# Patient Record
Sex: Female | Born: 1965 | Race: White | Hispanic: No | Marital: Married | Smoking: Former smoker
Health system: Southern US, Community
[De-identification: ages and names within clinical notes are randomized; demographics above are authoritative.]

## PROBLEM LIST (undated history)

## (undated) DIAGNOSIS — F419 Anxiety disorder, unspecified: Secondary | ICD-10-CM

## (undated) DIAGNOSIS — K219 Gastro-esophageal reflux disease without esophagitis: Secondary | ICD-10-CM

## (undated) DIAGNOSIS — I739 Peripheral vascular disease, unspecified: Secondary | ICD-10-CM

## (undated) DIAGNOSIS — I2510 Atherosclerotic heart disease of native coronary artery without angina pectoris: Secondary | ICD-10-CM

## (undated) DIAGNOSIS — E119 Type 2 diabetes mellitus without complications: Secondary | ICD-10-CM

## (undated) DIAGNOSIS — I509 Heart failure, unspecified: Secondary | ICD-10-CM

## (undated) DIAGNOSIS — Z87442 Personal history of urinary calculi: Secondary | ICD-10-CM

## (undated) HISTORY — PX: TONSILLECTOMY: SUR1361

## (undated) HISTORY — PX: CORONARY ANGIOPLASTY WITH STENT PLACEMENT: SHX49

## (undated) HISTORY — PX: SALPINGOOPHORECTOMY: SHX82

## (undated) HISTORY — PX: TUBAL LIGATION: SHX77

## (undated) HISTORY — PX: KIDNEY STONE SURGERY: SHX686

## (undated) HISTORY — PX: BACK SURGERY: SHX140

---

## 1999-04-11 ENCOUNTER — Ambulatory Visit (HOSPITAL_COMMUNITY): Admission: RE | Admit: 1999-04-11 | Discharge: 1999-04-11 | Payer: Self-pay | Admitting: Neurosurgery

## 1999-04-11 ENCOUNTER — Encounter: Payer: Self-pay | Admitting: Neurosurgery

## 1999-04-27 ENCOUNTER — Encounter: Payer: Self-pay | Admitting: Neurosurgery

## 1999-04-27 ENCOUNTER — Inpatient Hospital Stay (HOSPITAL_COMMUNITY): Admission: RE | Admit: 1999-04-27 | Discharge: 1999-04-28 | Payer: Self-pay | Admitting: Neurosurgery

## 1999-05-23 ENCOUNTER — Encounter: Admission: RE | Admit: 1999-05-23 | Discharge: 1999-05-23 | Payer: Self-pay | Admitting: Urology

## 1999-05-23 ENCOUNTER — Encounter: Payer: Self-pay | Admitting: Urology

## 1999-06-13 ENCOUNTER — Inpatient Hospital Stay (HOSPITAL_COMMUNITY): Admission: RE | Admit: 1999-06-13 | Discharge: 1999-06-14 | Payer: Self-pay | Admitting: Urology

## 1999-06-13 ENCOUNTER — Encounter: Payer: Self-pay | Admitting: Urology

## 1999-06-14 ENCOUNTER — Encounter: Payer: Self-pay | Admitting: Urology

## 1999-08-09 ENCOUNTER — Encounter: Admission: RE | Admit: 1999-08-09 | Discharge: 1999-08-09 | Payer: Self-pay | Admitting: Urology

## 1999-08-09 ENCOUNTER — Encounter: Payer: Self-pay | Admitting: Urology

## 1999-11-19 ENCOUNTER — Encounter: Admission: RE | Admit: 1999-11-19 | Discharge: 1999-11-19 | Payer: Self-pay | Admitting: Urology

## 1999-11-19 ENCOUNTER — Encounter: Payer: Self-pay | Admitting: Urology

## 1999-12-06 ENCOUNTER — Encounter: Payer: Self-pay | Admitting: Urology

## 1999-12-06 ENCOUNTER — Ambulatory Visit (HOSPITAL_COMMUNITY): Admission: RE | Admit: 1999-12-06 | Discharge: 1999-12-06 | Payer: Self-pay | Admitting: Urology

## 1999-12-21 ENCOUNTER — Encounter: Admission: RE | Admit: 1999-12-21 | Discharge: 1999-12-21 | Payer: Self-pay | Admitting: Urology

## 1999-12-21 ENCOUNTER — Encounter: Payer: Self-pay | Admitting: Urology

## 2000-03-24 ENCOUNTER — Encounter: Payer: Self-pay | Admitting: Urology

## 2000-03-24 ENCOUNTER — Encounter: Admission: RE | Admit: 2000-03-24 | Discharge: 2000-03-24 | Payer: Self-pay | Admitting: Urology

## 2002-04-20 ENCOUNTER — Inpatient Hospital Stay (HOSPITAL_COMMUNITY): Admission: EM | Admit: 2002-04-20 | Discharge: 2002-04-21 | Payer: Self-pay | Admitting: Cardiology

## 2002-06-17 ENCOUNTER — Encounter: Payer: Self-pay | Admitting: Cardiology

## 2002-06-17 ENCOUNTER — Inpatient Hospital Stay (HOSPITAL_COMMUNITY): Admission: AD | Admit: 2002-06-17 | Discharge: 2002-06-21 | Payer: Self-pay | Admitting: Cardiology

## 2002-09-01 ENCOUNTER — Encounter: Payer: Self-pay | Admitting: Emergency Medicine

## 2002-09-01 ENCOUNTER — Emergency Department (HOSPITAL_COMMUNITY): Admission: EM | Admit: 2002-09-01 | Discharge: 2002-09-01 | Payer: Self-pay | Admitting: Emergency Medicine

## 2002-12-28 ENCOUNTER — Encounter: Payer: Self-pay | Admitting: Emergency Medicine

## 2002-12-28 ENCOUNTER — Emergency Department (HOSPITAL_COMMUNITY): Admission: EM | Admit: 2002-12-28 | Discharge: 2002-12-28 | Payer: Self-pay | Admitting: Emergency Medicine

## 2008-06-06 ENCOUNTER — Encounter
Admission: RE | Admit: 2008-06-06 | Discharge: 2008-06-06 | Payer: Self-pay | Admitting: Physical Medicine and Rehabilitation

## 2008-07-04 ENCOUNTER — Encounter
Admission: RE | Admit: 2008-07-04 | Discharge: 2008-07-04 | Payer: Self-pay | Admitting: Physical Medicine and Rehabilitation

## 2010-08-31 NOTE — Cardiovascular Report (Signed)
NAME:  Isabel [name], HUBERS                          ACCOUNT NO.:  [id number]   MEDICAL RECORD NO.:  [id number]                   PATIENT TYPE:  INP   LOCATION:  2008                                 FACILITY:  [facility]   PHYSICIAN:  Veneda Melter, M.D. [facility]               DATE OF BIRTH:  Mar 19, 1966   DATE OF PROCEDURE:  06/18/2002  DATE OF DISCHARGE:                              CARDIAC CATHETERIZATION   PROCEDURE PERFORMED:  1. Left heart catheterization.  2. Left ventriculogram.  3. Selective coronary angiography.   DIAGNOSES:  1. Coronary atherosclerotic disease.  2. Patent stent in the proximal left anterior descending artery.  3. Normal left ventricular systolic function.   HISTORY:  The patient is a 45 year old white female with aggressive coronary  artery disease who has recently undergone coronary artery intervention with  placement of 2 stents in the proximal LAD in January 2004.  The patient  presents now with recurrent substernal chest discomfort.  This is somewhat  different than her prior pain; however, stress imaging study was performed  showing mild ischemia in the mid anterior septal wall.  She was referred for  further assessment.   TECHNIQUE:  Informed consent was obtained.  The patient was brought to the  catheterization lab.  A 6-French sheath was placed in the right femoral  artery using the modified Seldinger technique.  JL4 and JR4 6-French  catheters were then used to engage the left and right coronary arteries, and  selective angiography was performed in various projections using manual  injections of contrast.  A 6-French pigtail catheter was advanced to the  left ventricle, and left ventriculogram performed using power injections of  contrast.  At the termination of the case, the catheters and sheath were  removed, and manual pressure applied until adequate hemostasis was achieved.  The patient tolerated the procedure well and was transferred to the floor in  stable condition.  Findings are as follows:   FINDINGS:  1. Left main trunk:  Medium caliber vessel with mild irregularities.  2. LAD:  This is a medium caliber vessel that provides a diagonal branch in     the mid section.  The LAD has ostial narrowing of 30%.  There is then     evidence of 2 stents in the proximal segment encompassing a septal     perforator and extending up to the diagonal branch.  There is mild waist     within the proximal and distal segments of the stent of not greater than     20%.  The diagonal branches of the distal LAD have mild irregularities.     The small septal perforator has a pinch at its origin of approximately     60%, but it appears to be filling adequately with TIMI grade 3 flow.  3. Left circumflex artery:  This is a small caliber vessel that provides a  marginal branch in the mid section.  The left circumflex system has mild     narrowing of 30% at the takeoff of the marginal branch.  4. Right coronary:  Dominant.  This is a medium caliber vessel that provides     a posterior descending artery and posterior ventricular branch in the     terminal segment.  The right coronary artery has diffuse disease of 30%     to 40% in the mid section.  5. LV:  Normal end systolic and end diastolic dimensions.  Overall left     ventricular function is well preserved.  Ejection fraction of greater     than 55%.  No mitral regurgitation.    HEMODYNAMICS:  1. LV pressure is 130/0.  2. Aortic is 130/80.  3. LVEDP equals 15.   ASSESSMENT AND PLAN:  The patient is a 45 year old female with coronary  artery disease.  She has patent left anterior descending artery via previous  stent placement and atypical chest pain.  Continued medical therapy will be  pursued.                                               Veneda Melter, M.D. [facility]    NG/MEDQ  D:  06/18/2002  T:  06/19/2002  Job:  [id number]   cc:   [name]  [address]  [place]  [place] [address]   Fax: [phone]   Jonelle Sidle, M.D. [facility]

## 2010-08-31 NOTE — Discharge Summary (Signed)
NAME:  Isabel [name], Isabel [name]                          ACCOUNT NO.:  [id number]   MEDICAL RECORD NO.:  [id number]                   PATIENT TYPE:  INP   LOCATION:  2905                                 FACILITY:  [facility]   PHYSICIAN:  Rollene Rotunda, M.D. [facility]            DATE OF BIRTH:  Dec 02, 1965   DATE OF ADMISSION:  06/17/2002  DATE OF DISCHARGE:  06/21/2002                           DISCHARGE SUMMARY - REFERRING   HISTORY ON ADMISSION:  This is a 45 year old female with a history of  coronary artery disease including unstable angina diagnosed in January of  2004.  At that time, she was transferred from [facility] to [facility], where she had a cardiac catheterization which showed two 90%  proximal lesions of the LAD.  The right coronary artery had a 30% proximal  lesion as well as a 30% distal lesion.  The circumflex was normal.  The  ejection fraction was 65%.  She underwent stenting of both lesions with 0%  residual.  She had a CYPHER 3.5 x 28.0 mm and an Express II Monorail 3.5 x  12.0 mm.  She did relatively well until several weeks ago, when she began  having chest pain.  She was seen in the [place] office by Dr. Rollene Rotunda  and arrangements were made to admit the patient to [facility] for  further evaluation.   PAST MEDICAL HISTORY:  Please see coronary artery disease history as noted  above; the patient also has diabetes mellitus and hyperlipidemia.   ALLERGIES:  No known drug allergies.   PAST SURGICAL HISTORY:  The patient is status post tubal ligation, status  post tumor removed from her uterus, history of surgical resection of renal  calculi, history of lumbar disk surgery.   SOCIAL HISTORY:  The patient is married.  She has children.  She quit  smoking approximately 15 years ago.  She works for [employer].   FAMILY HISTORY:  The patient's father had an MI in his 62s.   HOSPITAL COURSE:  As noted, this patient was admitted to [facility]  for further evaluation of chest pain with known coronary artery disease,  status post percutaneous interventions and stents to the LAD performed in  January of 2004.  The patient underwent cardiac catheterization on June 18, 2002 performed by Dr. Veneda Melter.  The patient was found to have patent  stents of the LAD with normal systolic ejection fraction.  There was other  nonobstructive coronary artery disease noted with 20% and 30% diffuse  lesions.  Continued medical therapy was felt to be indicated.   The patient had some groin problems following the procedure.  She underwent  ultrasound of the groin, which was negative for pseudoaneurysm.  Arrangements were made to discharge the patient on June 21, 2002 in improved  condition.   LABORATORY DATA:  A CBC on the 6th was within  normal limits.  A chemistry  profile on the 4th revealed a BUN of 12, creatinine 0.5, potassium was 3.9,  glucose was 162.  Cardiac enzymes were negative.   A chest x-ray showed no active disease.   An EKG showed normal sinus rhythm with poor R wave progression, rate 83  beats per minute.    DISCHARGE MEDICATIONS:  1. Glucotrol XL 5 mg b.i.d.  2. Glucophage 500 mg b.i.d.  3. Enteric-coated aspirin 81 mg daily.  4. Zocor 40 mg daily.  5. Plavix 75 mg daily.  6. Lopressor 25 mg b.i.d.   ACTIVITY:  The patient was told to avoid any strenuous activity or driving  for two days.  She was told not to lift more than 10 pounds for 1 week.   DIET:  She is to be on a low-salt, low-fat diabetic diet.   SPECIAL DISCHARGE INSTRUCTIONS:  She was told to call the office if she had  any increased pain, swelling or bleeding from her groin.   FOLLOWUP:  She was to see Dr. [name] as needed or as scheduled.  She  was to follow up in the [facility] in [place] in approximately  two weeks; the office will call her for an appointment.   PROBLEM LIST AT TIME OF DISCHARGE:  1. Known coronary  artery disease as documented above with left anterior     descending stents performed in January of 2004.  2. Diabetes mellitus.  3. Hyperlipidemia.  4. Recent chest pain with negative enzymes.  5. Cardiac catheter this admission revealing patent stent and nonobstructive     coronary artery disease with normal left ventricular function, continued     medical therapy recommended.     Delton See, P.A. [facility]                  Rollene Rotunda, M.D. [facility]    DR/MEDQ  D:  06/21/2002  T:  06/21/2002  Job:  [id number]   cc:   [name]  [address]  [place]  [place] [address]  Fax: [phone]   [address] [place]  [address] [facility]

## 2010-08-31 NOTE — Discharge Summary (Signed)
NAME:  Isabel [name], Isabel [name] NO.:  [id number]   MEDICAL RECORD NO.:  [id number]                   PATIENT TYPE:  INP   LOCATION:  6527                                 FACILITY:  [facility]   PHYSICIAN:  Jonelle Sidle, M.D. [facility]        DATE OF BIRTH:  07-Jan-1966   DATE OF ADMISSION:  04/20/2002  DATE OF DISCHARGE:                           DISCHARGE SUMMARY - REFERRING   HISTORY OF PRESENT ILLNESS:  Briefly, this is a 45 year old female who was  seen at [facility] emergency room for evaluation of chest pain.  The  patient has a history of uncontrolled diabetes X2 years as well as untreated  hyperlipidemia.  She has been noncompliant.  She has a positive family  history of premature coronary artery disease as well as a remote tobacco  history.  Initial cardiac enzymes were found to be negative although her  glucose was elevated at 344.  She was seen in consultation by Dr.  Diona Browner  and arrangements were made to transfer the patient to [facility] for further evaluation of chest pain.   PAST MEDICAL HISTORY:  As noted, the patient has a history of untreated  hyperlipidemia, history of diabetes mellitus which has not been well  controlled with diet, positive family history of coronary artery disease.  The patient quit smoking 12 years ago but smoked approximately 15 years  prior to that.  She is overweight and gets no regular exercise.   ALLERGIES:  No known drug allergies.   SOCIAL HISTORY:  The patient is married.  She has two children.  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  She quit smoking 12 years ago.  She smoked one pack per day for 15 years.  She drinks alcohol socially.   FAMILY HISTORY:  The patient's father had a myocardial infarction in his  40's.   HOSPITAL COURSE:  As noted, this patient was transferred to [facility] for further evaluation of chest  pain after being seen in  the emergency department at [facility].  Initial cardiac enzymes were  negative.  The patient underwent cardiac catheterization  on the day of  admission April 20, 2002, performed by Dr. Samule Ohm.  The patient was found  to have two 90% proximal lesions in the left anterior descending.  The  circumflex was essentially normal.  The right coronary artery had 30%  proximal lesion as well as a 30% distal  lesion.  Ejection fraction was  estimated to be 65% with no regional wall motion abnormalities and no MR. No  AS, no MS.  The patient underwent stenting of the proximal left anterior  descending lesions; both 90% lesions were reduced to 0.  The patient  tolerated this well.  Arrangements were made to discharge the patient on  April 21, 2002 in improved and stable condition.   During the  patient's stay, her glucose was noted to be elevated in the 300  to 400 range.  As noted the patient has a previous history of diabetes  mellitus which had been controlled through diet, however, it is believed the  patient has been noncompliant and there has not been adequate control.  She  was started on Glucotrol prior to discharge from the hospital and asked to  follow up with her primary care physician as soon as possible for further  treatment of her diabetes.  She will also be referred for outpatient  diabetic teaching.   LABORATORY DATA:  A CBC on April 21, 2002 revealed hemoglobin 13.7,  hematocrit 39.4, white blood cell count 7,000, platelet count 202,000.  Chemistries on April 21, 2002 reveal BUN of 7, creatinine 0.4, potassium  3.8, sodium 134, glucose 325.  Cardiac enzymes were negative.  Hemoglobin  A1C is currently pending.  A lipid profile is pending.  INR was 1.0.  A TSH  at [facility] was 1.10.  A chemistry profile at [facility] revealed BUN  of 9, creatinine 0.4, potassium 3.6, glucose 344.  Cardiac enzymes were  negative.  A lipid profile revealed  cholesterol 376, triglycerides 923.  Apparently chest x-ray was pending at the time of transfer from [facility].   DISCHARGE MEDICATIONS:  1. Zocor 40 mg q.h.s.  2. Lopressor 50 mg b.i.d.  3. Enteric coated aspirin 81 mg daily.  4. Plavix 75 mg daily for three months.  5. Nitroglycerin as needed for chest pain.  6. Glucotrol XL 5 mg each morning.   The patient was on no medications prior to admission.   DISCHARGE INSTRUCTIONS:  Patient was told to avoid any strenuous activity or  driving for at least two days.   DIET:  Patient is to be on a low salt, low fat, diabetic diet.   FOLLOW UP:  Patient is told to call the [place] office for any increased  pain, swelling or bleeding of her groin.  She is to have a fasting lipid  profile and liver function tests in six to eight weeks.  She is to follow up  with the [facility] April 29, 2002, Thursday, at 2 PM.  The patient  was told to call Dr. Dimas Aguas for an appointment as soon as possible for  follow up of her diabetes.  She is to have outpatient diabetic teaching.   PROBLEM LIST AT TIME OF DISCHARGE:  1. Coronary artery disease, status post stents X2 to the left anterior     descending performed April 20, 2002 with minimal residual coronary     artery disease and ejection fraction of 65%.  2. Diabetes mellitus untreated prior to admission.  3. Hyperlipidemia, untreated prior to admission.  4. Remote tobacco history.  5. Positive family history of coronary artery disease.  6. Status post multiple surgeries.  7. History of noncompliance.     Delton See, P.A. [facility]                  Jonelle Sidle, M.D. [facility]    DR/MEDQ  D:  04/21/2002  T:  04/21/2002  Job:  [id number]   cc:   [facility]  [address]  [address]  [place], [place] [address]   [name]  [address]  [place]  [place] [address]  Fax: [phone]

## 2010-08-31 NOTE — Op Note (Signed)
[place]  Patient:    Isabel [name]                          MRN: [id number] Proc. Date: 04/27/99 Adm. 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  PROCEDURE:  Left L5 hemilaminectomy with removal of free fragment and total diskectomy.  Decompression of the S1 nerve root.  SURGEON:  Tanya Nones. Jeral Fruit, M.D.  ASSISTANT:  Alanson Aly. Roxan Hockey, M.D.  CLINICAL HISTORY:  The patient was seen by me a month ago because of back and left leg pain.  She has failed conservative treatment.  MRI show for a large herniated disk at the level 5-1 on the left.  This patient has six disk space and the ______  of the level of the second from the bottom up.  This will be called L5-S1. Patient knew of the risks such as infection, CSF leak, worsening of the pain, paralysis, need for further surgery.  PROCEDURE:  The patient was taken to the OR.  She was positioned in a prone manner. The back was prepped with Betadine.  Because of her obesity, it was difficult to palpate the spinous process.  Nevertheless, we did our incision, which carried own to the spine and muscle and fascia were dissected out laterally.  By x-ray, we new that she has a fragment going into below to S1.  We proceeded with removal of hemilamina of L5 after we were able to see the dural sac.  The S1 nerve root was displaced down and medially.  Retraction was done.  There was quite a bit of adhesion between the S1 nerve root and the disk space.  Finally, dissection was  done and we were able to mobilize the dural sac.  Incision was made in the disk  space and four large free fragment of disk were removed.  Then, we entered into the disk space and total diskectomy was done.  Investigation in the  upper part and he inferior part of the nerve root especially at the level of the axilla was negative. From then on, the area was irrigated.  There was no evidence of any CSF leak.  Depo Medrol and fentanyl were left in the epidural space and the wound was closed with Vicryl and Steri-Strips. DD:  04/27/99 TD:  04/27/99 Job: [id number]

## 2013-06-02 ENCOUNTER — Encounter (HOSPITAL_COMMUNITY): Payer: Self-pay | Admitting: Emergency Medicine

## 2013-06-02 ENCOUNTER — Inpatient Hospital Stay (HOSPITAL_COMMUNITY)
Admission: EM | Admit: 2013-06-02 | Discharge: 2013-06-18 | DRG: 234 | Disposition: A | Discharge status: Home / Self Care | Payer: BC Managed Care – PPO | Attending: Cardiothoracic Surgery | Admitting: Cardiothoracic Surgery

## 2013-06-02 ENCOUNTER — Emergency Department (HOSPITAL_COMMUNITY): Payer: BC Managed Care – PPO

## 2013-06-02 DIAGNOSIS — IMO0002 Reserved for concepts with insufficient information to code with codable children: Secondary | ICD-10-CM | POA: Diagnosis present

## 2013-06-02 DIAGNOSIS — D649 Anemia, unspecified: Secondary | ICD-10-CM | POA: Diagnosis present

## 2013-06-02 DIAGNOSIS — D62 Acute posthemorrhagic anemia: Secondary | ICD-10-CM | POA: Diagnosis not present

## 2013-06-02 DIAGNOSIS — I319 Disease of pericardium, unspecified: Secondary | ICD-10-CM | POA: Diagnosis present

## 2013-06-02 DIAGNOSIS — Z823 Family history of stroke: Secondary | ICD-10-CM

## 2013-06-02 DIAGNOSIS — Z951 Presence of aortocoronary bypass graft: Secondary | ICD-10-CM

## 2013-06-02 DIAGNOSIS — J9819 Other pulmonary collapse: Secondary | ICD-10-CM | POA: Diagnosis not present

## 2013-06-02 DIAGNOSIS — K5900 Constipation, unspecified: Secondary | ICD-10-CM | POA: Diagnosis present

## 2013-06-02 DIAGNOSIS — I5021 Acute systolic (congestive) heart failure: Principal | ICD-10-CM | POA: Diagnosis present

## 2013-06-02 DIAGNOSIS — I2589 Other forms of chronic ischemic heart disease: Secondary | ICD-10-CM | POA: Diagnosis present

## 2013-06-02 DIAGNOSIS — Z6828 Body mass index (BMI) 28.0-28.9, adult: Secondary | ICD-10-CM

## 2013-06-02 DIAGNOSIS — I10 Essential (primary) hypertension: Secondary | ICD-10-CM | POA: Diagnosis present

## 2013-06-02 DIAGNOSIS — I359 Nonrheumatic aortic valve disorder, unspecified: Secondary | ICD-10-CM | POA: Diagnosis present

## 2013-06-02 DIAGNOSIS — I2510 Atherosclerotic heart disease of native coronary artery without angina pectoris: Secondary | ICD-10-CM | POA: Diagnosis present

## 2013-06-02 DIAGNOSIS — E1149 Type 2 diabetes mellitus with other diabetic neurological complication: Secondary | ICD-10-CM | POA: Diagnosis present

## 2013-06-02 DIAGNOSIS — I255 Ischemic cardiomyopathy: Secondary | ICD-10-CM

## 2013-06-02 DIAGNOSIS — E785 Hyperlipidemia, unspecified: Secondary | ICD-10-CM | POA: Diagnosis present

## 2013-06-02 DIAGNOSIS — E669 Obesity, unspecified: Secondary | ICD-10-CM | POA: Diagnosis present

## 2013-06-02 DIAGNOSIS — R160 Hepatomegaly, not elsewhere classified: Secondary | ICD-10-CM | POA: Diagnosis present

## 2013-06-02 DIAGNOSIS — I959 Hypotension, unspecified: Secondary | ICD-10-CM | POA: Diagnosis present

## 2013-06-02 DIAGNOSIS — Z87891 Personal history of nicotine dependence: Secondary | ICD-10-CM

## 2013-06-02 DIAGNOSIS — R739 Hyperglycemia, unspecified: Secondary | ICD-10-CM

## 2013-06-02 DIAGNOSIS — Z7982 Long term (current) use of aspirin: Secondary | ICD-10-CM

## 2013-06-02 DIAGNOSIS — Z79899 Other long term (current) drug therapy: Secondary | ICD-10-CM

## 2013-06-02 DIAGNOSIS — I350 Nonrheumatic aortic (valve) stenosis: Secondary | ICD-10-CM

## 2013-06-02 DIAGNOSIS — R609 Edema, unspecified: Secondary | ICD-10-CM

## 2013-06-02 DIAGNOSIS — E1165 Type 2 diabetes mellitus with hyperglycemia: Secondary | ICD-10-CM | POA: Diagnosis present

## 2013-06-02 DIAGNOSIS — T82897A Other specified complication of cardiac prosthetic devices, implants and grafts, initial encounter: Secondary | ICD-10-CM | POA: Diagnosis present

## 2013-06-02 DIAGNOSIS — Y831 Surgical operation with implant of artificial internal device as the cause of abnormal reaction of the patient, or of later complication, without mention of misadventure at the time of the procedure: Secondary | ICD-10-CM | POA: Diagnosis present

## 2013-06-02 DIAGNOSIS — E1142 Type 2 diabetes mellitus with diabetic polyneuropathy: Secondary | ICD-10-CM | POA: Diagnosis present

## 2013-06-02 DIAGNOSIS — I679 Cerebrovascular disease, unspecified: Secondary | ICD-10-CM | POA: Diagnosis present

## 2013-06-02 DIAGNOSIS — F411 Generalized anxiety disorder: Secondary | ICD-10-CM | POA: Diagnosis present

## 2013-06-02 DIAGNOSIS — Z8249 Family history of ischemic heart disease and other diseases of the circulatory system: Secondary | ICD-10-CM

## 2013-06-02 DIAGNOSIS — I509 Heart failure, unspecified: Secondary | ICD-10-CM

## 2013-06-02 DIAGNOSIS — E871 Hypo-osmolality and hyponatremia: Secondary | ICD-10-CM | POA: Diagnosis present

## 2013-06-02 HISTORY — DX: Heart failure, unspecified: I50.9

## 2013-06-02 HISTORY — DX: Type 2 diabetes mellitus without complications: E11.9

## 2013-06-02 HISTORY — DX: Atherosclerotic heart disease of native coronary artery without angina pectoris: I25.10

## 2013-06-02 LAB — BASIC METABOLIC PANEL
BUN: 21 mg/dL (ref 6–23)
CALCIUM: 9.5 mg/dL (ref 8.4–10.5)
CHLORIDE: 96 meq/L (ref 96–112)
CO2: 27 mEq/L (ref 19–32)
CREATININE: 0.64 mg/dL (ref 0.50–1.10)
GFR calc non Af Amer: >90 mL/min (ref >90)
Glucose, Bld: 393 mg/dL — ABNORMAL HIGH (ref 70–99)
Potassium: 5 mEq/L (ref 3.7–5.3)
Sodium: 135 mEq/L — ABNORMAL LOW (ref 137–147)

## 2013-06-02 LAB — CBC WITH DIFFERENTIAL/PLATELET
BASOS PCT: 1 % (ref 0–1)
Basophils Absolute: 0 10*3/uL (ref 0.0–0.1)
EOS PCT: 4 % (ref 0–5)
Eosinophils Absolute: 0.3 10*3/uL (ref 0.0–0.7)
HEMATOCRIT: 35.1 % — AB (ref 36.0–46.0)
HEMOGLOBIN: 11.9 g/dL — AB (ref 12.0–15.0)
Lymphocytes Relative: 23 % (ref 12–46)
Lymphs Abs: 1.6 10*3/uL (ref 0.7–4.0)
MCH: 30.9 pg (ref 26.0–34.0)
MCHC: 33.9 g/dL (ref 30.0–36.0)
MCV: 91.2 fL (ref 78.0–100.0)
MONO ABS: 0.4 10*3/uL (ref 0.1–1.0)
MONOS PCT: 5 % (ref 3–12)
NEUTROS ABS: 4.7 10*3/uL (ref 1.7–7.7)
Neutrophils Relative %: 67 % (ref 43–77)
Platelets: 204 10*3/uL (ref 150–400)
RBC: 3.85 MIL/uL — ABNORMAL LOW (ref 3.87–5.11)
RDW: 13.9 % (ref 11.5–15.5)
WBC: 7 10*3/uL (ref 4.0–10.5)

## 2013-06-02 LAB — GLUCOSE, CAPILLARY
GLUCOSE-CAPILLARY: 339 mg/dL — AB (ref 70–99)
Glucose-Capillary: 397 mg/dL — ABNORMAL HIGH (ref 70–99)
Glucose-Capillary: 276 mg/dL — ABNORMAL HIGH (ref 70–99)

## 2013-06-02 LAB — PRO B NATRIURETIC PEPTIDE: Pro B Natriuretic peptide (BNP): 3307 pg/mL — ABNORMAL HIGH (ref 0–125)

## 2013-06-02 LAB — TROPONIN I: Troponin I: <0.3 ng/mL (ref <0.30)

## 2013-06-02 MED ORDER — POLYETHYLENE GLYCOL 3350 17 G PO PACK
17.0000 g | PACK | Freq: Two times a day (BID) | ORAL | Status: DC
  Administered 2013-06-02 – 2013-06-05 (×5): 17 g via ORAL
  Filled 2013-06-02 (×13): qty 1

## 2013-06-02 MED ORDER — HYDROCHLOROTHIAZIDE 12.5 MG PO CAPS
12.5000 mg | ORAL_CAPSULE | Freq: Every day | ORAL | Status: DC
  Filled 2013-06-02: qty 1

## 2013-06-02 MED ORDER — FUROSEMIDE 10 MG/ML IJ SOLN
40.0000 mg | Freq: Once | INTRAMUSCULAR | Status: AC
  Administered 2013-06-02: 40 mg via INTRAVENOUS
  Filled 2013-06-02: qty 4

## 2013-06-02 MED ORDER — MILK AND MOLASSES ENEMA
1.0000 | Freq: Once | RECTAL | Status: AC
  Administered 2013-06-02: 250 mL via RECTAL

## 2013-06-02 MED ORDER — INSULIN GLARGINE 100 UNIT/ML ~~LOC~~ SOLN
10.0000 [IU] | Freq: Every day | SUBCUTANEOUS | Status: DC
  Administered 2013-06-02: 10 [IU] via SUBCUTANEOUS
  Filled 2013-06-02 (×2): qty 0.1

## 2013-06-02 MED ORDER — INSULIN ASPART 100 UNIT/ML ~~LOC~~ SOLN
0.0000 [IU] | Freq: Three times a day (TID) | SUBCUTANEOUS | Status: DC
  Administered 2013-06-03 (×2): 5 [IU] via SUBCUTANEOUS

## 2013-06-02 MED ORDER — METOPROLOL TARTRATE 50 MG PO TABS
50.0000 mg | ORAL_TABLET | Freq: Two times a day (BID) | ORAL | Status: DC
  Administered 2013-06-02: 50 mg via ORAL
  Filled 2013-06-02 (×2): qty 1

## 2013-06-02 MED ORDER — ALBUTEROL SULFATE (2.5 MG/3ML) 0.083% IN NEBU: 2.5000 mg | INHALATION_SOLUTION | RESPIRATORY_TRACT | Status: DC | PRN

## 2013-06-02 MED ORDER — ONDANSETRON HCL 4 MG PO TABS: 4.0000 mg | ORAL_TABLET | Freq: Four times a day (QID) | ORAL | Status: DC | PRN

## 2013-06-02 MED ORDER — ONDANSETRON HCL 4 MG/2ML IJ SOLN: 4.0000 mg | Freq: Four times a day (QID) | INTRAMUSCULAR | Status: DC | PRN

## 2013-06-02 MED ORDER — SENNA 8.6 MG PO TABS
2.0000 | ORAL_TABLET | Freq: Two times a day (BID) | ORAL | Status: DC
Start: 2013-06-02 — End: 2013-06-08
  Administered 2013-06-02 – 2013-06-06 (×5): 17.2 mg via ORAL
  Filled 2013-06-02 (×13): qty 2

## 2013-06-02 MED ORDER — INSULIN ASPART 100 UNIT/ML ~~LOC~~ SOLN
0.0000 [IU] | Freq: Every day | SUBCUTANEOUS | Status: DC
  Administered 2013-06-02: 4 [IU] via SUBCUTANEOUS

## 2013-06-02 MED ORDER — ENOXAPARIN SODIUM 40 MG/0.4ML ~~LOC~~ SOLN
40.0000 mg | SUBCUTANEOUS | Status: DC
  Administered 2013-06-02 – 2013-06-06 (×5): 40 mg via SUBCUTANEOUS
  Filled 2013-06-02 (×6): qty 0.4

## 2013-06-02 MED ORDER — ASPIRIN EC 81 MG PO TBEC
81.0000 mg | DELAYED_RELEASE_TABLET | Freq: Every day | ORAL | Status: DC
  Administered 2013-06-02 – 2013-06-06 (×5): 81 mg via ORAL
  Filled 2013-06-02 (×7): qty 1

## 2013-06-02 MED ORDER — ACETAMINOPHEN 325 MG PO TABS
650.0000 mg | ORAL_TABLET | Freq: Four times a day (QID) | ORAL | Status: DC | PRN
  Administered 2013-06-04: 650 mg via ORAL
  Filled 2013-06-02: qty 2

## 2013-06-02 MED ORDER — LISINOPRIL 10 MG PO TABS
10.0000 mg | ORAL_TABLET | Freq: Every day | ORAL | Status: DC
  Filled 2013-06-02: qty 1

## 2013-06-02 MED ORDER — FUROSEMIDE 10 MG/ML IJ SOLN
40.0000 mg | Freq: Two times a day (BID) | INTRAMUSCULAR | Status: DC
  Administered 2013-06-03 – 2013-06-05 (×5): 40 mg via INTRAVENOUS
  Filled 2013-06-02 (×7): qty 4

## 2013-06-02 MED ORDER — LISINOPRIL-HYDROCHLOROTHIAZIDE 10-12.5 MG PO TABS: 1.0000 | ORAL_TABLET | Freq: Every day | ORAL | Status: DC

## 2013-06-02 MED ORDER — ACETAMINOPHEN 650 MG RE SUPP: 650.0000 mg | Freq: Four times a day (QID) | RECTAL | Status: DC | PRN

## 2013-06-02 MED ORDER — SODIUM CHLORIDE 0.9 % IJ SOLN
3.0000 mL | Freq: Two times a day (BID) | INTRAMUSCULAR | Status: DC
Start: 2013-06-02 — End: 2013-06-08
  Administered 2013-06-02 – 2013-06-07 (×10): 3 mL via INTRAVENOUS

## 2013-06-02 MED ORDER — ALPRAZOLAM 0.5 MG PO TABS
1.0000 mg | ORAL_TABLET | Freq: Four times a day (QID) | ORAL | Status: DC | PRN
  Administered 2013-06-02 – 2013-06-08 (×3): 1 mg via ORAL
  Filled 2013-06-02: qty 2
  Filled 2013-06-02 (×2): qty 1

## 2013-06-02 NOTE — H&P (Signed)
History and Physical  Isabel [name]:[id number] DOB: 1965/08/30 DOA: 06/02/2013  Referring physician: EDP PCP: [name], MD  Outpatient Specialists:  1. None  Chief Complaint: Constipation and difficulty breathing  HPI: Isabel [name] is a 48 y.o. female with history of CAD, status post stents 2004, type II DM, former smoker, presented to the ED with complaints of constipation and difficulty breathing. She gives approximately 2 weeks history of generally feeling unwell, abdominal bloating and constipation. She tried oral Dulcolax without success. 4-5 days back, she had couple episodes of nonbloody emesis. She tried a rectal suppository 3 days ago without success. She saw her PCP 2 days back and was started on MiraLAX without changes. She has been passing flatus. No further emesis. Denies abdominal pain. She noticed progressively worsening orthopnea for the last 3-4 days. She used to use one pillow and now uses 4 pillows to lay down. She denies chest pain. She has chronic bilateral leg edema which has not changed. She has minimal dry cough. She feels hot and cold but no fevers or chills. In the ED, sodium 135, glucose 393, hemoglobin 11.9, troponin less than proBNP 06/15/2005, chest x-ray suggestive of congestive heart failure and abdominal x-ray shows unremarkable bowel gas pattern and hepatomegaly. She was given a dose of IV Lasix for CHF-states that has not made much difference. Hospitalist admission requested.   Review of Systems: All systems reviewed and apart from history of presenting illness, are negative.  Past Medical History  Diagnosis Date  . Coronary artery disease   . Diabetes mellitus without complication   . Renal disorder     kidney stones   Past Surgical History  Procedure Laterality Date  . Cardiac stents    . Back surgery    . Kidney stone surgery    . Fallopian tube and 1 ovary removed     Social History:  reports that she has quit smoking. She does not  have any smokeless tobacco history on file. She reports that she drinks alcohol. She reports that she does not use illicit drugs. Married. Independent of activities of daily living.  No Known Allergies  Family History  Problem Relation Age of Onset  . Heart disease Mother   . Cancer Father   . Heart disease Father   . Heart disease Brother     Prior to Admission medications   Medication Sig Start Date End Date Taking? Authorizing Provider  ALPRAZolam Prudy Feeler) 1 MG tablet Take 1 mg by mouth 4 (four) times daily as needed. 05/06/13  Yes Historical Provider, MD  aspirin EC 81 MG tablet Take 81 mg by mouth daily.   Yes Historical Provider, MD  BYETTA 10 MCG PEN 10 MCG/0.04ML SOPN injection Inject 10 mcg into the skin 2 (two) times daily. 04/12/13  Yes Historical Provider, MD  GLIPIZIDE XL 10 MG 24 hr tablet Take 10 mg by mouth 2 (two) times daily. 04/12/13  Yes Historical Provider, MD  lisinopril-hydrochlorothiazide (PRINZIDE,ZESTORETIC) 10-12.5 MG per tablet Take 1 tablet by mouth daily. 04/12/13  Yes Historical Provider, MD  metoprolol (LOPRESSOR) 50 MG tablet Take 50 mg by mouth 2 (two) times daily.   Yes Historical Provider, MD   Physical Exam: Filed Vitals:   06/02/13 1455 06/02/13 1621 06/02/13 1700 06/02/13 1734  BP: 107/64 100/58  101/62  Pulse: 81 83  82  Temp:      TempSrc:      Resp:  20  18  Height:   5'  2" (1.575 m)   Weight:   68 kg (149 lb 14.6 oz)   SpO2: 94% 93%  92%     General exam: Moderately built and nourished young female patient, lying comfortably propped up on the gurney in no obvious distress.  Head, eyes and ENT: Nontraumatic and normocephalic. Pupils equally reacting to light and accommodation. Oral mucosa moist.  Neck: Supple. No JVD, carotid bruit or thyromegaly.  Lymphatics: No lymphadenopathy.  Respiratory system: Reduced breath sounds in the bases with bibasal fine crackles. Rest of lung fields are clear to auscultation. No increased work of  breathing. Able to speak in full sentences  Cardiovascular system: S1 and S2 heard, RRR. No JVD, murmurs, gallops, clicks. 1+ bilateral leg edema.  Gastrointestinal system: Abdomen is mildly distended but soft and nontender. Normal bowel sounds heard. No organomegaly or masses appreciated.  Central nervous system: Alert and oriented. No focal neurological deficits.  Extremities: Symmetric 5 x 5 power. Peripheral pulses symmetrically felt.   Skin: No rashes or acute findings.  Musculoskeletal system: Negative exam.  Psychiatry: Pleasant and cooperative.   Labs on Admission:  Basic Metabolic Panel:  Recent Labs Lab 06/02/13 1506  NA 135*  K 5.0  CL 96  CO2 27  GLUCOSE 393*  BUN 21  CREATININE 0.64  CALCIUM 9.5   Liver Function Tests: No results found for this basename: AST, ALT, ALKPHOS, BILITOT, PROT, ALBUMIN,  in the last 168 hours No results found for this basename: LIPASE, AMYLASE,  in the last 168 hours No results found for this basename: AMMONIA,  in the last 168 hours CBC:  Recent Labs Lab 06/02/13 1506  WBC 7.0  NEUTROABS 4.7  HGB 11.9*  HCT 35.1*  MCV 91.2  PLT 204   Cardiac Enzymes:  Recent Labs Lab 06/02/13 1506  TROPONINI <0.30    BNP (last 3 results)  Recent Labs  06/02/13 1506  PROBNP 3307.0*   CBG:  Recent Labs Lab 06/02/13 1253  GLUCAP 397*    Radiological Exams on Admission: Dg Abd Acute W/chest  06/02/2013   CLINICAL DATA:  Difficulty breathing and constipation  EXAM: ACUTE ABDOMEN SERIES (ABDOMEN 2 VIEW & CHEST 1 VIEW)  COMPARISON:  Chest radiograph July 04, 2008  FINDINGS: PA chest: There is cardiomegaly with bibasilar edema and bilateral effusions. There is mild pulmonary venous hypertension. There is mild patchy consolidation in the medial bases bilaterally.  Supine and upright abdomen: There is moderate stool in the colon. The bowel gas pattern is unremarkable. No obstruction or free air. Liver appears enlarged. There is a  surgical clip in the right pelvis. There are areas of vascular calcification.  IMPRESSION: Congestive heart failure.  Bowel gas pattern unremarkable.  No obstruction or free air.  Hepatomegaly.   Electronically Signed   By: Bretta Bang M.D.   On: 06/02/2013 13:42    EKG: Independently reviewed. Normal sinus rhythm at 82 beats per minute, normal axis, T. inversion in lateral leads, Q waves in leads V1-2 and slow R-wave progression. QTC 441 ms.  Assessment/Plan Principal Problem:   Acute CHF Active Problems:   DM (diabetes mellitus), type 2, uncontrolled   CAD (coronary artery disease), status post stents 2004   Anemia   Constipation   1. Acute CHF: Unknown type-no echo in system. Admit to telemetry. Obtain 2-D echo to evaluate LV function. Treat with IV Lasix 40 mg every 12 hourly, strict intake and output and daily weights. Continue ACE inhibitors and beta blockers. Monitor closely. She  denies history of hypertension. 2. Constipation: Milk and molasses enema x1 and will place on bowel regimen including scheduled senna and MiraLAX. 3. Uncontrolled type II DM: Hold home oral medications. Check A1c. Place on Lantus 10 units each bedtime and NovoLog SSI. May need titration. 4. Anemia: Likely chronic. Follow CBCs. 5. Hepatomegaly on abdominal x-ray: Check LFTs and RUQ ultrasound. 6. History of CAD status post stents: Lost to cardiology followup. Asymptomatic of chest pain. No acute changes on EKG and troponin Negative. Continue Aspirin and Beta Blockers. 7. History of Anxiety: Continue Home Dose of Xanax When Necessary.     Code Status: Full  Family Communication: Discussed with patient's son at bedside  Disposition Plan: Home in medically stable   Time spent: 60 minutes  HONGALGI,ANAND, MD, FACP, FHM. [facility] Pager [phone]  If 7PM-7AM, please contact night-coverage [email or web address] Password [email or web address] 06/02/2013, 5:49 PM

## 2013-06-02 NOTE — ED Notes (Signed)
Reports 10lb weight gain over the past couple of weeks.

## 2013-06-02 NOTE — ED Provider Notes (Signed)
CSN: [id number]     Arrival date & time 06/02/13  1242 History  This chart was scribed for Charles B. Bernette Mayers, MD by Leone Payor, ED Scribe. This patient was seen in room APA07/APA07 and the patient's care was started 2:51 PM.    Chief Complaint  Patient presents with  . Shortness of Breath      The history is provided by the patient. No language interpreter was used.    HPI Comments: Isabel [name] is a 48 y.o. female with past medical history of CAD, DM who presents to the Emergency Department complaining of 1 week of constant, gradually worsening SOB. She states this is worse with laying flat and exertion. She also reports having a "squishing" pain when rolling over onto left side. She reports not having a BM for the past 2 weeks. She began to have subjective fever and chills so she went to see her PCP 2 days ago. She expressed concern for the lack of BM's and was advised to take Miralax. She reports having a 10 lb weight gain recently along with abdominal bloating.    Past Medical History  Diagnosis Date  . Coronary artery disease   . Diabetes mellitus without complication   . Renal disorder     kidney stones   Past Surgical History  Procedure Laterality Date  . Cardiac stents    . Back surgery    . Kidney stone surgery    . Fallopian tube and 1 ovary removed     No family history on file. History  Substance Use Topics  . Smoking status: Never Smoker   . Smokeless tobacco: Not on file  . Alcohol Use: Yes     Comment: occ   OB History   Grav Para Term Preterm Abortions TAB SAB Ect Mult Living                 Review of Systems  A complete 10 system review of systems was obtained and all systems are negative except as noted in the HPI and PMH.    Allergies  Review of patient's allergies indicates no known allergies.  Home Medications  No current outpatient prescriptions on file. BP 104/67  Pulse 81  Temp(Src) 98.2 F (36.8 C) (Oral)  Resp 18  Ht 5\' 2"  (1.575  m)  Wt 150 lb (68.04 kg)  BMI 27.43 kg/m2  SpO2 98% Physical Exam  Nursing note and vitals reviewed. Constitutional: She is oriented to person, place, and time. She appears well-developed and well-nourished.  HENT:  Head: Normocephalic and atraumatic.  Eyes: EOM are normal. Pupils are equal, round, and reactive to light.  Neck: Normal range of motion. Neck supple.  Cardiovascular: Normal rate, normal heart sounds and intact distal pulses.   Pulmonary/Chest: Effort normal and breath sounds normal.  Abdominal: Bowel sounds are normal. She exhibits no distension. There is no tenderness.  Musculoskeletal: Normal range of motion. She exhibits edema (1+ edema to BLE). She exhibits no tenderness.  Neurological: She is alert and oriented to person, place, and time. She has normal strength. No cranial nerve deficit or sensory deficit.  Skin: Skin is warm and dry. No rash noted.  Psychiatric: She has a normal mood and affect.    ED Course  Procedures (including critical care time)  DIAGNOSTIC STUDIES: Oxygen Saturation is 98% on RA, normal by my interpretation.    COORDINATION OF CARE: 2:56 PM Discussed treatment plan with pt at bedside and pt agreed to plan.  Labs Review Labs Reviewed  GLUCOSE, CAPILLARY - Abnormal; Notable for the following:    Glucose-Capillary 397 (*)    All other components within normal limits  CBC WITH DIFFERENTIAL  BASIC METABOLIC PANEL  TROPONIN I  PRO B NATRIURETIC PEPTIDE   Imaging Review Dg Abd Acute W/chest  06/02/2013   CLINICAL DATA:  Difficulty breathing and constipation  EXAM: ACUTE ABDOMEN SERIES (ABDOMEN 2 VIEW & CHEST 1 VIEW)  COMPARISON:  Chest radiograph July 04, 2008  FINDINGS: PA chest: There is cardiomegaly with bibasilar edema and bilateral effusions. There is mild pulmonary venous hypertension. There is mild patchy consolidation in the medial bases bilaterally.  Supine and upright abdomen: There is moderate stool in the colon. The bowel gas  pattern is unremarkable. No obstruction or free air. Liver appears enlarged. There is a surgical clip in the right pelvis. There are areas of vascular calcification.  IMPRESSION: Congestive heart failure.  Bowel gas pattern unremarkable.  No obstruction or free air.  Hepatomegaly.   Electronically Signed   By: Bretta BangWilliam  Woodruff M.D.   On: 06/02/2013 13:42    EKG Interpretation    Date/Time:  Wednesday June 02 2013 12:46:17 EST Ventricular Rate:  82 PR Interval:  140 QRS Duration: 88 QT Interval:  378 QTC Calculation: 441 R Axis:   72 Text Interpretation:  Normal sinus rhythm Septal infarct , age undetermined Abnormal ECG When compared with ECG of 28-Dec-2002 11:45, QRS duration has increased Septal infarct is now Present Nonspecific T wave abnormality now evident in Inferior leads Nonspecific T wave abnormality, worse in Lateral leads Confirmed by SHELDON  MD, CHARLES ([id number]) on 06/02/2013 3:00:04 PM            MDM   Final diagnoses:  CHF (congestive heart failure)  Constipation  Hyperglycemia    Pt with xray concerning for pulm edema. Pt has history of CAD, but lost to followup with cardiology years ago. No recent echo. Not taking any diuretics.   I personally performed the services described in this documentation, which was scribed in my presence. The recorded information has been reviewed and is accurate.      Charles B. Bernette MayersSheldon, MD 06/02/13 2107

## 2013-06-02 NOTE — ED Notes (Signed)
Pt c/o feeling sob with exertion and when laying flat.  Reports saw her pcp Monday because had not had a bm for 2 weeks.  Reports has been given miralax but still has had no result.  Reports abd distended.  Denies abd pain or chest pain.

## 2013-06-03 DIAGNOSIS — I319 Disease of pericardium, unspecified: Secondary | ICD-10-CM

## 2013-06-03 LAB — BASIC METABOLIC PANEL
BUN: 27 mg/dL — ABNORMAL HIGH (ref 6–23)
CO2: 27 mEq/L (ref 19–32)
Calcium: 9 mg/dL (ref 8.4–10.5)
Chloride: 97 mEq/L (ref 96–112)
Creatinine, Ser: 0.72 mg/dL (ref 0.50–1.10)
GLUCOSE: 262 mg/dL — AB (ref 70–99)
Potassium: 4.4 mEq/L (ref 3.7–5.3)
Sodium: 136 mEq/L — ABNORMAL LOW (ref 137–147)

## 2013-06-03 LAB — CBC
HCT: 33.7 % — ABNORMAL LOW (ref 36.0–46.0)
HEMOGLOBIN: 11.4 g/dL — AB (ref 12.0–15.0)
MCH: 31 pg (ref 26.0–34.0)
MCHC: 33.8 g/dL (ref 30.0–36.0)
MCV: 91.6 fL (ref 78.0–100.0)
Platelets: 213 10*3/uL (ref 150–400)
RBC: 3.68 MIL/uL — AB (ref 3.87–5.11)
RDW: 14.1 % (ref 11.5–15.5)
WBC: 7.2 10*3/uL (ref 4.0–10.5)

## 2013-06-03 LAB — HEMOGLOBIN A1C
Hgb A1c MFr Bld: 12.4 % — ABNORMAL HIGH (ref <5.7)
Mean Plasma Glucose: 309 mg/dL — ABNORMAL HIGH (ref <117)

## 2013-06-03 LAB — GLUCOSE, CAPILLARY
GLUCOSE-CAPILLARY: 296 mg/dL — AB (ref 70–99)
GLUCOSE-CAPILLARY: 161 mg/dL — AB (ref 70–99)
GLUCOSE-CAPILLARY: 238 mg/dL — AB (ref 70–99)
Glucose-Capillary: 254 mg/dL — ABNORMAL HIGH (ref 70–99)

## 2013-06-03 LAB — HEPATIC FUNCTION PANEL
ALT: 16 U/L (ref 0–35)
AST: 12 U/L (ref 0–37)
Albumin: 3 g/dL — ABNORMAL LOW (ref 3.5–5.2)
Alkaline Phosphatase: 58 U/L (ref 39–117)
BILIRUBIN TOTAL: 0.3 mg/dL (ref 0.3–1.2)
Total Protein: 6.3 g/dL (ref 6.0–8.3)

## 2013-06-03 MED ORDER — INSULIN ASPART 100 UNIT/ML ~~LOC~~ SOLN
0.0000 [IU] | Freq: Three times a day (TID) | SUBCUTANEOUS | Status: DC
  Administered 2013-06-04 (×2): 8 [IU] via SUBCUTANEOUS
  Administered 2013-06-04: 5 [IU] via SUBCUTANEOUS
  Administered 2013-06-05: 11 [IU] via SUBCUTANEOUS
  Administered 2013-06-05: 5 [IU] via SUBCUTANEOUS
  Administered 2013-06-05: 8 [IU] via SUBCUTANEOUS
  Administered 2013-06-06: 3 [IU] via SUBCUTANEOUS
  Administered 2013-06-06: 11 [IU] via SUBCUTANEOUS
  Administered 2013-06-06: 5 [IU] via SUBCUTANEOUS
  Administered 2013-06-07: 3 [IU] via SUBCUTANEOUS

## 2013-06-03 MED ORDER — INSULIN ASPART 100 UNIT/ML ~~LOC~~ SOLN
0.0000 [IU] | Freq: Every day | SUBCUTANEOUS | Status: DC
  Administered 2013-06-03: 2 [IU] via SUBCUTANEOUS
  Administered 2013-06-04 – 2013-06-06 (×2): 3 [IU] via SUBCUTANEOUS
  Administered 2013-06-07: 2 [IU] via SUBCUTANEOUS

## 2013-06-03 MED ORDER — INSULIN GLARGINE 100 UNIT/ML ~~LOC~~ SOLN
15.0000 [IU] | Freq: Every day | SUBCUTANEOUS | Status: DC
  Administered 2013-06-03: 15 [IU] via SUBCUTANEOUS
  Filled 2013-06-03 (×4): qty 0.15

## 2013-06-03 NOTE — Progress Notes (Signed)
INITIAL NUTRITION ASSESSMENT  DOCUMENTATION CODES Per approved criteria  -Not Applicable   INTERVENTION: Continue with current plan of care Follow for PO intake to assess need for addition of nutritional supplement  NUTRITION DIAGNOSIS: Inadequate oral intake related to decreased appetite as evidenced by PO: 10%.   Goal: Pt will meet >90% of estimated nutritional needs  Monitor:  PO intake, labs, weight changes, skin assessments, I/O's  Reason for Assessment: MST=2  48 y.o. female  Admitting Dx: Acute CHF  ASSESSMENT: Pt admitted with CHF, constipation, and hyperglycemia. She reports good appetite PTA. Her appetite is improving, reports she did well with lunch meal, but ate poorly at breakfast (PO: 10%). She attributes her decreased appetite to constipation; pt confirms she has not had a BM since 05/20/12 and failed outpatient therapy for constipation.  She reports UBW of 130-140# over the past several years. She reports her current weight is her highest and attributes this to constipation.  She declined addition of nutritional supplements at this time, due to improving appetite. Educated on importance of good PO intake to promote healing, which she verbalized understanding.   Nutrition Focused Physical Exam:  Subcutaneous Fat:  Orbital Region: WDL Upper Arm Region: WDL Thoracic and Lumbar Region: WDL  Muscle:  Temple Region: WDL Clavicle Bone Region: WDL Clavicle and Acromion Bone Region: WDL Scapular Bone Region: WDL Dorsal Hand: WDL Patellar Region: WDL Anterior Thigh Region: WDL Posterior Calf Region: WDL  Edema: none present   Height: Ht Readings from Last 1 Encounters:  06/02/13 5\' 2"  (1.575 m)    Weight: Wt Readings from Last 1 Encounters:  06/03/13 150 lb 12.7 oz (68.4 kg)    Ideal Body Weight: 110#  % Ideal Body Weight: 136%  Wt Readings from Last 10 Encounters:  06/03/13 150 lb 12.7 oz (68.4 kg)    Usual Body Weight: 135#  % Usual Body  Weight: 111%  BMI:  Body mass index is 27.57 kg/(m^2). Meets criteria for overweight.  Estimated Nutritional Needs: Kcal: 1200-1300 daily Protein: 54-68 grams daily Fluid: 1.2-1.3 L daily  Skin: WDL  Diet Order:  Heart Healthy, Carb modified  EDUCATION NEEDS: -Education needs addressed   Intake/Output Summary (Last 24 hours) at 06/03/13 1453 Last data filed at 06/03/13 1000  Gross per 24 hour  Intake    200 ml  Output    450 ml  Net   -250 ml    Last BM: 05/20/13   Labs:   Recent Labs Lab 06/02/13 1506 06/03/13 0552  NA 135* 136*  K 5.0 4.4  CL 96 97  CO2 27 27  BUN 21 27*  CREATININE 0.64 0.72  CALCIUM 9.5 9.0  GLUCOSE 393* 262*    CBG (last 3)   Recent Labs  06/02/13 2221 06/03/13 0723 06/03/13 1133  GLUCAP 339* 254* 296*    Scheduled Meds: . aspirin EC  81 mg Oral Daily  . enoxaparin (LOVENOX) injection  40 mg Subcutaneous Q24H  . furosemide  40 mg Intravenous Q12H  . lisinopril  10 mg Oral Daily   And  . hydrochlorothiazide  12.5 mg Oral Daily  . insulin aspart  0-5 Units Subcutaneous QHS  . insulin aspart  0-9 Units Subcutaneous TID WC  . insulin glargine  10 Units Subcutaneous QHS  . metoprolol  50 mg Oral BID  . polyethylene glycol  17 g Oral BID  . senna  2 tablet Oral BID  . sodium chloride  3 mL Intravenous Q12H    Continuous  Infusions:   Past Medical History  Diagnosis Date  . Coronary artery disease   . Diabetes mellitus without complication   . Renal disorder     kidney stones    Past Surgical History  Procedure Laterality Date  . Cardiac stents    . Back surgery    . Kidney stone surgery    . Fallopian tube and 1 ovary removed      Wally Shevchenko A. Mayford Knife, RD, LDN Pager: [phone]

## 2013-06-03 NOTE — Care Management Note (Addendum)
    Page 1 of 1   06/04/2013     1:40:40 PM   CARE MANAGEMENT NOTE 06/04/2013  Patient:  Isabel [name], Isabel [name]   Account Number:  [id number]  Date Initiated:  06/03/2013  Documentation initiated by:  Sharrie Rothman  Subjective/Objective Assessment:   Pt admitted from home with CHF. Pt lives with her husband and will return home at discharge. Pt is independent with ADL's.     Action/Plan:   No CM needs noted.   Anticipated DC Date:  06/07/2013   Anticipated DC Plan:  HOME/SELF CARE      DC Planning Services  CM consult      Choice offered to / List presented to:             Status of service:  Completed, signed off Medicare Important Message given?   (If response is "NO", the following Medicare IM given date fields will be blank) Date Medicare IM given:   Date Additional Medicare IM given:    Discharge Disposition:  ACUTE TO ACUTE TRANS  Per UR Regulation:    If discussed at Long Length of Stay Meetings, dates discussed:    Comments:  06/04/13 1340 Arlyss Queen, RN BSN CM Pt to transfer to [employer].  06/03/13 1340 Arlyss Queen, RN BSN CM

## 2013-06-03 NOTE — Progress Notes (Signed)
*  PRELIMINARY RESULTS* Echocardiogram 2D Echocardiogram has been performed.  Isabel [name] 06/03/2013, 3:34 PM

## 2013-06-03 NOTE — Progress Notes (Signed)
PROGRESS NOTE    Isabel [name] YDX:[id number] DOB: 1965/04/23 DOA: 06/02/2013 PCP: [name], MD  HPI/Brief narrative 48 y.o. female with history of CAD, status post stents 2004, type II DM, former smoker, presented to the ED with complaints of constipation and difficulty breathing. She was assessed to have decompensated CHF.  Assessment/Plan:  1. Acute CHF: Unknown type-no echo in system. Admitted to telemetry. Obtain 2-D echo to evaluate LV function. Treat with IV Lasix 40 mg every 12 hourly, strict intake and output and daily weights. Monitor closely. She denies history of hypertension. Clinically improving. ACE inhibitor and metoprolol held secondary to hypotension. 2. Constipation: Milk and molasses enema x1 and will place on bowel regimen including scheduled senna and MiraLAX. Patient had 2 BMs. 3. Uncontrolled type II DM: Hold home oral medications. Check A1c: 12.4 suggesting poor outpatient control. Place on Lantus 10 units each bedtime and NovoLog SSI. Increased Lantus to 15 units each bedtime and change SSI to moderate. She will likely need to be discharged on insulin given very high A1c. 4. Anemia: Likely chronic. Stable 5. Hepatomegaly on abdominal x-ray: Check LFTs and RUQ ultrasound. 6. History of CAD status post stents: Lost to cardiology followup. Asymptomatic of chest pain. No acute changes on EKG and troponin Negative. Continue Aspirin and Beta Blockers as blood pressure permits. 7. History of Anxiety: Continue Home Dose of Xanax When Necessary. 8. Hypotension: Hold antihypertensives and monitor. Up with assistance only.   Code Status:  Full Family Communication:  None at bedside Disposition Plan:  Home in medically stable   Consultants:   None  Procedures:   None  Antibiotics:   None   Subjective:  Dyspnea/orthopnea better. This morning, after ambulating to the bathroom and back, started feeling dizzy, lightheaded and ringing sensation in the ears  without vertigo associated with low blood pressure.  Objective: Filed Vitals:   06/03/13 0517 06/03/13 0822 06/03/13 0838 06/03/13 1140  BP: 86/62 86/58 88/60  94/72  Pulse: 49 67    Temp: 98.4 F (36.9 C)     TempSrc: Oral     Resp: 20     Height:      Weight:      SpO2: 92%       Intake/Output Summary (Last 24 hours) at 06/03/13 1729 Last data filed at 06/03/13 1500  Gross per 24 hour  Intake    200 ml  Output    950 ml  Net   -750 ml   Filed Weights   06/02/13 1247 06/02/13 1700 06/03/13 0500  Weight: 68.04 kg (150 lb) 68 kg (149 lb 14.6 oz) 68.4 kg (150 lb 12.7 oz)     Exam:  General exam:  Pleasant young female lying comfortably in bed Respiratory system:  Reduced breath sounds in the bases with few basal crackles the rest of lung fields clear to auscultation . No increased work of breathing. Cardiovascular system: S1 & S2 heard, RRR. No JVD, murmurs, gallops, clicks or pedal edema. telemetry: Sinus rhythm Gastrointestinal system: Abdomen is nondistended, soft and nontender. Normal bowel sounds heard. Central nervous system: Alert and oriented. No focal neurological deficits. Extremities: Symmetric 5 x 5 power.   Data Reviewed: Basic Metabolic Panel:  Recent Labs Lab 06/02/13 1506 06/03/13 0552  NA 135* 136*  K 5.0 4.4  CL 96 97  CO2 27 27  GLUCOSE 393* 262*  BUN 21 27*  CREATININE 0.64 0.72  CALCIUM 9.5 9.0   Liver Function Tests: No results found for  this basename: AST, ALT, ALKPHOS, BILITOT, PROT, ALBUMIN,  in the last 168 hours No results found for this basename: LIPASE, AMYLASE,  in the last 168 hours No results found for this basename: AMMONIA,  in the last 168 hours CBC:  Recent Labs Lab 06/02/13 1506 06/03/13 0552  WBC 7.0 7.2  NEUTROABS 4.7  --   HGB 11.9* 11.4*  HCT 35.1* 33.7*  MCV 91.2 91.6  PLT 204 213   Cardiac Enzymes:  Recent Labs Lab 06/02/13 1506  TROPONINI <0.30   BNP (last 3 results)  Recent Labs  06/02/13 1506   PROBNP 3307.0*   CBG:  Recent Labs Lab 06/02/13 1808 06/02/13 2221 06/03/13 0723 06/03/13 1133 06/03/13 1631  GLUCAP 276* 339* 254* 296* 161*    No results found for this or any previous visit (from the past 240 hour(s)).      Studies: Dg Abd Acute W/chest  06/02/2013   CLINICAL DATA:  Difficulty breathing and constipation  EXAM: ACUTE ABDOMEN SERIES (ABDOMEN 2 VIEW & CHEST 1 VIEW)  COMPARISON:  Chest radiograph July 04, 2008  FINDINGS: PA chest: There is cardiomegaly with bibasilar edema and bilateral effusions. There is mild pulmonary venous hypertension. There is mild patchy consolidation in the medial bases bilaterally.  Supine and upright abdomen: There is moderate stool in the colon. The bowel gas pattern is unremarkable. No obstruction or free air. Liver appears enlarged. There is a surgical clip in the right pelvis. There are areas of vascular calcification.  IMPRESSION: Congestive heart failure.  Bowel gas pattern unremarkable.  No obstruction or free air.  Hepatomegaly.   Electronically Signed   By: Bretta BangWilliam  Woodruff M.D.   On: 06/02/2013 13:42        Scheduled Meds: . aspirin EC  81 mg Oral Daily  . enoxaparin (LOVENOX) injection  40 mg Subcutaneous Q24H  . furosemide  40 mg Intravenous Q12H  . lisinopril  10 mg Oral Daily   And  . hydrochlorothiazide  12.5 mg Oral Daily  . insulin aspart  0-5 Units Subcutaneous QHS  . insulin aspart  0-9 Units Subcutaneous TID WC  . insulin glargine  10 Units Subcutaneous QHS  . metoprolol  50 mg Oral BID  . polyethylene glycol  17 g Oral BID  . senna  2 tablet Oral BID  . sodium chloride  3 mL Intravenous Q12H   Continuous Infusions:   Principal Problem:   Acute CHF Active Problems:   DM (diabetes mellitus), type 2, uncontrolled   CAD (coronary artery disease), status post stents 2004   Anemia   Constipation    Time spent: 45 minutes.    Marcellus ScottHONGALGI,Chelcea Zahn, MD, FACP, FHM. [facility] Pager [phone]  If  7PM-7AM, please contact night-coverage [email or web address] Password [email or web address] 06/03/2013, 5:29 PM    LOS: 1 day

## 2013-06-03 NOTE — Progress Notes (Signed)
Patients BP low this AM.  86/58 manually.  BP meds held.  Patient reports ringing in her ears upon standing and going to bathroom.  Instructed to now call when having these symptoms and needing to get OOB.  MD paged to notify

## 2013-06-03 NOTE — Progress Notes (Signed)
Inpatient Diabetes Program Recommendations  AACE/ADA: New Consensus Statement on Inpatient Glycemic Control (2013)  Target Ranges:  Prepandial:   less than 140 mg/dL      Peak postprandial:   less than 180 mg/dL (1-2 hours)      Critically ill patients:  140 - 180 mg/dL   Results for ELANORA, RAMSOUR (MRN [id number]) as of 06/03/2013 07:49  Ref. Range 06/02/2013 15:06  Hemoglobin A1C Latest Range: <5.7 % 12.4 (H)   Results for ANNE, KILMON (MRN [id number]) as of 06/03/2013 07:49  Ref. Range 06/02/2013 12:53 06/02/2013 18:08 06/02/2013 22:21  Glucose-Capillary Latest Range: 70-99 mg/dL 248 (H) 250 (H) 037 (H)   Diabetes history: DM2 Outpatient Diabetes medications: Byetta 10 mcg BID, Glipizide 10 mg BID Current orders for Inpatient glycemic control: Lantus 10 units QHS, Novolog 0-9 units AC, Novolog 0-5 units HS  Inpatient Diabetes Program Recommendations Insulin - Basal: Please consider increasing Lantus to 15 units QHS. Correction (SSI): Please consider increasing Novolog correction to moderate scale. Outpatient: Patient will likely need to be discharged on insulin due to uncontrolled diabetes as evidenced by A1C of 12.4%.  Note: MD- Please inform nursing staff if patient will discharge on insulin so patient can be educated on insulin administration prior to discharge.  Thanks, Orlando Penner, RN, MSN, CCRN Diabetes Coordinator Inpatient Diabetes Program [phone] (Team Pager) [phone] ([facility] office) [phone] [facility] office)

## 2013-06-03 NOTE — Progress Notes (Signed)
Educated patient on daily weights and HF management at home.  Patient verbalizes some knowledge of this, but states she normally only weighs about once a week.  Instructed patient that it is important to do this every morning at the same time each day to assess which zone she is .  Pt verbalizes understanding.

## 2013-06-04 ENCOUNTER — Encounter (HOSPITAL_COMMUNITY): Payer: Self-pay | Admitting: General Practice

## 2013-06-04 ENCOUNTER — Inpatient Hospital Stay (HOSPITAL_COMMUNITY): Payer: BC Managed Care – PPO

## 2013-06-04 DIAGNOSIS — I2510 Atherosclerotic heart disease of native coronary artery without angina pectoris: Secondary | ICD-10-CM

## 2013-06-04 LAB — BASIC METABOLIC PANEL
BUN: 23 mg/dL (ref 6–23)
CHLORIDE: 95 meq/L — AB (ref 96–112)
CO2: 29 mEq/L (ref 19–32)
Calcium: 9.6 mg/dL (ref 8.4–10.5)
Creatinine, Ser: 0.61 mg/dL (ref 0.50–1.10)
Glucose, Bld: 277 mg/dL — ABNORMAL HIGH (ref 70–99)
POTASSIUM: 4.2 meq/L (ref 3.7–5.3)
Sodium: 137 mEq/L (ref 137–147)

## 2013-06-04 LAB — GLUCOSE, CAPILLARY
Glucose-Capillary: 263 mg/dL — ABNORMAL HIGH (ref 70–99)
Glucose-Capillary: 264 mg/dL — ABNORMAL HIGH (ref 70–99)
Glucose-Capillary: 237 mg/dL — ABNORMAL HIGH (ref 70–99)
Glucose-Capillary: 262 mg/dL — ABNORMAL HIGH (ref 70–99)

## 2013-06-04 MED ORDER — INSULIN ASPART 100 UNIT/ML ~~LOC~~ SOLN
3.0000 [IU] | Freq: Three times a day (TID) | SUBCUTANEOUS | Status: DC
  Administered 2013-06-04 – 2013-06-06 (×7): 3 [IU] via SUBCUTANEOUS

## 2013-06-04 MED ORDER — INSULIN PEN STARTER KIT
1.0000 | Freq: Once | Status: AC
  Administered 2013-06-04: 1
  Filled 2013-06-04: qty 1

## 2013-06-04 MED ORDER — INSULIN GLARGINE 100 UNIT/ML ~~LOC~~ SOLN
18.0000 [IU] | Freq: Every day | SUBCUTANEOUS | Status: DC
  Administered 2013-06-05: 18 [IU] via SUBCUTANEOUS
  Filled 2013-06-04 (×4): qty 0.18

## 2013-06-04 MED ORDER — LISINOPRIL 2.5 MG PO TABS
2.5000 mg | ORAL_TABLET | Freq: Every day | ORAL | Status: DC
  Administered 2013-06-04 – 2013-06-06 (×2): 2.5 mg via ORAL
  Filled 2013-06-04 (×6): qty 1

## 2013-06-04 NOTE — Progress Notes (Signed)
Gave pt the started insulin syringe starter kit and showed her the beginner educational videos. Pt has no new questions at this time. Will continue to monitor.

## 2013-06-04 NOTE — Plan of Care (Signed)
Problem: Phase I Progression Outcomes Goal: EF % per last Echo/documented,Core Reminder form on chart Outcome: Completed/Met Date Met:  06/04/13 25-30% per 06/03/2013 echo

## 2013-06-04 NOTE — Progress Notes (Addendum)
PROGRESS NOTE    Isabel GaskinsSusan J [name] QIH:[id number] DOB: [birth year] DOA: 06/02/2013 PCP: Selinda FlavinHOWARD, KEVIN, MD  HPI/Brief narrative 48 y.o. female with history of CAD, status post stents 2004, type II DM, former smoker, presented to the ED with complaints of constipation and difficulty breathing. She was assessed to have decompensated CHF.  Assessment/Plan:  1. Acute systolic CHF: She was admitted to telemetry. 2-D echocardiogram was requested. She was started on IV Lasix 40 mg every 12 hourly with significant clinical improvement. Home dose of ACE inhibitor seen beta blockers had to be held secondary to issues with persistent hypotension. 2-D echo shows LVEF 25-30% with septal wall motion abnormalities-worse than last In 2004. Cardiology has evaluated today and [facility] continued diuresis and possible left and right heart cath on 06/07/13. Low dose lisinopril has been started today. TSH and fasting lipids requested. 2. New cardiomyopathy: Rule out ischemic cardiomyopathy. Management as above. 3. History of CAD, prior stents: Asymptomatic of chest pain. [facility] by cardiology. Continue aspirin. Low dose lisinopril resumed. Start beta blockers when blood pressure allows. 4. Constipation: Treated with Milk and molasses enema x1 and placed on bowel regimen including scheduled senna and MiraLAX. Having BMs. 5. Uncontrolled type II DM: Held home oral medications. A1c: 12.4 suggesting poor outpatient control. Titrating up Lantus to 18 units each bedtime, adding NovoLog meal time 3 units 3 times a day and continue moderate sensitivity SSI. She will probably need to discharge on insulins given very high A1c on admission. 6. Anemia: Likely chronic. Stable 7. Hepatomegaly on abdominal x-ray: LFTs unremarkable. No hepatomegaly on abdominal ultrasound. 8. History of Anxiety: Continue Home Dose of Xanax When  Necessary. 9. Hypotension: monitor. Improved/asymptomatic.   Code Status:  Full Family Communication:  None at bedside Disposition Plan:  [facility].  Consultants:   Cardiology  Procedures:   None  Antibiotics:   None   Subjective: States that her dyspnea continues to improve and was finally able to sleep a full night last night. Able to ambulate to bathroom without dizziness, lightheadedness or ringing in her ears.  Objective: Filed Vitals:   06/03/13 1700 06/03/13 2151 06/04/13 0615 06/04/13 1258  BP: 116/73 113/65 108/69 112/72  Pulse: 99 98 90   Temp: 98.3 F (36.8 C) 99.1 F (37.3 C) 98.9 F (37.2 C)   TempSrc:  Oral    Resp: 18 20 20    Height:      Weight:   67.8 kg (149 lb 7.6 oz)   SpO2: 94% 92% 92%     Intake/Output Summary (Last 24 hours) at 06/04/13 1304 Last data filed at 06/03/13 1730  Gross per 24 hour  Intake    240 ml  Output    900 ml  Net   -660 ml   Filed Weights   06/02/13 1700 06/03/13 0500 06/04/13 0615  Weight: 68 kg (149 lb 14.6 oz) 68.4 kg (150 lb 12.7 oz) 67.8 kg (149 lb 7.6 oz)     Exam:  General exam:  Pleasant young female lying comfortably in bed Respiratory system:  Reduced breath sounds in the bases with few fine basal crackles. Rest of lung fields clear to auscultation. No increased work of breathing. Cardiovascular system: S1 & S2 heard, RRR. No JVD, murmurs, gallops, clicks or pedal edema.  Gastrointestinal system: Abdomen is nondistended, soft and nontender. Normal bowel sounds heard. Central nervous system: Alert and oriented. No focal neurological  deficits. Extremities: Symmetric 5 x 5 power.   Data Reviewed: Basic Metabolic Panel:  Recent Labs Lab 06/02/13 1506 06/03/13 0552 06/04/13 0657  NA 135* 136* 137  K 5.0 4.4 4.2  CL 96 97 95*  CO2 27 27 29   GLUCOSE 393* 262* 277*  BUN 21 27* 23  CREATININE 0.64 0.72 0.61  CALCIUM 9.5 9.0 9.6   Liver Function Tests:  Recent Labs Lab  06/03/13 0552  AST 12  ALT 16  ALKPHOS 58  BILITOT 0.3  PROT 6.3  ALBUMIN 3.0*   No results found for this basename: LIPASE, AMYLASE,  in the last 168 hours No results found for this basename: AMMONIA,  in the last 168 hours CBC:  Recent Labs Lab 06/02/13 1506 06/03/13 0552  WBC 7.0 7.2  NEUTROABS 4.7  --   HGB 11.9* 11.4*  HCT 35.1* 33.7*  MCV 91.2 91.6  PLT 204 213   Cardiac Enzymes:  Recent Labs Lab 06/02/13 1506  TROPONINI <0.30   BNP (last 3 results)  Recent Labs  06/02/13 1506  PROBNP 3307.0*   CBG:  Recent Labs Lab 06/03/13 1133 06/03/13 1631 06/03/13 2149 06/04/13 0736 06/04/13 1224  GLUCAP 296* 161* 238* 263* 264*    No results found for this or any previous visit (from the past 240 hour(s)).      Studies: Dg Abd Acute W/chest  06/02/2013   CLINICAL DATA:  Difficulty breathing and constipation  EXAM: ACUTE ABDOMEN SERIES (ABDOMEN 2 VIEW & CHEST 1 VIEW)  COMPARISON:  Chest radiograph July 04, 2008  FINDINGS: PA chest: There is cardiomegaly with bibasilar edema and bilateral effusions. There is mild pulmonary venous hypertension. There is mild patchy consolidation in the medial bases bilaterally.  Supine and upright abdomen: There is moderate stool in the colon. The bowel gas pattern is unremarkable. No obstruction or free air. Liver appears enlarged. There is a surgical clip in the right pelvis. There are areas of vascular calcification.  IMPRESSION: Congestive heart failure.  Bowel gas pattern unremarkable.  No obstruction or free air.  Hepatomegaly.   Electronically Signed   By: Bretta Bang M.D.   On: 06/02/2013 13:42   US Abdomen Limited Ruq  06/04/2013   CLINICAL DATA:  Hepatomegaly, history coronary artery disease, diabetes, CHF  EXAM: US ABDOMEN LIMITED - RIGHT UPPER QUADRANT  COMPARISON:  None  FINDINGS: Gallbladder:  Numerous tiny dependent calculi within gallbladder. No gallbladder wall thickening, pericholecystic fluid or  sonographic Murphy sign.  Common bile duct:  Diameter: Normal caliber 4 mm diameter  Liver:  Normal appearance.  15.8 cm length.  No right upper quadrant ascites.  Right pleural effusion identified.  IMPRESSION: Cholelithiasis without evidence of acute cholecystitis.  Liver is normal in size and appearance.  Right pleural effusion.   Electronically Signed   By: Ulyses Southward M.D.   On: 06/04/2013 08:13        Scheduled Meds: . aspirin EC  81 mg Oral Daily  . enoxaparin (LOVENOX) injection  40 mg Subcutaneous Q24H  . furosemide  40 mg Intravenous Q12H  . insulin aspart  0-15 Units Subcutaneous TID WC  . insulin aspart  0-5 Units Subcutaneous QHS  . insulin aspart  3 Units Subcutaneous TID WC  . insulin glargine  18 Units Subcutaneous QHS  . lisinopril  2.5 mg Oral Daily  . polyethylene glycol  17 g Oral BID  . senna  2 tablet Oral BID  . sodium chloride  3 mL Intravenous Q12H  Continuous Infusions:   Principal Problem:   Acute CHF Active Problems:   DM (diabetes mellitus), type 2, uncontrolled   CAD (coronary artery disease), status post stents 2004   Anemia   Constipation    Time spent: 45 minutes.    Marcellus Scott, MD, FACP, FHM. [facility] Pager [phone]  If 7PM-7AM, please contact night-coverage [email or web address] Password [email or web address] 06/04/2013, 1:04 PM    LOS: 2 days

## 2013-06-04 NOTE — Progress Notes (Addendum)
Inpatient Diabetes Program Recommendations  AACE/ADA: New Consensus Statement on Inpatient Glycemic Control (2013)  Target Ranges:  Prepandial:   less than 140 mg/dL      Peak postprandial:   less than 180 mg/dL (1-2 hours)      Critically ill patients:  140 - 180 mg/dL  Results for CHERL, GORNEY (MRN [id number]) as of 06/04/2013 07:51  Ref. Range 06/03/2013 07:23 06/03/2013 11:33 06/03/2013 16:31 06/03/2013 21:49  Glucose-Capillary Latest Range: 70-99 mg/dL 254 (H) 296 (H) 161 (H) 238 (H)  Results for Isabel [name], Isabel [name] (MRN [id number]) as of 06/04/2013 07:51  Ref. Range 06/04/2013 06:57  Glucose Latest Range: 70-99 mg/dL 277 (H)   Diabetes history: DM2  Outpatient Diabetes medications: Byetta 10 mcg BID, Glipizide 10 mg BID  Current orders for Inpatient glycemic control: Lantus 15 units QHS, Novolog 0-15 units AC, Novolog 0-5 units HS  Inpatient Diabetes Program Recommendations Insulin - Basal: Please increase Lantus to 17 units QHS. Insulin - Meal Coverage: Please consider adding meal coverage; recommend starting with Novolog 3 units TID with meals.  Note: Noted Novolog correction scale was increased to moderate scale yesterday evening. Post prandial glucose consistently elevated and lab fasting glucose this morning was 277 mg/dl.  06/04/13$RemoveBefore'@11'vHXuIUAUOiYvW$ :25-Spoke with patient about diabetes and insulin pen education. Patient reports that she sees Dr. Nadara Mustard (her PCP) for diabetes management.  Patient reports that she takes Glipizide 10 mg BID, and Byetta 10 mcg BID for diabetes control as an outpatient.  Patient states that she had stopped taking the Byetta but recently restarted on it because her diabetes has gotten out of control.  She states that Dr. Nadara Mustard had told her that if the Byetta did not work she would need to go on insulin.  Discussed discharging on insulin and patient is agreeable to start on insulin as an outpatient.  Discussed A1C results (12.4%) and explained what an A1C is, basic  pathophysiology of DM Type 2, basic home care, importance of checking CBGs and maintaining good CBG control to prevent long-term and short-term complications.  Patient reports that she checked her blood sugar sometimes but "not like she should".  Encouraged patient to check her blood sugar 4 times a day (before meals and at bedtime) and to keep a log she can take with her to follow up appointments so the doctor can make adjustments with insulins.  Explained Lantus and Novolog insulin and discussed current regiment.  Explained to the patient that at time of discharge the doctor will let her know which insulins to take and the dosage.  Patient prefers to use insulin pens and she has [employer] which should cover both insulins.  Patient reports that she works at [employer] in [place] and their insurance changed at the beginning of the year from [place] to [place] so she is not familiar with how much her copays will be for insulins.  Provided patient with written information on the Lantus savings program in which she will need to apply on-line for.  If she meets qualifications for savings program her out of pocket co-pay for Lantus would be $25. Patient reports that she will go on-line and apply for the savings program.    Educated patient on insulin pen use at home. Reviewed all steps if insulin pen including attachment of needle, 2-unit air shot, dialing up dose, giving injection, removing needle, disposal of sharps, storage of unused insulin, disposal of insulin etc. Patient able to provide successful return demonstration. MD to give patient Rxs for  insulin pens and insulin pen needles.  Patient verbalized understanding of information discussed and reports that she does not have any further questions related to diabetes at this time.  RNs to provide ongoing basic DM education at bedside with this patient and engage patient to actively check blood glucose and administer insulin injections. Have ordered educational  booklet, insulin starter kit, and DM videos.   Thanks, Barnie Alderman, RN, MSN, CCRN Diabetes Coordinator Inpatient Diabetes Program [phone] (Team Pager) [phone] ([facility] office) [phone] [facility] office)

## 2013-06-04 NOTE — Progress Notes (Signed)
  RD consulted for nutrition education regarding diabetes and a heart healthy diet.  Lab Results  Component Value Date   HGBA1C 12.4* 06/02/2013    RD provided "Carbohydrate Counting for People with Diabetes" and "Low Sodium Nutrition Therapy" handout from the Academy of Nutrition and Dietetics. Pt was not interested in reviewing low sodium handout stating she has heard it all before. Requested information regarding sodium content of foods. RD provided "Sodium Content of Foods List" and encouraged pt to choose the very low sodium and low sodium foods and to avoid the high sodium foods.   Discussed different food groups and their effects on blood sugar, emphasizing carbohydrate-containing foods. Provided list of carbohydrates and recommended serving sizes of common foods. Pt states she was drinking regular sweetened beverages PTA. She knew she shouldn't be drinking them but, states now she knows she really needs to and she plans on drinking water now.   Discussed importance of controlled and consistent carbohydrate intake throughout the day. Provided examples of ways to balance meals/snacks and encouraged intake of high-fiber, whole grain complex carbohydrates. Encouraged pt to avoid high fat/ fried foods. Discussed healthy fats. Encouraged pt to eat all food groups daily. Teach back method used.  Expect good compliance. Pt seems knowledgeable and seems motivated to make diet changes now, for the sake of her health. RD contact information provided. Encouraged pt to contact RD with any questions or concerns.   Body mass index is 26.85 kg/(m^2). Pt meets criteria for Overweight based on current BMI.  Current diet order is Carb Modified, patient is consuming approximately 50-75% of meals at this time. RD assessment 2/19; will continue to monitor.   Ian Malkin RD, LDN Inpatient Clinical Dietitian Pager: [phone] After Hours Pager: [phone]

## 2013-06-04 NOTE — Consult Note (Signed)
Primary cardiologist: Consulting cardiologist:  Clinical Summary Isabel [name] is a 48 y.o.female with a history of CAD with prior stents and DM admitted with constipation and SOB. She describes 3-4 day history of orthopnea and chronic LE edema. She reports over the last few weeks DOE walking room to room at home. Denies any chest pain. She has a prior cardiac history including CAD with prior stents, prior cath 2004 with LM patent, LAD ostail 30% with patent stents x 2, LCX 30%, RCA dominant with diffuse 30-40% disease, LVEF 55% at that time.   Echo this admission showed LVEF 25-30% with multiple WMAs, restrictive diastolic dysfunction. CXR with pulm edema. Trop neg x1, pro-BNP 3300. EKG sinus rhythm, LAE, anteroseptal Q-waves.   No Known Allergies  Medications Scheduled Medications: . aspirin EC  81 mg Oral Daily  . enoxaparin (LOVENOX) injection  40 mg Subcutaneous Q24H  . furosemide  40 mg Intravenous Q12H  . insulin aspart  0-15 Units Subcutaneous TID WC  . insulin aspart  0-5 Units Subcutaneous QHS  . insulin glargine  15 Units Subcutaneous QHS  . polyethylene glycol  17 g Oral BID  . senna  2 tablet Oral BID  . sodium chloride  3 mL Intravenous Q12H     Infusions:     PRN Medications:  acetaminophen, acetaminophen, albuterol, ALPRAZolam, ondansetron (ZOFRAN) IV, ondansetron   Past Medical History  Diagnosis Date  . Coronary artery disease   . Diabetes mellitus without complication   . Renal disorder     kidney stones    Past Surgical History  Procedure Laterality Date  . Cardiac stents    . Back surgery    . Kidney stone surgery    . Fallopian tube and 1 ovary removed      Family History  Problem Relation Age of Onset  . Heart disease Mother   . Cancer Father   . Heart disease Father   . Heart disease Brother     Social History Isabel [name] reports that she has quit smoking. She does not have any smokeless tobacco history on file. Isabel [name] reports  that she drinks alcohol.  Review of Systems CONSTITUTIONAL: No weight loss, fever, chills, weakness or fatigue.  HEENT: Eyes: No visual loss, blurred vision, double vision or yellow sclerae. No hearing loss, sneezing, congestion, runny nose or sore throat.  SKIN: No rash or itching.  CARDIOVASCULAR: per HPI  RESPIRATORY: per HPI  GASTROINTESTINAL: No anorexia, nausea, vomiting or diarrhea. No abdominal pain or blood.  GENITOURINARY: no polyuria, no dysuria NEUROLOGICAL: No headache, dizziness, syncope, paralysis, ataxia, numbness or tingling in the extremities. No change in bowel or bladder control.  MUSCULOSKELETAL: No muscle, back pain, joint pain or stiffness.  HEMATOLOGIC: No anemia, bleeding or bruising.  LYMPHATICS: No enlarged nodes. No history of splenectomy.  PSYCHIATRIC: No history of depression or anxiety.      Physical Examination Blood pressure 108/69, pulse 90, temperature 98.9 F (37.2 C), temperature source Oral, resp. rate 20, height 5\' 2"  (1.575 m), weight 149 lb 7.6 oz (67.8 kg), SpO2 92.00%.  Intake/Output Summary (Last 24 hours) at 06/04/13 1117 Last data filed at 06/03/13 1730  Gross per 24 hour  Intake    480 ml  Output    900 ml  Net   -420 ml    Cardiovascular: RRR, 2/6 systolic murmur at apex, no carotid bruits  Respiratory: crackles at bases  GI: abdomen soft, NT, ND  MSK: extremities are warm, no  edema  Neuro: no focal deficits   Lab Results  Basic Metabolic Panel:  Recent Labs Lab 06/02/13 1506 06/03/13 0552 06/04/13 0657  NA 135* 136* 137  K 5.0 4.4 4.2  CL 96 97 95*  CO2 27 27 29   GLUCOSE 393* 262* 277*  BUN 21 27* 23  CREATININE 0.64 0.72 0.61  CALCIUM 9.5 9.0 9.6    Liver Function Tests:  Recent Labs Lab 06/03/13 0552  AST 12  ALT 16  ALKPHOS 58  BILITOT 0.3  PROT 6.3  ALBUMIN 3.0*    CBC:  Recent Labs Lab 06/02/13 1506 06/03/13 0552  WBC 7.0 7.2  NEUTROABS 4.7  --   HGB 11.9* 11.4*  HCT 35.1* 33.7*    MCV 91.2 91.6  PLT 204 213    Cardiac Enzymes:  Recent Labs Lab 06/02/13 1506  TROPONINI <0.30    BNP: No components found with this basename: POCBNP,    ECG EKG sinus rhythm, LAE, anteroseptal Q-waves.  Imaging CXR FINDINGS:  PA chest: There is cardiomegaly with bibasilar edema and bilateral  effusions. There is mild pulmonary venous hypertension. There is  mild patchy consolidation in the medial bases bilaterally.  Supine and upright abdomen: There is moderate stool in the colon.  The bowel gas pattern is unremarkable. No obstruction or free air.  Liver appears enlarged. There is a surgical clip in the right  pelvis. There are areas of vascular calcification.  IMPRESSION:  Congestive heart failure.  Bowel gas pattern unremarkable. No obstruction or free air.  Hepatomegaly.  Echo 06/03/13 LVEF 25-30%, multiple WMAs, restrictive diastolic dysfunction, mild AS, mild MR, normal IVC, large left pleural effusion  06/2002 Cath FINDINGS:  1. Left main trunk: Medium caliber vessel with mild irregularities.  2. LAD: This is a medium caliber vessel that provides a diagonal Sidi Dzikowski in  the mid section. The LAD has ostial narrowing of 30%. There is then  evidence of 2 stents in the proximal segment encompassing a septal  perforator and extending up to the diagonal Darlinda Bellows. There is mild waist  within the proximal and distal segments of the stent of not greater than  20%. The diagonal branches of the distal LAD have mild irregularities.  The small septal perforator has a pinch at its origin of approximately  60%, but it appears to be filling adequately with TIMI grade 3 flow.  3. Left circumflex artery: This is a small caliber vessel that provides a  marginal Reilly Blades in the mid section. The left circumflex system has mild  narrowing of 30% at the takeoff of the marginal Aleene Swanner.  4. Right coronary: Dominant. This is a medium caliber vessel that provides  a posterior descending  artery and posterior ventricular Arlena Marsan in the  terminal segment. The right coronary artery has diffuse disease of 30%  to 40% in the mid section.  5. LV: Normal end systolic and end diastolic dimensions. Overall left  ventricular function is well preserved. Ejection fraction of greater  than 55%. No mitral regurgitation.  HEMODYNAMICS:  1. LV pressure is 130/0.  2. Aortic is 130/80.  3. LVEDP equals 15.  ASSESSMENT AND PLAN: The patient is a 48 year old female with coronary  artery disease. She has patent left anterior descending artery via previous  stent placement and atypical chest pain. Continued medical therapy [name] be  pursued.   Impression/Recommendations 1. Acute systolic heart failure - hx of CAD, last cath 2004 with patent coronaries and stents, LVEF at that time was 55%. She  has not followed up with cardiology since around that time.  - most recent echo shows significant drop in LVEF, now 25-30% with evidence by echo and EKG of likely prior LAD infarct - on IV lasix 40 mg bid, she is net negative only 670 mL since admission with stable renal function.   - soft blood pressures have limited her medical therapy to this point  - [name] start low dose ACE-I today, hold off on beta blocker as she still appears to be decompensated.  - [facility] further diuresis over the weekend and possible LHC/RHC on Monday given her significant drop in LVEF, class III NYHA symptoms, and wall motion abnormalities. She needs close monitoring in the setting of diuresis and initiation of medical therapy given her soft blood pressures.  - check lipid panel in AM, she [name] require initiation of statin this admission  2. DM - poorly controlled, continued management by internal medicine  3. Constipation - continued management per medicine     Dina RichJonathan Devonte Migues, M.D., F.A.C.C.

## 2013-06-05 DIAGNOSIS — I359 Nonrheumatic aortic valve disorder, unspecified: Secondary | ICD-10-CM

## 2013-06-05 DIAGNOSIS — R7309 Other abnormal glucose: Secondary | ICD-10-CM

## 2013-06-05 DIAGNOSIS — I10 Essential (primary) hypertension: Secondary | ICD-10-CM

## 2013-06-05 DIAGNOSIS — I350 Nonrheumatic aortic (valve) stenosis: Secondary | ICD-10-CM | POA: Diagnosis present

## 2013-06-05 DIAGNOSIS — K5900 Constipation, unspecified: Secondary | ICD-10-CM

## 2013-06-05 DIAGNOSIS — I509 Heart failure, unspecified: Secondary | ICD-10-CM

## 2013-06-05 DIAGNOSIS — E1165 Type 2 diabetes mellitus with hyperglycemia: Secondary | ICD-10-CM

## 2013-06-05 DIAGNOSIS — I5021 Acute systolic (congestive) heart failure: Principal | ICD-10-CM

## 2013-06-05 DIAGNOSIS — IMO0001 Reserved for inherently not codable concepts without codable children: Secondary | ICD-10-CM

## 2013-06-05 LAB — BASIC METABOLIC PANEL
BUN: 25 mg/dL — ABNORMAL HIGH (ref 6–23)
CO2: 30 mEq/L (ref 19–32)
Calcium: 9.7 mg/dL (ref 8.4–10.5)
Chloride: 95 mEq/L — ABNORMAL LOW (ref 96–112)
Creatinine, Ser: 0.59 mg/dL (ref 0.50–1.10)
GFR calc Af Amer: >90 mL/min (ref >90)
GLUCOSE: 220 mg/dL — AB (ref 70–99)
POTASSIUM: 4.3 meq/L (ref 3.7–5.3)
SODIUM: 135 meq/L — AB (ref 137–147)

## 2013-06-05 LAB — LIPID PANEL
Cholesterol: 278 mg/dL — ABNORMAL HIGH (ref 0–200)
HDL: 27 mg/dL — AB (ref >39)
LDL CALC: 192 mg/dL — AB (ref 0–99)
Total CHOL/HDL Ratio: 10.3 RATIO
Triglycerides: 295 mg/dL — ABNORMAL HIGH (ref <150)
VLDL: 59 mg/dL — ABNORMAL HIGH (ref 0–40)

## 2013-06-05 LAB — GLUCOSE, CAPILLARY
GLUCOSE-CAPILLARY: 249 mg/dL — AB (ref 70–99)
GLUCOSE-CAPILLARY: 165 mg/dL — AB (ref 70–99)
Glucose-Capillary: 309 mg/dL — ABNORMAL HIGH (ref 70–99)
Glucose-Capillary: 282 mg/dL — ABNORMAL HIGH (ref 70–99)

## 2013-06-05 LAB — TSH: TSH: 1.14 u[IU]/mL (ref 0.350–4.500)

## 2013-06-05 LAB — PRO B NATRIURETIC PEPTIDE: Pro B Natriuretic peptide (BNP): 1757 pg/mL — ABNORMAL HIGH (ref 0–125)

## 2013-06-05 MED ORDER — ATORVASTATIN CALCIUM 80 MG PO TABS
80.0000 mg | ORAL_TABLET | Freq: Every day | ORAL | Status: DC
  Administered 2013-06-05 – 2013-06-17 (×12): 80 mg via ORAL
  Filled 2013-06-05 (×14): qty 1

## 2013-06-05 MED ORDER — ATORVASTATIN CALCIUM 40 MG PO TABS
40.0000 mg | ORAL_TABLET | Freq: Every day | ORAL | Status: DC
  Filled 2013-06-05: qty 1

## 2013-06-05 MED ORDER — CARVEDILOL 3.125 MG PO TABS
3.1250 mg | ORAL_TABLET | Freq: Two times a day (BID) | ORAL | Status: DC
  Administered 2013-06-05 – 2013-06-06 (×2): 3.125 mg via ORAL
  Filled 2013-06-05 (×4): qty 1

## 2013-06-05 MED ORDER — FUROSEMIDE 20 MG PO TABS
20.0000 mg | ORAL_TABLET | Freq: Two times a day (BID) | ORAL | Status: DC
Start: 2013-06-05 — End: 2013-06-06
  Administered 2013-06-05 – 2013-06-06 (×3): 20 mg via ORAL
  Filled 2013-06-05 (×5): qty 1

## 2013-06-05 MED ORDER — FUROSEMIDE 20 MG PO TABS
20.0000 mg | ORAL_TABLET | Freq: Every day | ORAL | Status: DC
  Filled 2013-06-05: qty 1

## 2013-06-05 MED ORDER — INSULIN GLARGINE 100 UNIT/ML ~~LOC~~ SOLN
25.0000 [IU] | Freq: Every day | SUBCUTANEOUS | Status: DC
  Administered 2013-06-05: 25 [IU] via SUBCUTANEOUS
  Filled 2013-06-05 (×2): qty 0.25

## 2013-06-05 MED ORDER — INSULIN GLARGINE 100 UNIT/ML ~~LOC~~ SOLN
25.0000 [IU] | Freq: Every day | SUBCUTANEOUS | Status: DC
  Filled 2013-06-05: qty 0.25

## 2013-06-05 NOTE — Progress Notes (Addendum)
[facility] PROGRESS NOTE Interim History: y.o. female with history of CAD, status post stents 2004, type II DM, former smoker, presented to the ED with complaints of constipation and difficulty breathing. She was assessed to have decompensated CHF  [facility] Weights   06/04/13 0615 06/04/13 1602 06/05/13 0546  Weight: 67.8 kg (149 lb 7.6 oz) 65.5 kg (144 lb 6.4 oz) 64 kg (141 lb 1.5 oz)        Intake/Output Summary (Last 24 hours) at 06/05/13 0748 Last data filed at 06/05/13 0630  Gross per 24 hour  Intake    482 ml  Output   1500 ml  Net  -1018 ml     Assessment/Plan: Acute systolic CHF (congestive heart failure) NYHAIII/Right lpleural effusion: - IV lasix, ACE-I and beta-blocker. Bp is improving - Fluid restrict diet, daily weights. - Becoming hyponatremic and HCO3 increasing. No JVD and lungs are clean will decrease diuretics - Cariology onboard will need ischemic work up, Echo showed significant drop in EF with WMA. - Echo 2.19.2015: ef 45% with WMA Left atrium: dilated chambers.Pulmonary arteries: Systolic pressure was moderately Increased. - R pleural effusion due to HF.  DM (diabetes mellitus), type 2, uncontrolled - increase Lantuss, cont SSI. - HbgA1c 12.4. History of smoking.  CAD (coronary artery disease), status post stents 2004/MIld Aortic stenosis: - Start statins. - LDL > 100, HDL 27. Niacin as an outpatient. - On ASA, cardiac markers negative.  Normocytic Anemia - stable. >10.  Constipation - mirlaax    Code Status: Full  Family Communication: None at bedside  Disposition Plan: Transfer to [facility].  Consultants:  Cardiology Procedures:  None Antibiotics:  None  HPI/Subjective: Dizziness resolved. SOB improved.   Objective: Filed Vitals:   06/04/13 1605 06/04/13 1931 06/05/13 0227 06/05/13 0546  BP: 93/56 99/61 94/61  102/66  Pulse: 85 83 73 87  Temp: 98 F (36.7 C) 98.1 F (36.7 C) 97.7 F (36.5 C) 98.4 F (36.9 C)   TempSrc: Oral Oral Oral Oral  Resp: 20 20 18 18   Height:      Weight:    64 kg (141 lb 1.5 oz)  SpO2: 93% 92% 94% 94%     Exam:  General: Alert, awake, oriented x3, in no acute distress.  HEENT: No bruits, no goiter. -JVD Heart: Regular rate and rhythm, without murmurs, rubs, gallops.  Lungs: Good air movement, clear. Abdomen: Soft, nontender, nondistended, positive bowel sounds.    Data Reviewed: Basic Metabolic Panel:  Recent Labs Lab 06/02/13 1506 06/03/13 0552 06/04/13 0657 06/05/13 0515  NA 135* 136* 137 135*  K 5.0 4.4 4.2 4.3  CL 96 97 95* 95*  CO2 27 27 29 30   GLUCOSE 393* 262* 277* 220*  BUN 21 27* 23 25*  CREATININE 0.64 0.72 0.61 0.59  CALCIUM 9.5 9.0 9.6 9.7   Liver Function Tests:  Recent Labs Lab 06/03/13 0552  AST 12  ALT 16  ALKPHOS 58  BILITOT 0.3  PROT 6.3  ALBUMIN 3.0*   No results found for this basename: LIPASE, AMYLASE,  in the last 168 hours No results found for this basename: AMMONIA,  in the last 168 hours CBC:  Recent Labs Lab 06/02/13 1506 06/03/13 0552  WBC 7.0 7.2  NEUTROABS 4.7  --   HGB 11.9* 11.4*  HCT 35.1* 33.7*  MCV 91.2 91.6  PLT 204 213   Cardiac Enzymes:  Recent Labs Lab 06/02/13 1506  TROPONINI <0.30   BNP (last 3 results)  Recent  Labs  06/02/13 1506 06/05/13 0515  PROBNP 3307.0* 1757.0*   CBG:  Recent Labs Lab 06/04/13 0736 06/04/13 1224 06/04/13 1726 06/04/13 2203 06/05/13 0644  GLUCAP 263* 264* 237* 262* 249*    No results found for this or any previous visit (from the past 240 hour(s)).   Studies: [place] Abdomen Limited Ruq  06/04/2013   CLINICAL DATA:  Hepatomegaly, history coronary artery disease, diabetes, CHF  EXAM: US ABDOMEN LIMITED - RIGHT UPPER QUADRANT  COMPARISON:  None  FINDINGS: Gallbladder:  Numerous tiny dependent calculi within gallbladder. No gallbladder wall thickening, pericholecystic fluid or sonographic Murphy sign.  Common bile duct:  Diameter: Normal caliber 4 mm  diameter  Liver:  Normal appearance.  15.8 cm length.  No right upper quadrant ascites.  Right pleural effusion identified.  IMPRESSION: Cholelithiasis without evidence of acute cholecystitis.  Liver is normal in size and appearance.  Right pleural effusion.   Electronically Signed   By: Ulyses SouthwardMark  Boles M.D.   On: 06/04/2013 08:13    Scheduled Meds: . aspirin EC  81 mg Oral Daily  . enoxaparin (LOVENOX) injection  40 mg Subcutaneous Q24H  . furosemide  40 mg Intravenous Q12H  . insulin aspart  0-15 Units Subcutaneous TID WC  . insulin aspart  0-5 Units Subcutaneous QHS  . insulin aspart  3 Units Subcutaneous TID WC  . insulin glargine  18 Units Subcutaneous QHS  . lisinopril  2.5 mg Oral Daily  . polyethylene glycol  17 g Oral BID  . senna  2 tablet Oral BID  . sodium chloride  3 mL Intravenous Q12H   Continuous Infusions:    Marinda ElkFELIZ ORTIZ, ABRAHAM  [facility] Pager [phone]  If 8PM-8AM, please contact night-coverage at [email or web address], password [email or web address] 06/05/2013, 7:48 AM  LOS: 3 days

## 2013-06-05 NOTE — Progress Notes (Signed)
    Subjective:  Denies CP or dyspnea   Objective:  Filed Vitals:   06/04/13 1931 06/05/13 0227 06/05/13 0546 06/05/13 0900  BP: 99/61 94/61 102/66 92/60  Pulse: 83 73 87 82  Temp: 98.1 F (36.7 C) 97.7 F (36.5 C) 98.4 F (36.9 C)   TempSrc: Oral Oral Oral   Resp: 20 18 18    Height:      Weight:   141 lb 1.5 oz (64 kg)   SpO2: 92% 94% 94%     Intake/Output from previous day:  Intake/Output Summary (Last 24 hours) at 06/05/13 1118 Last data filed at 06/05/13 0830  Gross per 24 hour  Intake    842 ml  Output   1500 ml  Net   -658 ml    Physical Exam: Physical exam: Well-developed well-nourished in no acute distress.  Skin is warm and dry.  HEENT is normal.  Neck is supple. Chest with diminished BS bases Cardiovascular exam is regular rate and rhythm.  Abdominal exam nontender or distended. No masses palpated. Extremities show no edema. neuro grossly intact    Lab Results: Basic Metabolic Panel:  Recent Labs  78/24/23 0657 06/05/13 0515  NA 137 135*  K 4.2 4.3  CL 95* 95*  CO2 29 30  GLUCOSE 277* 220*  BUN 23 25*  CREATININE 0.61 0.59  CALCIUM 9.6 9.7   CBC:  Recent Labs  06/02/13 1506 06/03/13 0552  WBC 7.0 7.2  NEUTROABS 4.7  --   HGB 11.9* 11.4*  HCT 35.1* 33.7*  MCV 91.2 91.6  PLT 204 213   Cardiac Enzymes:  Recent Labs  06/02/13 1506  TROPONINI <0.30     Assessment/Plan:  1 acute systolic congestive heart failure-symptoms much improved. Still with diminished breath sounds bases of lungs. Continue present dose of Lasix and follow. Follow renal function. 2 cardiomyopathy-with wall motion abnormalities ischemia likely. Plan for right and left cardiac catheterization on Monday. Continue ACE inhibitor. Add Coreg 3.125 mg by mouth twice a day. 3 diabetes mellitus-management per primary care. 4 history of coronary artery disease-continue aspirin and add statin.  Olga Millers 06/05/2013, 11:18 AM

## 2013-06-06 DIAGNOSIS — D649 Anemia, unspecified: Secondary | ICD-10-CM

## 2013-06-06 LAB — GLUCOSE, CAPILLARY
GLUCOSE-CAPILLARY: 304 mg/dL — AB (ref 70–99)
GLUCOSE-CAPILLARY: 266 mg/dL — AB (ref 70–99)
Glucose-Capillary: 238 mg/dL — ABNORMAL HIGH (ref 70–99)
Glucose-Capillary: 168 mg/dL — ABNORMAL HIGH (ref 70–99)

## 2013-06-06 LAB — CBC
HCT: 38.3 % (ref 36.0–46.0)
Hemoglobin: 12.9 g/dL (ref 12.0–15.0)
MCH: 30.9 pg (ref 26.0–34.0)
MCHC: 33.7 g/dL (ref 30.0–36.0)
MCV: 91.8 fL (ref 78.0–100.0)
Platelets: 214 10*3/uL (ref 150–400)
RBC: 4.17 MIL/uL (ref 3.87–5.11)
RDW: 14.2 % (ref 11.5–15.5)
WBC: 6 10*3/uL (ref 4.0–10.5)

## 2013-06-06 LAB — BASIC METABOLIC PANEL
BUN: 24 mg/dL — AB (ref 6–23)
CO2: 30 mEq/L (ref 19–32)
CREATININE: 0.64 mg/dL (ref 0.50–1.10)
Calcium: 10.1 mg/dL (ref 8.4–10.5)
Chloride: 96 mEq/L (ref 96–112)
GFR calc Af Amer: >90 mL/min (ref >90)
Glucose, Bld: 239 mg/dL — ABNORMAL HIGH (ref 70–99)
Potassium: 4 mEq/L (ref 3.7–5.3)
Sodium: 136 mEq/L — ABNORMAL LOW (ref 137–147)

## 2013-06-06 MED ORDER — SODIUM CHLORIDE 0.9 % IJ SOLN: 3.0000 mL | INTRAMUSCULAR | Status: DC | PRN

## 2013-06-06 MED ORDER — SODIUM CHLORIDE 0.9 % IJ SOLN: 3.0000 mL | Freq: Two times a day (BID) | INTRAMUSCULAR | Status: DC

## 2013-06-06 MED ORDER — ASPIRIN 81 MG PO CHEW
81.0000 mg | CHEWABLE_TABLET | ORAL | Status: AC
Start: 2013-06-07 — End: 2013-06-07
  Administered 2013-06-07: 81 mg via ORAL
  Filled 2013-06-06: qty 1

## 2013-06-06 MED ORDER — SODIUM CHLORIDE 0.9 % IV SOLN: 250.0000 mL | INTRAVENOUS | Status: DC | PRN

## 2013-06-06 MED ORDER — CARVEDILOL 6.25 MG PO TABS
6.2500 mg | ORAL_TABLET | Freq: Two times a day (BID) | ORAL | Status: DC
  Administered 2013-06-06 – 2013-06-07 (×2): 6.25 mg via ORAL
  Filled 2013-06-06 (×6): qty 1

## 2013-06-06 MED ORDER — SODIUM CHLORIDE 0.9 % IV SOLN
INTRAVENOUS | Status: DC
  Administered 2013-06-07: 10 mL/h via INTRAVENOUS

## 2013-06-06 MED ORDER — INSULIN GLARGINE 100 UNIT/ML ~~LOC~~ SOLN
35.0000 [IU] | Freq: Every day | SUBCUTANEOUS | Status: DC
  Administered 2013-06-06 – 2013-06-07 (×2): 35 [IU] via SUBCUTANEOUS
  Filled 2013-06-06 (×3): qty 0.35

## 2013-06-06 NOTE — Progress Notes (Signed)
[facility] PROGRESS NOTE Interim History: y.o. female with history of CAD, status post stents 2004, type II DM, former smoker, presented to the ED with complaints of constipation and difficulty breathing. She was assessed to have decompensated CHF  [facility] Weights   06/04/13 1602 06/05/13 0546 06/06/13 0557  Weight: 65.5 kg (144 lb 6.4 oz) 64 kg (141 lb 1.5 oz) 63.322 kg (139 lb 9.6 oz)        Intake/Output Summary (Last 24 hours) at 06/06/13 1049 Last data filed at 06/06/13 9381  Gross per 24 hour  Intake   1000 ml  Output    800 ml  Net    200 ml     Assessment/Plan: Acute systolic CHF (congestive heart failure) NYHAIII/Right lpleural effusion: - oral lasix, ACE-I and beta-blocker. Bp is improving - Fluid restrict diet, daily weights. -  No JVD and lungs are clear. - Cariology onboard will need ischemic work up, Echo showed significant drop in EF with WMA. - Echo 2.19.2015: ef 45% with WMA Left atrium: dilated chambers.Pulmonary arteries: Systolic pressure was moderately Increased. - R pleural effusion due to HF.  DM (diabetes mellitus), type 2, uncontrolled - increase Lantuss, cont SSI. - HbgA1c 12.4. History of smoking.  CAD (coronary artery disease), status post stents 2004/MIld Aortic stenosis: - Start statins. - LDL > 100, HDL 27. Niacin as an outpatient. - On ASA, cardiac markers negative.  Normocytic Anemia - stable. >10.  Constipation - mirlaax    Code Status: Full  Family Communication: None at bedside  Disposition Plan: Transfer to [facility].  Consultants:  Cardiology Procedures:  None Antibiotics:  None  HPI/Subjective: SOB improved. Feels much better  Objective: Filed Vitals:   06/05/13 1756 06/05/13 2100 06/06/13 0557 06/06/13 0859  BP: 106/62 99/62 110/66 107/65  Pulse: 90 86 85 86  Temp:  98.3 F (36.8 C) 97.7 F (36.5 C)   TempSrc:  Oral Oral   Resp:   18   Height:      Weight:   63.322 kg (139 lb 9.6 oz)     SpO2:  96% 97%      Exam:  General: Alert, awake, oriented x3, in no acute distress.  HEENT: No bruits, no goiter. -JVD Heart: Regular rate and rhythm, without murmurs, rubs, gallops.  Lungs: Good air movement, clear. Abdomen: Soft, nontender, nondistended, positive bowel sounds.    Data Reviewed: Basic Metabolic Panel:  Recent Labs Lab 06/02/13 1506 06/03/13 0552 06/04/13 0657 06/05/13 0515 06/06/13 0518  NA 135* 136* 137 135* 136*  K 5.0 4.4 4.2 4.3 4.0  CL 96 97 95* 95* 96  CO2 27 27 29 30 30   GLUCOSE 393* 262* 277* 220* 239*  BUN 21 27* 23 25* 24*  CREATININE 0.64 0.72 0.61 0.59 0.64  CALCIUM 9.5 9.0 9.6 9.7 10.1   Liver Function Tests:  Recent Labs Lab 06/03/13 0552  AST 12  ALT 16  ALKPHOS 58  BILITOT 0.3  PROT 6.3  ALBUMIN 3.0*   No results found for this basename: LIPASE, AMYLASE,  in the last 168 hours No results found for this basename: AMMONIA,  in the last 168 hours CBC:  Recent Labs Lab 06/02/13 1506 06/03/13 0552 06/06/13 0518  WBC 7.0 7.2 6.0  NEUTROABS 4.7  --   --   HGB 11.9* 11.4* 12.9  HCT 35.1* 33.7* 38.3  MCV 91.2 91.6 91.8  PLT 204 213 214   Cardiac Enzymes:  Recent Labs Lab 06/02/13 1506  TROPONINI <0.30   BNP (last 3 results)  Recent Labs  06/02/13 1506 06/05/13 0515  PROBNP 3307.0* 1757.0*   CBG:  Recent Labs Lab 06/05/13 0644 06/05/13 1113 06/05/13 1643 06/05/13 2118 06/06/13 0606  GLUCAP 249* 309* 282* 165* 238*    No results found for this or any previous visit (from the past 240 hour(s)).   Studies: No results found.  Scheduled Meds: . aspirin EC  81 mg Oral Daily  . atorvastatin  80 mg Oral q1800  . carvedilol  3.125 mg Oral BID WC  . enoxaparin (LOVENOX) injection  40 mg Subcutaneous Q24H  . furosemide  20 mg Oral BID  . insulin aspart  0-15 Units Subcutaneous TID WC  . insulin aspart  0-5 Units Subcutaneous QHS  . insulin aspart  3 Units Subcutaneous TID WC  . insulin glargine  35  Units Subcutaneous QHS  . lisinopril  2.5 mg Oral Daily  . polyethylene glycol  17 g Oral BID  . senna  2 tablet Oral BID  . sodium chloride  3 mL Intravenous Q12H   Continuous Infusions:    Isabel [name], Isabel [name]  [facility] Pager [phone]  If 8PM-8AM, please contact night-coverage at [email or web address], password [email or web address] 06/06/2013, 10:49 AM  LOS: 4 days

## 2013-06-06 NOTE — Progress Notes (Signed)
    Subjective:  Denies CP or dyspnea   Objective:  Filed Vitals:   06/05/13 1756 06/05/13 2100 06/06/13 0557 06/06/13 0859  BP: 106/62 99/62 110/66 107/65  Pulse: 90 86 85 86  Temp:  98.3 F (36.8 C) 97.7 F (36.5 C)   TempSrc:  Oral Oral   Resp:   18   Height:      Weight:   139 lb 9.6 oz (63.322 kg)   SpO2:  96% 97%     Intake/Output from previous day:  Intake/Output Summary (Last 24 hours) at 06/06/13 1103 Last data filed at 06/06/13 0929  Gross per 24 hour  Intake   1000 ml  Output    800 ml  Net    200 ml    Physical Exam: Physical exam: Well-developed well-nourished in no acute distress.  Skin is warm and dry.  HEENT is normal.  Neck is supple. Chest with diminished BS bases Cardiovascular exam is regular rate and rhythm.  Abdominal exam nontender or distended. No masses palpated. Extremities show no edema. neuro grossly intact    Lab Results: Basic Metabolic Panel:  Recent Labs  06/05/13 0515 06/06/13 0518  NA 135* 136*  K 4.3 4.0  CL 95* 96  CO2 30 30  GLUCOSE 220* 239*  BUN 25* 24*  CREATININE 0.59 0.64  CALCIUM 9.7 10.1   CBC:  Recent Labs  06/06/13 0518  WBC 6.0  HGB 12.9  HCT 38.3  MCV 91.8  PLT 214     Assessment/Plan:  1 acute systolic congestive heart failure-symptoms much improved. Continue present dose of Lasix and follow. Follow renal function. Hold lasix tonight and in AM prior to cath 2 cardiomyopathy-with wall motion abnormalities ischemia likely. Plan for right and left cardiac catheterization on Monday. Continue ACE inhibitor. Increase Coreg to 6.25 mg by mouth twice a day. 3 diabetes mellitus-management per primary care. 4 history of coronary artery disease-continue aspirin and statin.  Brian Crenshaw 06/06/2013, 11:03 AM    

## 2013-06-07 ENCOUNTER — Inpatient Hospital Stay (HOSPITAL_COMMUNITY): Payer: BC Managed Care – PPO

## 2013-06-07 ENCOUNTER — Other Ambulatory Visit: Payer: Self-pay | Admitting: *Deleted

## 2013-06-07 ENCOUNTER — Encounter (HOSPITAL_COMMUNITY)
Admission: EM | Disposition: A | Discharge status: Home / Self Care | Payer: Self-pay | Source: Home / Self Care | Attending: Cardiothoracic Surgery

## 2013-06-07 DIAGNOSIS — I2510 Atherosclerotic heart disease of native coronary artery without angina pectoris: Secondary | ICD-10-CM

## 2013-06-07 DIAGNOSIS — R079 Chest pain, unspecified: Secondary | ICD-10-CM

## 2013-06-07 DIAGNOSIS — Z01810 Encounter for preprocedural cardiovascular examination: Secondary | ICD-10-CM

## 2013-06-07 HISTORY — PX: LEFT AND RIGHT HEART CATHETERIZATION WITH CORONARY ANGIOGRAM: SHX5449

## 2013-06-07 LAB — POCT I-STAT 3, VENOUS BLOOD GAS (G3P V)
Acid-Base Excess: 2 mmol/L (ref 0.0–2.0)
Bicarbonate: 27.3 meq/L — ABNORMAL HIGH (ref 20.0–24.0)
O2 Saturation: 61 %
TCO2: 29 mmol/L (ref 0–100)
pCO2, Ven: 43.7 mmHg — ABNORMAL LOW (ref 45.0–50.0)
pH, Ven: 7.403 — ABNORMAL HIGH (ref 7.250–7.300)
pO2, Ven: 32 mmHg (ref 30.0–45.0)

## 2013-06-07 LAB — PULMONARY FUNCTION TEST
FEF 25-75 Post: 2.01 L/sec
FEF 25-75 Pre: 1.34 L/sec
FEF2575-%Change-Post: 50 %
FEF2575-%Pred-Post: 75 %
FEF2575-%Pred-Pre: 49 %
FEV1-%Change-Post: 11 %
FEV1-%Pred-Post: 46 %
FEV1-%Pred-Pre: 42 %
FEV1-Post: 1.22 L
FEV1-Pre: 1.1 L
FEV1FVC-%Change-Post: 0 %
FEV1FVC-%Pred-Pre: 106 %
FEV6-%Change-Post: 10 %
FEV6-%Pred-Post: 44 %
FEV6-%Pred-Pre: 39 %
FEV6-Post: 1.41 L
FEV6-Pre: 1.27 L
FEV6FVC-%Pred-Post: 102 %
FEV6FVC-%Pred-Pre: 102 %
FVC-%Change-Post: 10 %
FVC-%Pred-Post: 43 %
FVC-%Pred-Pre: 38 %
FVC-Post: 1.41 L
FVC-Pre: 1.27 L
Post FEV1/FVC ratio: 86 %
Post FEV6/FVC ratio: 100 %
Pre FEV1/FVC ratio: 86 %
Pre FEV6/FVC Ratio: 100 %

## 2013-06-07 LAB — POCT I-STAT 3, ART BLOOD GAS (G3+)
Acid-Base Excess: 2 mmol/L (ref 0.0–2.0)
Bicarbonate: 26.3 meq/L — ABNORMAL HIGH (ref 20.0–24.0)
O2 Saturation: 91 %
TCO2: 28 mmol/L (ref 0–100)
pCO2 arterial: 39.3 mmHg (ref 35.0–45.0)
pH, Arterial: 7.435 (ref 7.350–7.450)
pO2, Arterial: 60 mmHg — ABNORMAL LOW (ref 80.0–100.0)

## 2013-06-07 LAB — ABO/RH: ABO/RH(D): A NEG

## 2013-06-07 LAB — GLUCOSE, CAPILLARY
GLUCOSE-CAPILLARY: 122 mg/dL — AB (ref 70–99)
Glucose-Capillary: 160 mg/dL — ABNORMAL HIGH (ref 70–99)
Glucose-Capillary: 112 mg/dL — ABNORMAL HIGH (ref 70–99)
Glucose-Capillary: 248 mg/dL — ABNORMAL HIGH (ref 70–99)

## 2013-06-07 LAB — SURGICAL PCR SCREEN
MRSA, PCR: NEGATIVE
Staphylococcus aureus: NEGATIVE

## 2013-06-07 LAB — PROTIME-INR
INR: 0.95 (ref 0.00–1.49)
Prothrombin Time: 12.5 seconds (ref 11.6–15.2)

## 2013-06-07 SURGERY — LEFT AND RIGHT HEART CATHETERIZATION WITH CORONARY ANGIOGRAM
Anesthesia: LOCAL

## 2013-06-07 MED ORDER — CEFUROXIME SODIUM 1.5 G IJ SOLR
1.5000 g | INTRAMUSCULAR | Status: AC
  Administered 2013-06-08: .75 g via INTRAVENOUS
  Administered 2013-06-08: 1.5 g via INTRAVENOUS
  Filled 2013-06-07: qty 1.5

## 2013-06-07 MED ORDER — NITROGLYCERIN IN D5W 200-5 MCG/ML-% IV SOLN
2.0000 ug/min | INTRAVENOUS | Status: DC
  Filled 2013-06-07: qty 250

## 2013-06-07 MED ORDER — SODIUM CHLORIDE 0.9 % IV SOLN
INTRAVENOUS | Status: DC
Start: 2013-06-08 — End: 2013-06-08
  Filled 2013-06-07: qty 1

## 2013-06-07 MED ORDER — TEMAZEPAM 15 MG PO CAPS
15.0000 mg | ORAL_CAPSULE | Freq: Once | ORAL | Status: AC | PRN
  Administered 2013-06-07: 15 mg via ORAL
  Filled 2013-06-07: qty 1

## 2013-06-07 MED ORDER — HEPARIN (PORCINE) IN NACL 100-0.45 UNIT/ML-% IJ SOLN
900.0000 [IU]/h | INTRAMUSCULAR | Status: DC
  Administered 2013-06-07: 900 [IU]/h via INTRAVENOUS
  Filled 2013-06-07 (×2): qty 250

## 2013-06-07 MED ORDER — DEXTROSE 5 % IV SOLN
30.0000 ug/min | INTRAVENOUS | Status: DC
  Filled 2013-06-07: qty 2

## 2013-06-07 MED ORDER — FENTANYL CITRATE 0.05 MG/ML IJ SOLN
INTRAMUSCULAR | Status: AC
  Filled 2013-06-07: qty 2

## 2013-06-07 MED ORDER — CHLORHEXIDINE GLUCONATE 4 % EX LIQD
60.0000 mL | Freq: Once | CUTANEOUS | Status: AC
  Administered 2013-06-08: 4 via TOPICAL
  Filled 2013-06-07: qty 60

## 2013-06-07 MED ORDER — BISACODYL 5 MG PO TBEC: 5.0000 mg | DELAYED_RELEASE_TABLET | Freq: Once | ORAL | Status: DC

## 2013-06-07 MED ORDER — METOPROLOL TARTRATE 12.5 MG HALF TABLET
12.5000 mg | ORAL_TABLET | Freq: Once | ORAL | Status: AC
  Administered 2013-06-08: 12.5 mg via ORAL
  Filled 2013-06-07: qty 1

## 2013-06-07 MED ORDER — CHLORHEXIDINE GLUCONATE 4 % EX LIQD
60.0000 mL | Freq: Once | CUTANEOUS | Status: AC
  Administered 2013-06-07: 4 via TOPICAL
  Filled 2013-06-07: qty 60

## 2013-06-07 MED ORDER — MAGNESIUM SULFATE 50 % IJ SOLN
40.0000 meq | INTRAMUSCULAR | Status: DC
  Filled 2013-06-07: qty 10

## 2013-06-07 MED ORDER — DEXMEDETOMIDINE HCL IN NACL 400 MCG/100ML IV SOLN
0.1000 ug/kg/h | INTRAVENOUS | Status: DC
  Filled 2013-06-07: qty 100

## 2013-06-07 MED ORDER — SODIUM CHLORIDE 0.9 % IV SOLN
INTRAVENOUS | Status: DC
  Filled 2013-06-07: qty 30

## 2013-06-07 MED ORDER — VERAPAMIL HCL 2.5 MG/ML IV SOLN
INTRAVENOUS | Status: AC
  Filled 2013-06-07: qty 2

## 2013-06-07 MED ORDER — ALBUTEROL SULFATE (2.5 MG/3ML) 0.083% IN NEBU
2.5000 mg | INHALATION_SOLUTION | Freq: Once | RESPIRATORY_TRACT | Status: AC
  Administered 2013-06-07: 2.5 mg via RESPIRATORY_TRACT

## 2013-06-07 MED ORDER — LIDOCAINE HCL (PF) 1 % IJ SOLN
INTRAMUSCULAR | Status: AC
  Filled 2013-06-07: qty 30

## 2013-06-07 MED ORDER — DEXTROSE 5 % IV SOLN
750.0000 mg | INTRAVENOUS | Status: DC
  Filled 2013-06-07: qty 750

## 2013-06-07 MED ORDER — SODIUM CHLORIDE 0.9 % IV SOLN
INTRAVENOUS | Status: DC
  Filled 2013-06-07: qty 40

## 2013-06-07 MED ORDER — PLASMA-LYTE 148 IV SOLN
INTRAVENOUS | Status: AC
  Administered 2013-06-08: 09:00:00
  Filled 2013-06-07: qty 2.5

## 2013-06-07 MED ORDER — EPINEPHRINE HCL 1 MG/ML IJ SOLN
0.5000 ug/min | INTRAVENOUS | Status: DC
  Filled 2013-06-07: qty 4

## 2013-06-07 MED ORDER — MIDAZOLAM HCL 2 MG/2ML IJ SOLN
INTRAMUSCULAR | Status: AC
  Filled 2013-06-07: qty 2

## 2013-06-07 MED ORDER — NITROGLYCERIN 0.2 MG/ML ON CALL CATH LAB
INTRAVENOUS | Status: AC
  Filled 2013-06-07: qty 1

## 2013-06-07 MED ORDER — POTASSIUM CHLORIDE 2 MEQ/ML IV SOLN
80.0000 meq | INTRAVENOUS | Status: DC
  Filled 2013-06-07: qty 40

## 2013-06-07 MED ORDER — DOPAMINE-DEXTROSE 3.2-5 MG/ML-% IV SOLN
2.0000 ug/kg/min | INTRAVENOUS | Status: DC
  Filled 2013-06-07: qty 250

## 2013-06-07 MED ORDER — HEPARIN (PORCINE) IN NACL 2-0.9 UNIT/ML-% IJ SOLN
INTRAMUSCULAR | Status: AC
  Filled 2013-06-07: qty 1000

## 2013-06-07 MED ORDER — SODIUM CHLORIDE 0.9 % IV SOLN: 1.0000 mL/kg/h | INTRAVENOUS | Status: AC

## 2013-06-07 MED ORDER — VANCOMYCIN HCL 10 G IV SOLR
1250.0000 mg | INTRAVENOUS | Status: AC
  Administered 2013-06-08: 1250 mg via INTRAVENOUS
  Filled 2013-06-07: qty 1250

## 2013-06-07 NOTE — Progress Notes (Signed)
ANTICOAGULATION CONSULT NOTE - Initial Consult  Pharmacy Consult for heparin Indication: s/p cath with 3V CAD  No Known Allergies  Patient Measurements: Height: 5' 1.5" (156.2 cm) Weight: 139 lb 8 oz (63.277 kg) (b scale) IBW/kg (Calculated) : 48.95 Heparin Dosing Weight:  63kg  Vital Signs: Temp: 97.8 F (36.6 C) (02/23 1354) Temp src: Oral (02/23 1354) BP: 90/52 mmHg (02/23 1345) Pulse Rate: 75 (02/23 1354)  Labs:  Recent Labs  06/05/13 0515 06/06/13 0518 06/07/13 0523  HGB  --  12.9  --   HCT  --  38.3  --   PLT  --  214  --   LABPROT  --   --  12.5  INR  --   --  0.95  CREATININE 0.59 0.64  --     Estimated Creatinine Clearance: 74.3 ml/min (by C-G formula based on Cr of 0.64).   Medical History: Past Medical History  Diagnosis Date  . Coronary artery disease   . Diabetes mellitus without complication   . Renal disorder     kidney stones  . CHF (congestive heart failure)     Medications:  Prescriptions prior to admission  Medication Sig Dispense Refill  . ALPRAZolam (XANAX) 1 MG tablet Take 1 mg by mouth 4 (four) times daily as needed.      Marland Kitchen aspirin EC 81 MG tablet Take 81 mg by mouth daily.      Marland Kitchen BYETTA 10 MCG PEN 10 MCG/0.04ML SOPN injection Inject 10 mcg into the skin 2 (two) times daily.      Marland Kitchen GLIPIZIDE XL 10 MG 24 hr tablet Take 10 mg by mouth 2 (two) times daily.      Marland Kitchen lisinopril-hydrochlorothiazide (PRINZIDE,ZESTORETIC) 10-12.5 MG per tablet Take 1 tablet by mouth daily.      . metoprolol (LOPRESSOR) 50 MG tablet Take 50 mg by mouth 2 (two) times daily.       Scheduled:  . aspirin EC  81 mg Oral Daily  . atorvastatin  80 mg Oral q1800  . carvedilol  6.25 mg Oral BID WC  . enoxaparin (LOVENOX) injection  40 mg Subcutaneous Q24H  . insulin aspart  0-15 Units Subcutaneous TID WC  . insulin aspart  0-5 Units Subcutaneous QHS  . insulin aspart  3 Units Subcutaneous TID WC  . insulin glargine  35 Units Subcutaneous QHS  . lisinopril  2.5 mg  Oral Daily  . polyethylene glycol  17 g Oral BID  . senna  2 tablet Oral BID  . sodium chloride  3 mL Intravenous Q12H    Assessment: 48 yo female s/p cath noted with 3V CAD to start heparin 8 hours post sheath removal (removed at about 12pm today).  Goal of Therapy:  Heparin level 0.3-0.7 units/ml Monitor platelets by anticoagulation protocol: Yes   Plan:  -Begin heparin at 900 units/hr (~ 14 units/hg/hr) at 8pm -Heparin level daily wth CBC daily  Harland German, Pharm D 06/07/2013 2:12 PM

## 2013-06-07 NOTE — Progress Notes (Signed)
VASCULAR LAB PRELIMINARY  PRELIMINARY  PRELIMINARY  PRELIMINARY  Pre-op Cardiac Surgery  Carotid Findings:  Bilateral:  1-39% ICA stenosis.  Vertebral artery flow is antegrade.      Upper Extremity Right Left  Brachial Pressures 103 triphasic 111 triphasic  Radial Waveforms triphasic biphasic  Ulnar Waveforms triphasic biphasic  Palmar Arch (Allen's Test) * *   Findings:  *Doppler waveforms obliterate with ulnar and remain normal with radial compressions bilaterally.    Lower  Extremity Right Left  Dorsalis Pedis 85 dampened monophasic 49 dampened monophasic  Anterior Tibial    Posterior Tibial 80 dampened monophasic 66 dampened monophasic  Ankle/Brachial Indices 0.77 0.59    Findings:     Isabel [name], RVT 06/07/2013, 4:59 PM

## 2013-06-07 NOTE — Interval H&P Note (Signed)
History and Physical Interval Note:  06/07/2013 9:39 AM  Isabel [name] is a 48 y.o.female with a history of CAD with prior stents and DM admitted with constipation and SOB. She describes 3-4 day history of orthopnea and chronic LE edema. She reports over the last few weeks DOE walking room to room at home. Denies any chest pain. She has a prior cardiac history including CAD with prior stents, prior cath 2004 with LM patent, LAD ostail 30% with patent stents x 2, LCX 30%, RCA dominant with diffuse 30-40% disease, LVEF 55% at that time.  Echo this admission showed LVEF 25-30% with multiple WMAs, restrictive diastolic dysfunction. CXR with pulm edema. Trop neg x1, pro-BNP 3300. EKG sinus rhythm, LAE, anteroseptal Q-waves.   Isabel [name]  has presented today for surgery, with the diagnosis of New Diagnosis of Cardiomyopathy with Known CAD - concern for Ischemic CM.   The various methods of treatment have been discussed with the patient and family. After consideration of risks, benefits and other options for treatment, the patient has consented to  Procedure(s): LEFT AND RIGHT HEART CATHETERIZATION WITH CORONARY ANGIOGRAM (N/A) +/- PCI as a surgical intervention .    The patient's history has been reviewed, patient examined, no change in status, stable for surgery.  I have reviewed the patient's chart and labs.  Questions were answered to the patient's satisfaction.     Colston Pyle W  Cath Lab Visit (complete for each Cath Lab visit)  Clinical Evaluation Leading to the Procedure:   ACS: no  Non-ACS:    Anginal Classification: CCS III - Exertional Dyspnea  Anti-ischemic medical therapy: Maximal Therapy (2 or more classes of medications)  Non-Invasive Test Results: Intermediate-risk stress test findings: cardiac mortality 1-3%/year  Prior CABG: No previous CABG

## 2013-06-07 NOTE — Progress Notes (Signed)
Notified from attending MD that pt will not be returning to 3E23 from cath lab.  Pt requires a higher level of care at this time.  Nino Glow RN

## 2013-06-07 NOTE — CV Procedure (Signed)
CARDIAC DEPOSITION REPORT  NAME:  Isabel [name]   MRN: [id number] DOB:  May 22, 1965   ADMIT DATE: 06/02/2013 Procedure Date: 06/07/2013  INTERVENTIONAL CARDIOLOGIST: Marykay Lex, M.D., MS PRIMARY CARE PROVIDER: [name], MD PRIMARY CARDIOLOGIST: Antoine Poche, M.D.  PATIENT:  Isabel [name] is a 48 y.o. female with a history of coronary disease status post PCI to the LAD in 2004 as well as type 2 diabetes and history of smoking along with obesity who has had several weeks of worsening exertional dyspnea and constipation. She describes 3-4 day history of orthopnea and chronic LE edema. She reports over the last few weeks DOE walking room to room at home. Denies any chest pain. She has a prior cardiac history including CAD with prior stents, prior cath 2004 with LM patent, LAD ostail 30% with patent stents x 2, LCX 30%, RCA dominant with diffuse 30-40% disease, LVEF 55% at that time.  Echo this admission showed LVEF 25-30% with multiple WMAs, restrictive diastolic dysfunction. CXR with pulm edema. Trop neg x1, pro-BNP 3300. EKG sinus rhythm, LAE, anteroseptal Q-waves.  Based on the wall motion abnormalities and reduced ejection fraction, she is referred for right and left heart catheterization.  PRE-OPERATIVE DIAGNOSIS:    New diagnosis of Cardiomyopathy, presumed Ischemic  Known CAD status post PCI to LAD   PROCEDURES PERFORMED:    Left and Right Heart Catheterization with Coronary Angiography  PROCEDURE:Consent:  Risks of procedure as well as the alternatives and risks of each were explained to the (patient/caregiver).  Consent for procedure obtained. Consent for signed by MD and patient with RN witness -- placed on chart.   PROCEDURE: The patient was brought to the 2nd Floor [facility] in the fasting state and prepped and draped in the usual sterile fashion for Right groin or radial access. A modified Allen's test with plethysmography was  performed, revealing excellent Ulnar artery collateral flow.  Sterile technique was used including antiseptics, cap, gloves, gown, hand hygiene, mask and sheet.  Skin prep: Chlorhexidine.  Time Out: Verified patient identification, verified procedure, site/side was marked, verified correct patient position, special equipment/implants available, medications/allergies/relevent history reviewed, required imaging and test results available.  Performed  Access: After attempts are made at Right Brachial Vein and Right Radial Artery were successful, the plan was then to convert to femoral approach.  Venous: Right Common Femoral: 7 Fr sheath; modified Seldinger technique  Arterial: Right Common Femoral Artery; 5 Fr Sheath -- fluoroscopically guided modified Seldinger technique   Right Heart Catheterization:  7 Fr Swan Ganz Catheter was advanced through the sheath, and with the balloon inflated once in the SVC, was advanced under fluoroscopic guidance into the first the Right Atrium, then through the Right Ventricle into the Main Pulmonary Artery and into the Wedge position.  Hemodynamics measurements were obtained in each location.  Simultaneous Oxygen saturation were recorded in both the Pulmonary Artery and the Central Aorta.    Simultaneous pressure measurements were obtained in the PA/PCWP and RV along with LV.    Thermodilution injections were  performed to calculate Cardiac Output.  The catheter was then removed with the out of body over wire the sheath was flushed with heparinized saline..  Diagnostic Left Heart Catheterization And Coronary Angiography:   5 Fr Angled Pigtail, JL 4, JR 4 cath was advanced exchange over J-wire.  LV Hemodynamics (LV Gram): Angled pigtail  Left Coronary Artery Angiography: JL4  Right Coronary Artery Angiography: There are 4  Sheaths:  To be removed in brachial area with manual pressure held for hemostasis.  Hemodynamic Findings:   SaO2% Pressure mmHg  Mean P mmHg EDP mmHg      Right Atrium  10/8  7      Right Ventricle  30/4  8       Pulmonary Artery 61 34/13 23       PCWP  20/17  16       Central Aortic 91 100/61 78       Left Ventricle  100/9  19         Cardiac Output:  Cardiac Index:       Fick 4.14  2.52       Thermodilution 3.09  1.88    Left Ventriculography:  EF: 20th and 25 %  Wall Motion: Severe global hypokinesis with worse in the mid to distal anterior wall as well as distal inferior/apical wall  Coronary Anatomy:  Left Main: Large caliber vessel that bifurcates into the LAD, and circumflex. Mild calcified There is distal Left Main involvement and the ostial LAD lesion with a roughly 40-50% stenosis. LAD: Based on the  near ostialstent size,  originally a large caliber vessel with ostial and proximal 90-95% stenosis in the entire stent. At the distal end the stent there is again subtotal occlusion with TIMI 1 flow distally in the LAD appears to be a small-caliber vessel distally.    no diagonal vessels were visible  Left Circumflex:  Moderate caliber vessel that gives rise to a very proximal OM branch. There is ostial involvement with roughly 50-60% stenosis involving the ostium of the Left Circumflex. The circumflex then bifurcates into OM2and the review of circumflex terminates as a small posterolateral branch. Diffuse mild irregularities throughout the  OM1:  Small to moderate caliber vessel with ostial 90% stenosis  OM 2:  Moderate caliber vessel and large effusion. Diffusely mild disease.   RCA:  Moderate caliber, dominant vessel it is diffusely diseased. There is a early mid 60-70% stenosis followed by a 78% stenosis just before the crux.  The vessel bifurcates distally into the Right Posterior Descending Artery  (RPDA) and the Right Posterior AV Groove Branch (RPAV).  RPDA:  Moderate caliber vessel with diffuse luminal or delirious.  RPL Sysytem:The RPAV  begins a moderate caliber vessel gives rise to 2 major  posterolateral branches. The distal RPL 2 has competitive flow  After reviewing the initial angiography,  the patient has multivessel disease and better suited for bypass surgery.   MEDICATIONS:  Anesthesia:  Local Lidocaine 18 ml  Sedation:  1 mg IV Versed, 50 mcg IV fentanyl ;   Omnipaque Contrast:  70 ml  PATIENT DISPOSITION:    The patient was transferred to the PACU holding area in a hemodynamicaly stable, chest pain free condition.  The patient tolerated the procedure well, and there were no complications.  EBL:   < 20 ml  The patient was stable before, during, and after the procedure.  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 There is additional moderate to severe disease in the RCA  Severely reduced LV function with LAD and RCA distribution wall motion abnormalities.  Severely reduced Cardiac Output & Index.  Relatively adequate diuresis based on Right Heart Cath Pressures and LVEDP   PLAN OF CARE:  Transfer to CCU for post cardiac catheterization care.  CT surgery has been counsulted  Will restart IV heparin  She will need aggressive medical care including diabetic management.  Marykay LexHARDING,Licia Harl W, M.D., M.S. [facility] [address] [place], [place]  [id number]  [phone]  06/07/2013 9:57 AM

## 2013-06-07 NOTE — Progress Notes (Signed)
BP 99/58  Pulse 73  Temp(Src) 97.9 F (36.6 C) (Oral)  Resp 18  Ht 5' 1.5" (1.562 m)  Wt 63.277 kg (139 lb 8 oz)  BMI 25.93 kg/m2  SpO2 94% - cardiac cath showed triple vessel disease. - cardiology will take over care.

## 2013-06-07 NOTE — Progress Notes (Addendum)
[address]       [place] [address]             [phone]        MANAHIL VANZILE [facility] Medical Record #[id number] Date of Birth: 22-Dec-1965  INTERVENTIONAL CARDIOLOGIST: Marykay Lex, M.D., MS  PRIMARY CARE PROVIDER: [name], MD  PRIMARY CARDIOLOGIST: Antoine Poche, M.D.      Chief Complaint:    Chief Complaint  Patient presents with  . Shortness of Breath and orthopnea   . Constipation    History of Present Illness:     This is a 48 year old Caucasian female with history of CAD, (status post stents 2004) type II DM, former smoker, who presented to [facility] ED with complaints of constipation and difficulty breathing. She stated that she has had an approximately 2 weeks history of generally feeling unwell, abdominal bloating, and constipation. She tried oral Dulcolax without success. Several days prior to admission, she had couple episodes of nonbloody emesis. She tried a rectal suppository 3 days, prior to admission, without success. She then saw her primary care physician and was started on MiraLAX without changes. She has been passing flatus. She has hd no further emesis and denies abdominal pain. She also noticed progressively worsening orthopnea for the last several days. She used to use one pillow and now uses 4 pillows to sleep, as she is unable to lie flat. She had a minimal dry cough. She also felt hot and cold but denied any fevers or chills. In the ED, her sodium was 135, glucose was 393, hemoglobin 11.9, troponin less than 0.30, and proBNP 3307. Chest x-ray suggestive of congestive heart failure. Abdominal x-ray shows unremarkable bowel gas pattern and hepatomegaly. EKG showed sinus rhythm, LAE, anteroseptal Q waves. She was admitted by the hospitalist.  A 2 D echo was done. Results showed a LVEF of 25-30%,  hypokinesis of the mid anterior, basal-mid anteroseptal, mid inferoseptal, mid anterolateral, apical septal, apical lateral,  and apical myocardium.Mildly calcified aortic annulus, trileaflet, mildly thickened leaflets, and there was mild aortic stenosis.Mildly calcified mitral valve annulus, mildly thickened leaflets, and mild mitral regurgitation. Also, there was a mild to moderate pericardial effusion adjacent to the RV free wall and large left pleural effusion. Cardiology was then consulted. She was given additional lasix for acute CHF, started on an ACE and BB. Cardiac catheterization was done by Dr. Herbie Baltimore today. She was found to have a LVEF of 20-25%, ostial and proximal 90-95% stenosis in the entire stent. At the distal end the stent, there is again subtotal occlusion.  OM1 is a small to moderate caliber vessel with ostial 90% stenosis. The RCA is a moderate caliber, dominant vessel that it is diffusely diseased. There is a early mid 60-70% stenosis followed by a 78% stenosis just before the crux. A cardiothoracic consultation was obtained with Dr. Tyrone Sage for the consideration of coronary artery bypass grafting surgery. Currently, the patient is chest pain FREE and has no shortness of breath. Constipation has been resolved.   Current Activity/ Functional Status: Patient is independent with mobility/ambulation, transfers, ADL's, IADL's.   Zubrod Score: At the time of surgery this patient's most appropriate activity status/level should be described as: []     0    Normal activity, no symptoms [x]     1    Restricted in physical strenuous activity but ambulatory, able to do out light work []     2  Ambulatory and capable of self care, unable to do work activities, up and about                 more than 50%  Of the time                            []     3    Only limited self care, in bed greater than 50% of waking hours []     4    Completely disabled, no self care, confined to bed or chair []     5    Moribund  Past Medical History  Diagnosis Date  . Coronary artery disease   . Diabetes mellitus without complication    . Renal disorder     kidney stones  . CHF (congestive heart failure)     Past Surgical History  Procedure Laterality Date  . Cardiac stents    . Back surgery for a ruptured disc 00'    . Kidney stone surgery 00'    . Fallopian tube and 1 ovary removed after tumor on uterus was removed    . Tonsillectomy       Social History  . Marital Status: Married    Spouse Name: N/A    Number of Children: N/A  . Years of Education: N/A   Occupational History  . Control room operator at [employer]   Social History Main Topics  . Smoking status: Former Games developer. Quit 8-9 years ago  . Smokeless tobacco: Never Used     Comment: QUIT SMOKING YEARS AGO "  . Alcohol Use: Yes     Comment: occ  . Drug Use: No  . Sexual Activity: Not on file   Allergies:No known Allergies  Current Facility-Administered Medications  Medication Dose Route Frequency Provider Last Rate Last Dose  . acetaminophen (TYLENOL) tablet 650 mg  650 mg Oral Q6H PRN Elease Etienne, MD   650 mg at 06/04/13 1610   Or  . acetaminophen (TYLENOL) suppository 650 mg  650 mg Rectal Q6H PRN Elease Etienne, MD      . albuterol (PROVENTIL) (2.5 MG/3ML) 0.083% nebulizer solution 2.5 mg  2.5 mg Nebulization Q2H PRN Elease Etienne, MD      . ALPRAZolam Prudy Feeler) tablet 1 mg  1 mg Oral QID PRN Elease Etienne, MD   1 mg at 06/03/13 2156  . [START ON 06/08/2013] aminocaproic acid (AMICAR) 10 g in sodium chloride 0.9 % 100 mL infusion   Intravenous To OR Delight Ovens, MD      . aspirin EC tablet 81 mg  81 mg Oral Daily Elease Etienne, MD   81 mg at 06/06/13 1127  . atorvastatin (LIPITOR) tablet 80 mg  80 mg Oral q1800 Lewayne Bunting, MD   80 mg at 06/07/13 1707  . carvedilol (COREG) tablet 6.25 mg  6.25 mg Oral BID WC Lewayne Bunting, MD   6.25 mg at 06/07/13 0636  . [START ON 06/08/2013] cefUROXime (ZINACEF) 1.5 g in dextrose 5 % 50 mL IVPB  1.5 g Intravenous To OR Delight Ovens, MD      . Melene Muller ON 06/08/2013]  cefUROXime (ZINACEF) 750 mg in dextrose 5 % 50 mL IVPB  750 mg Intravenous To OR Delight Ovens, MD      . Melene Muller ON 06/08/2013] dexmedetomidine (PRECEDEX) 400 MCG/100ML infusion  0.1-0.7 mcg/kg/hr Intravenous To OR Delight Ovens, MD      . [  START ON 06/08/2013] DOPamine (INTROPIN) 800 mg in dextrose 5 % 250 mL infusion  2-20 mcg/kg/min Intravenous To OR Delight Ovens, MD      . Melene Muller ON 06/08/2013] EPINEPHrine (ADRENALIN) 4,000 mcg in dextrose 5 % 250 mL infusion  0.5-20 mcg/min Intravenous To OR Delight Ovens, MD      . Melene Muller ON 06/08/2013] heparin 2,500 Units, papaverine 30 mg in electrolyte-148 (PLASMALYTE-148) 500 mL irrigation   Irrigation To OR Delight Ovens, MD      . Melene Muller ON 06/08/2013] heparin 30,000 units/NS 1000 mL solution for CELLSAVER   Other To OR Delight Ovens, MD      . heparin ADULT infusion 100 units/mL (25000 units/250 mL)  900 Units/hr Intravenous Continuous Benny Lennert, [facility]      . insulin aspart (novoLOG) injection 0-15 Units  0-15 Units Subcutaneous TID WC Elease Etienne, MD   3 Units at 06/07/13 [phone]  . insulin aspart (novoLOG) injection 0-5 Units  0-5 Units Subcutaneous QHS Elease Etienne, MD   3 Units at 06/06/13 2137  . insulin aspart (novoLOG) injection 3 Units  3 Units Subcutaneous TID WC Elease Etienne, MD   3 Units at 06/06/13 1825  . insulin glargine (LANTUS) injection 35 Units  35 Units Subcutaneous QHS Marinda Elk, MD   35 Units at 06/06/13 2200  . [START ON 06/08/2013] insulin regular (NOVOLIN R,HUMULIN R) 1 Units/mL in sodium chloride 0.9 % 100 mL infusion   Intravenous To OR Delight Ovens, MD      . lisinopril (PRINIVIL,ZESTRIL) tablet 2.5 mg  2.5 mg Oral Daily Penny Pia, MD   2.5 mg at 06/06/13 1128  . [START ON 06/08/2013] magnesium sulfate (IV Push/IM) injection 40 mEq  40 mEq Other To OR Delight Ovens, MD      . Melene Muller ON 06/08/2013] nitroGLYCERIN 0.2 mg/mL in dextrose 5 % infusion  2-200 mcg/min Intravenous To  OR Delight Ovens, MD      . ondansetron [facility]) tablet 4 mg  4 mg Oral Q6H PRN Elease Etienne, MD       Or  . ondansetron (ZOFRAN) injection 4 mg  4 mg Intravenous Q6H PRN Elease Etienne, MD      . Melene Muller ON 06/08/2013] phenylephrine (NEO-SYNEPHRINE) 20 mg in dextrose 5 % 250 mL infusion  30-200 mcg/min Intravenous To OR Delight Ovens, MD      . polyethylene glycol (MIRALAX / GLYCOLAX) packet 17 g  17 g Oral BID Elease Etienne, MD   17 g at 06/05/13 1031  . [START ON 06/08/2013] potassium chloride injection 80 mEq  80 mEq Other To OR Delight Ovens, MD      . senna [facility]) tablet 17.2 mg  2 tablet Oral BID Elease Etienne, MD   17.2 mg at 06/06/13 1128  . sodium chloride 0.9 % injection 3 mL  3 mL Intravenous Q12H Elease Etienne, MD   3 mL at 06/06/13 2140  . [START ON 06/08/2013] vancomycin (VANCOCIN) 1,250 mg in sodium chloride 0.9 % 250 mL IVPB  1,250 mg Intravenous To OR Delight Ovens, MD        Prescriptions prior to admission  Medication Sig Dispense Refill  . ALPRAZolam (XANAX) 1 MG tablet Take 1 mg by mouth 4 (four) times daily as needed.      Marland Kitchen aspirin EC 81 MG tablet Take 81 mg by mouth daily.      Marland Kitchen  BYETTA 10 MCG PEN 10 MCG/0.04ML SOPN injection Inject 10 mcg into the skin 2 (two) times daily.      Marland Kitchen GLIPIZIDE XL 10 MG 24 hr tablet Take 10 mg by mouth 2 (two) times daily.      Marland Kitchen lisinopril-hydrochlorothiazide (PRINZIDE,ZESTORETIC) 10-12.5 MG per tablet Take 1 tablet by mouth daily.      . metoprolol (LOPRESSOR) 50 MG tablet Take 50 mg by mouth 2 (two) times daily.        Family History  Problem Relation Age of Onset  . Heart disease Mother Alive recent stroke  . Cancer Father Died in 31  . Heart disease Father Mi before died of malignancy  . Heart disease Brother     Review of Systems:     Cardiac Review of Systems: Y or N  Chest Pain [  N  ]  Exertional SOB  [ Y ]  Orthopnea [ Y ]  Pedal Edema [ Y  ]    Palpitations Klaus.Mock  ] Syncope  [ N ]    Presyncope [  N ]  General Review of Systems: [Y] = yes [  ]=no Constitional: recent weight change [Y-gain  ]; anorexia [  N]; fatigue [ Y ]; nausea [ N ]; night sweats [ N ]; fever [ N ]; or chills [ N ]                                                          Eye : blurred vision [ N ]; diplopia [ N  ]; vision changes [ N ];  Amaurosis fugax[N  ]; Resp: cough [ N ];  wheezing[ N ];  hemoptysis[ N ]; shortness of breath[Y  ]  GI:  gallstones[ N ], vomiting[ N ];  dysphagia[ N ]; melena[ N ];  hematochezia [  N]; heartburn[ N ];    GU: kidney stones [ Y ]; hematuria[ N ];   dysuria Klaus.Mock  ];  nocturia[ N ];  history of  obstruction [ N ]; urinary frequency [N]             Skin: rash, swelling[ N ];, hair loss[ N ];  peripheral edema[ Y ];  or itching[ N ]; Musculosketetal: myalgias[ N ];  joint swelling[ N ];  joint erythema[ N ];  joint pain[ N ];  back pain[ Y ];  Heme/Lymph: bruising[ N ];  bleeding[N  ];  anemia[N  ];  Neuro: TIA[  N];  headaches[ N ];  stroke[ N ];  vertigo[ N ];  seizures[ N ];   paresthesias[N  ];  difficulty walking[N  ];  Psych:depression[ N ]; anxiety[  N];  Endocrine: diabetes[Y  ];  thyroid dysfunction[N  ];    Physical Exam: BP 96/52  Pulse 85  Temp(Src) 98.4 F (36.9 C) (Oral)  Resp 13  Ht 5' 1.5" (1.562 m)  Wt 139 lb 8 oz (63.277 kg)  BMI 25.93 kg/m2  SpO2 95%  General appearance: alert, cooperative and no distress HEENT: Head-atraumatic, normocephalic Eyes-EOMI, PERRLA, scelra are non icteric Neck-supple, no JVD, no carotid bruit Heart: regular rate and rhythm, S1, S2 normal, no murmur, click, rub or gallop Lungs: Diminished at left base;clear on right. No rales, wheezes, or rhonchi. Abdomen: soft, non-tender; bowel sounds normal; no masses,  no organomegaly Extremities: No cyanosis, clubbing, or edema. Pulses intact bilaterally Neurologic: intact without focal deficits  Diagnostic Studies & Laboratory data:     Recent Radiology Findings:  Dg Abd  Acute W/chest  06/02/2013   CLINICAL DATA:  Difficulty breathing and constipation  EXAM: ACUTE ABDOMEN SERIES (ABDOMEN 2 VIEW & CHEST 1 VIEW)  COMPARISON:  Chest radiograph July 04, 2008  FINDINGS: PA chest: There is cardiomegaly with bibasilar edema and bilateral effusions. There is mild pulmonary venous hypertension. There is mild patchy consolidation in the medial bases bilaterally.  Supine and upright abdomen: There is moderate stool in the colon. The bowel gas pattern is unremarkable. No obstruction or free air. Liver appears enlarged. There is a surgical clip in the right pelvis. There are areas of vascular calcification.  IMPRESSION: Congestive heart failure.  Bowel gas pattern unremarkable.  No obstruction or free air.  Hepatomegaly.   Electronically Signed   By: Bretta Bang M.D.   On: 06/02/2013 13:42   US Abdomen Limited Ruq  06/04/2013   CLINICAL DATA:  Hepatomegaly, history coronary artery disease, diabetes, CHF  EXAM: US ABDOMEN LIMITED - RIGHT UPPER QUADRANT  COMPARISON:  None  FINDINGS: Gallbladder:  Numerous tiny dependent calculi within gallbladder. No gallbladder wall thickening, pericholecystic fluid or sonographic Murphy sign.  Common bile duct:  Diameter: Normal caliber 4 mm diameter  Liver:  Normal appearance.  15.8 cm length.  No right upper quadrant ascites.  Right pleural effusion identified.  IMPRESSION: Cholelithiasis without evidence of acute cholecystitis.  Liver is normal in size and appearance.  Right pleural effusion.   Electronically Signed   By: Ulyses Southward M.D.   On: 06/04/2013 08:13    Recent Lab Findings: Lab Results  Component Value Date   WBC 6.0 06/06/2013   HGB 12.9 06/06/2013   HCT 38.3 06/06/2013   PLT 214 06/06/2013   GLUCOSE 239* 06/06/2013   CHOL 278* 06/05/2013   TRIG 295* 06/05/2013   HDL 27* 06/05/2013   LDLCALC 192* 06/05/2013   ALT 16 06/03/2013   AST 12 06/03/2013   NA 136* 06/06/2013   K 4.0 06/06/2013   CL 96 06/06/2013   CREATININE 0.64 06/06/2013    BUN 24* 06/06/2013   CO2 30 06/06/2013   TSH 1.140 06/04/2013   INR 0.95 06/07/2013   HGBA1C 12.4* 06/02/2013   CATH:Hemodynamic Findings:   SaO2%  Pressure mmHg  Mean P mmHg  EDP mmHg   Right Atrium   10/8   7   Right Ventricle   30/4   8   Pulmonary Artery  61  34/13  23    PCWP   20/17  16    Central Aortic  91  100/61  78    Left Ventricle   100/9   19          Cardiac Output:   Cardiac Index:    Fick  4.14   2.52    Thermodilution  3.09   1.88    Left Ventriculography:  EF: 20th and 25 %  Wall Motion: Severe global hypokinesis with worse in the mid to distal anterior wall as well as distal inferior/apical wall Coronary Anatomy:  Left Main: Large caliber vessel that bifurcates into the LAD, and circumflex. Mild calcified There is distal Left Main involvement and the ostial LAD lesion with a roughly 40-50% stenosis. LAD: Based on the near ostialstent size, originally a large caliber vessel with ostial and proximal 90-95% stenosis in the entire  stent. At the distal end the stent there is again subtotal occlusion with TIMI 1 flow distally in the LAD appears to be a small-caliber vessel distally.  no diagonal vessels were visible Left Circumflex: Moderate caliber vessel that gives rise to a very proximal OM branch. There is ostial involvement with roughly 50-60% stenosis involving the ostium of the Left Circumflex. The circumflex then bifurcates into OM2and the review of circumflex terminates as a small posterolateral branch. Diffuse mild irregularities throughout the  OM1: Small to moderate caliber vessel with ostial 90% stenosis  OM 2: Moderate caliber vessel and large effusion. Diffusely mild disease. RCA: Moderate caliber, dominant vessel it is diffusely diseased. There is a early mid 60-70% stenosis followed by a 78% stenosis just before the crux. The vessel bifurcates distally into the Right Posterior Descending Artery (RPDA) and the Right Posterior AV Groove Branch (RPAV).  RPDA:  Moderate caliber vessel with diffuse luminal or delirious.  RPL Sysytem:The RPAV begins a moderate caliber vessel gives rise to 2 major posterolateral branches. The distal RPL 2 has competitive flow After reviewing the initial angiography, the patient has multivessel disease and better suited for bypass surgery.  MEDICATIONS:  Anesthesia: Local Lidocaine 18 ml Sedation: 1 mg IV Versed, 50 mcg IV fentanyl ;  Omnipaque Contrast: 70 ml PATIENT DISPOSITION:  The patient was transferred to the PACU holding area in a hemodynamicaly stable, chest pain free condition.  The patient tolerated the procedure well, and there were no complications. EBL: < 20 ml  The patient was stable before, during, and after the procedure. POST-OPERATIVE DIAGNOSIS:  Severe multivessel disease with subtotal occlusion of the LAD stent that has ostial involvement including the distal left main and ostial left circumflex. There is additional moderate to severe disease in the RCA  Severely reduced LV function with LAD and RCA distribution wall motion abnormalities.  Severely reduced Cardiac Output & Index.  Relatively adequate diuresis based on Right Heart Cath Pressures and LVEDP  PLAN OF CARE:  Transfer to CCU for post cardiac catheterization care. CT surgery has been counsulted  Will restart IV heparin  She will need aggressive medical care including diabetic management. Marykay LexHARDING,DAVID W, M.D., M.S.   Assessment / Plan:      1.Multi vessel CAD- with lv dysfunction will require CABG. 2.Cardiomyopathy likely ischemic 20-25 % EF by echo and cath 3. Acute systolic heart failure-Last BNP down to 1757 after diuresis. 4.DM-pre op HGA1C 12.4. Will require close follow up as an outpatient. 5.Hyperlipidemia-on Lipitor 80 at bedtime- poor control 6 mild cerebral vascular disease 7 diabetic neuropathy feet 8 poorly controlled DM Hba1c 12  I have seen and reviewed the dx with the patient, I have recommended proceeding  with  CABG, options. The goals risks and alternatives of the planned surgical procedure CABG  have been discussed with the patient in detail. The risks of the procedure including death, infection, stroke, myocardial infarction, bleeding, blood transfusion have all been discussed specifically.  I have quoted Joya GaskinsSusan J Massimo a 8 % of perioperative mortality and a complication rate as high as 35 %. The patient's questions have been answered.Joya GaskinsSusan J Sarafian is willing  to proceed with the planned procedure.   55  min face to face time reviewed history, exam, findings  and reviewing proposed rx with patient, 20 min reviewing all studies films and charting  Delight OvensEdward B Willadeen Colantuono MD      [address] [place] [id number] Office [phone]   Beeper [phone]

## 2013-06-07 NOTE — H&P (View-Only) (Signed)
    Subjective:  Denies CP or dyspnea   Objective:  Filed Vitals:   06/05/13 1756 06/05/13 2100 06/06/13 0557 06/06/13 0859  BP: 106/62 99/62 110/66 107/65  Pulse: 90 86 85 86  Temp:  98.3 F (36.8 C) 97.7 F (36.5 C)   TempSrc:  Oral Oral   Resp:   18   Height:      Weight:   139 lb 9.6 oz (63.322 kg)   SpO2:  96% 97%     Intake/Output from previous day:  Intake/Output Summary (Last 24 hours) at 06/06/13 1103 Last data filed at 06/06/13 6415  Gross per 24 hour  Intake   1000 ml  Output    800 ml  Net    200 ml    Physical Exam: Physical exam: Well-developed well-nourished in no acute distress.  Skin is warm and dry.  HEENT is normal.  Neck is supple. Chest with diminished BS bases Cardiovascular exam is regular rate and rhythm.  Abdominal exam nontender or distended. No masses palpated. Extremities show no edema. neuro grossly intact    Lab Results: Basic Metabolic Panel:  Recent Labs  83/09/40 0515 06/06/13 0518  NA 135* 136*  K 4.3 4.0  CL 95* 96  CO2 30 30  GLUCOSE 220* 239*  BUN 25* 24*  CREATININE 0.59 0.64  CALCIUM 9.7 10.1   CBC:  Recent Labs  06/06/13 0518  WBC 6.0  HGB 12.9  HCT 38.3  MCV 91.8  PLT 214     Assessment/Plan:  1 acute systolic congestive heart failure-symptoms much improved. Continue present dose of Lasix and follow. Follow renal function. Hold lasix tonight and in AM prior to cath 2 cardiomyopathy-with wall motion abnormalities ischemia likely. Plan for right and left cardiac catheterization on Monday. Continue ACE inhibitor. Increase Coreg to 6.25 mg by mouth twice a day. 3 diabetes mellitus-management per primary care. 4 history of coronary artery disease-continue aspirin and statin.  Isabel [name] 06/06/2013, 11:03 AM

## 2013-06-08 ENCOUNTER — Encounter (HOSPITAL_COMMUNITY): Payer: BC Managed Care – PPO | Admitting: Certified Registered Nurse Anesthetist

## 2013-06-08 ENCOUNTER — Inpatient Hospital Stay (HOSPITAL_COMMUNITY): Payer: BC Managed Care – PPO

## 2013-06-08 ENCOUNTER — Inpatient Hospital Stay (HOSPITAL_COMMUNITY): Payer: BC Managed Care – PPO | Admitting: Certified Registered Nurse Anesthetist

## 2013-06-08 ENCOUNTER — Encounter (HOSPITAL_COMMUNITY)
Admission: EM | Disposition: A | Discharge status: Home / Self Care | Payer: BC Managed Care – PPO | Source: Home / Self Care | Attending: Cardiothoracic Surgery

## 2013-06-08 DIAGNOSIS — Z951 Presence of aortocoronary bypass graft: Secondary | ICD-10-CM

## 2013-06-08 HISTORY — PX: INTRAOPERATIVE TRANSESOPHAGEAL ECHOCARDIOGRAM: SHX5062

## 2013-06-08 HISTORY — PX: CORONARY ARTERY BYPASS GRAFT: SHX141

## 2013-06-08 LAB — POCT I-STAT 3, ART BLOOD GAS (G3+)
Acid-base deficit: 2 mmol/L (ref 0.0–2.0)
Acid-base deficit: 3 mmol/L — ABNORMAL HIGH (ref 0.0–2.0)
Bicarbonate: 25.3 mEq/L — ABNORMAL HIGH (ref 20.0–24.0)
Bicarbonate: 22.9 mEq/L (ref 20.0–24.0)
Bicarbonate: 22.9 mEq/L (ref 20.0–24.0)
Bicarbonate: 25.2 mEq/L — ABNORMAL HIGH (ref 20.0–24.0)
O2 Saturation: 100 %
O2 Saturation: 91 %
O2 Saturation: 97 %
O2 Saturation: 99 %
Patient temperature: 36.6
Patient temperature: 37.4
Patient temperature: 37.6
TCO2: 27 mmol/L (ref 0–100)
TCO2: 24 mmol/L (ref 0–100)
TCO2: 24 mmol/L (ref 0–100)
TCO2: 26 mmol/L (ref 0–100)
pCO2 arterial: 42.1 mmHg (ref 35.0–45.0)
pCO2 arterial: 39.8 mmHg (ref 35.0–45.0)
pCO2 arterial: 43.7 mmHg (ref 35.0–45.0)
pCO2 arterial: 42.7 mmHg (ref 35.0–45.0)
pH, Arterial: 7.386 (ref 7.350–7.450)
pH, Arterial: 7.366 (ref 7.350–7.450)
pH, Arterial: 7.329 — ABNORMAL LOW (ref 7.350–7.450)
pH, Arterial: 7.382 (ref 7.350–7.450)
pO2, Arterial: 283 mmHg — ABNORMAL HIGH (ref 80.0–100.0)
pO2, Arterial: 61 mmHg — ABNORMAL LOW (ref 80.0–100.0)
pO2, Arterial: 96 mmHg (ref 80.0–100.0)
pO2, Arterial: 120 mmHg — ABNORMAL HIGH (ref 80.0–100.0)

## 2013-06-08 LAB — POCT I-STAT 4, (NA,K, GLUC, HGB,HCT)
Glucose, Bld: 192 mg/dL — ABNORMAL HIGH (ref 70–99)
Glucose, Bld: 152 mg/dL — ABNORMAL HIGH (ref 70–99)
Glucose, Bld: 186 mg/dL — ABNORMAL HIGH (ref 70–99)
Glucose, Bld: 127 mg/dL — ABNORMAL HIGH (ref 70–99)
Glucose, Bld: 173 mg/dL — ABNORMAL HIGH (ref 70–99)
Glucose, Bld: 140 mg/dL — ABNORMAL HIGH (ref 70–99)
HCT: 33 % — ABNORMAL LOW (ref 36.0–46.0)
HCT: 24 % — ABNORMAL LOW (ref 36.0–46.0)
HCT: 33 % — ABNORMAL LOW (ref 36.0–46.0)
HCT: 22 % — ABNORMAL LOW (ref 36.0–46.0)
HCT: 25 % — ABNORMAL LOW (ref 36.0–46.0)
HCT: 35 % — ABNORMAL LOW (ref 36.0–46.0)
Hemoglobin: 11.2 g/dL — ABNORMAL LOW (ref 12.0–15.0)
Hemoglobin: 11.2 g/dL — ABNORMAL LOW (ref 12.0–15.0)
Hemoglobin: 8.2 g/dL — ABNORMAL LOW (ref 12.0–15.0)
Hemoglobin: 7.5 g/dL — ABNORMAL LOW (ref 12.0–15.0)
Hemoglobin: 8.5 g/dL — ABNORMAL LOW (ref 12.0–15.0)
Hemoglobin: 11.9 g/dL — ABNORMAL LOW (ref 12.0–15.0)
Potassium: 4.2 mEq/L (ref 3.7–5.3)
Potassium: 3.4 mEq/L — ABNORMAL LOW (ref 3.7–5.3)
Potassium: 4.1 mEq/L (ref 3.7–5.3)
Potassium: 4.7 mEq/L (ref 3.7–5.3)
Potassium: 3.9 mEq/L (ref 3.7–5.3)
Potassium: 3.4 mEq/L — ABNORMAL LOW (ref 3.7–5.3)
Sodium: 138 mEq/L (ref 137–147)
Sodium: 140 mEq/L (ref 137–147)
Sodium: 141 mEq/L (ref 137–147)
Sodium: 140 mEq/L (ref 137–147)
Sodium: 138 mEq/L (ref 137–147)
Sodium: 143 mEq/L (ref 137–147)

## 2013-06-08 LAB — CBC
HCT: 36.2 % (ref 36.0–46.0)
HCT: 33.3 % — ABNORMAL LOW (ref 36.0–46.0)
HCT: 30.7 % — ABNORMAL LOW (ref 36.0–46.0)
HEMOGLOBIN: 12.2 g/dL (ref 12.0–15.0)
Hemoglobin: 11.3 g/dL — ABNORMAL LOW (ref 12.0–15.0)
Hemoglobin: 10.3 g/dL — ABNORMAL LOW (ref 12.0–15.0)
MCH: 30.8 pg (ref 26.0–34.0)
MCH: 31 pg (ref 26.0–34.0)
MCH: 30.8 pg (ref 26.0–34.0)
MCHC: 33.7 g/dL (ref 30.0–36.0)
MCHC: 33.9 g/dL (ref 30.0–36.0)
MCHC: 33.6 g/dL (ref 30.0–36.0)
MCV: 91.4 fL (ref 78.0–100.0)
MCV: 91.5 fL (ref 78.0–100.0)
MCV: 91.9 fL (ref 78.0–100.0)
PLATELETS: 144 10*3/uL — AB (ref 150–400)
Platelets: 202 10*3/uL (ref 150–400)
Platelets: 145 10*3/uL — ABNORMAL LOW (ref 150–400)
RBC: 3.96 MIL/uL (ref 3.87–5.11)
RBC: 3.64 MIL/uL — ABNORMAL LOW (ref 3.87–5.11)
RBC: 3.34 MIL/uL — ABNORMAL LOW (ref 3.87–5.11)
RDW: 14 % (ref 11.5–15.5)
RDW: 14.2 % (ref 11.5–15.5)
RDW: 14.3 % (ref 11.5–15.5)
WBC: 6.9 10*3/uL (ref 4.0–10.5)
WBC: 9.8 10*3/uL (ref 4.0–10.5)
WBC: 11.5 10*3/uL — ABNORMAL HIGH (ref 4.0–10.5)

## 2013-06-08 LAB — PLATELET COUNT: Platelets: 125 10*3/uL — ABNORMAL LOW (ref 150–400)

## 2013-06-08 LAB — CREATININE, SERUM
Creatinine, Ser: 0.51 mg/dL (ref 0.50–1.10)
GFR calc Af Amer: >90 mL/min (ref >90)
GFR calc non Af Amer: >90 mL/min (ref >90)

## 2013-06-08 LAB — HEMOGLOBIN AND HEMATOCRIT, BLOOD
HCT: 22.4 % — ABNORMAL LOW (ref 36.0–46.0)
Hemoglobin: 7.7 g/dL — ABNORMAL LOW (ref 12.0–15.0)

## 2013-06-08 LAB — POCT I-STAT, CHEM 8
BUN: 8 mg/dL (ref 6–23)
Calcium, Ion: 1.19 mmol/L (ref 1.12–1.23)
Chloride: 107 mEq/L (ref 96–112)
Creatinine, Ser: 0.5 mg/dL (ref 0.50–1.10)
Glucose, Bld: 124 mg/dL — ABNORMAL HIGH (ref 70–99)
HCT: 30 % — ABNORMAL LOW (ref 36.0–46.0)
Hemoglobin: 10.2 g/dL — ABNORMAL LOW (ref 12.0–15.0)
Potassium: 4.1 mEq/L (ref 3.7–5.3)
Sodium: 144 mEq/L (ref 137–147)
TCO2: 23 mmol/L (ref 0–100)

## 2013-06-08 LAB — BASIC METABOLIC PANEL
BUN: 15 mg/dL (ref 6–23)
CO2: 27 mEq/L (ref 19–32)
Calcium: 9.2 mg/dL (ref 8.4–10.5)
Chloride: 101 mEq/L (ref 96–112)
Creatinine, Ser: 0.54 mg/dL (ref 0.50–1.10)
GFR calc Af Amer: >90 mL/min (ref >90)
GFR calc non Af Amer: >90 mL/min (ref >90)
Glucose, Bld: 191 mg/dL — ABNORMAL HIGH (ref 70–99)
Potassium: 3.9 mEq/L (ref 3.7–5.3)
Sodium: 139 mEq/L (ref 137–147)

## 2013-06-08 LAB — MAGNESIUM: Magnesium: 3.1 mg/dL — ABNORMAL HIGH (ref 1.5–2.5)

## 2013-06-08 LAB — PREPARE RBC (CROSSMATCH)

## 2013-06-08 LAB — GLUCOSE, CAPILLARY: Glucose-Capillary: 180 mg/dL — ABNORMAL HIGH (ref 70–99)

## 2013-06-08 LAB — PROTIME-INR
INR: 1.28 (ref 0.00–1.49)
Prothrombin Time: 15.7 seconds — ABNORMAL HIGH (ref 11.6–15.2)

## 2013-06-08 LAB — APTT: APTT: 30 s (ref 24–37)

## 2013-06-08 SURGERY — CORONARY ARTERY BYPASS GRAFTING (CABG)
Anesthesia: General | Site: Chest

## 2013-06-08 MED ORDER — SUCCINYLCHOLINE CHLORIDE 20 MG/ML IJ SOLN
INTRAMUSCULAR | Status: AC
  Filled 2013-06-08: qty 1

## 2013-06-08 MED ORDER — LACTATED RINGERS IV SOLN: 500.0000 mL | Freq: Once | INTRAVENOUS | Status: AC | PRN

## 2013-06-08 MED ORDER — PANTOPRAZOLE SODIUM 40 MG PO TBEC
40.0000 mg | DELAYED_RELEASE_TABLET | Freq: Every day | ORAL | Status: DC
  Administered 2013-06-10 – 2013-06-13 (×4): 40 mg via ORAL
  Filled 2013-06-08 (×4): qty 1

## 2013-06-08 MED ORDER — SODIUM CHLORIDE 0.9 % IV SOLN
10.0000 g | INTRAVENOUS | Status: DC | PRN
  Administered 2013-06-08: 5 g/h via INTRAVENOUS

## 2013-06-08 MED ORDER — MILRINONE IN DEXTROSE 20 MG/100ML IV SOLN
0.1250 ug/kg/min | INTRAVENOUS | Status: AC
  Administered 2013-06-08 – 2013-06-09 (×2): 0.375 ug/kg/min via INTRAVENOUS
  Administered 2013-06-10: 0.125 ug/kg/min via INTRAVENOUS
  Filled 2013-06-08 (×3): qty 100

## 2013-06-08 MED ORDER — VANCOMYCIN HCL IN DEXTROSE 1-5 GM/200ML-% IV SOLN
1000.0000 mg | Freq: Once | INTRAVENOUS | Status: AC
  Administered 2013-06-08: 1000 mg via INTRAVENOUS
  Filled 2013-06-08: qty 200

## 2013-06-08 MED ORDER — ROCURONIUM BROMIDE 100 MG/10ML IV SOLN
INTRAVENOUS | Status: DC | PRN
  Administered 2013-06-08: 30 mg via INTRAVENOUS
  Administered 2013-06-08: 50 mg via INTRAVENOUS
  Administered 2013-06-08: 80 mg via INTRAVENOUS
  Administered 2013-06-08: 20 mg via INTRAVENOUS

## 2013-06-08 MED ORDER — DOCUSATE SODIUM 100 MG PO CAPS
200.0000 mg | ORAL_CAPSULE | Freq: Every day | ORAL | Status: DC
  Administered 2013-06-09 – 2013-06-13 (×5): 200 mg via ORAL
  Filled 2013-06-08 (×5): qty 2

## 2013-06-08 MED ORDER — ACETAMINOPHEN 160 MG/5ML PO SOLN: 650.0000 mg | Freq: Once | ORAL | Status: AC

## 2013-06-08 MED ORDER — MILRINONE IN DEXTROSE 20 MG/100ML IV SOLN
0.1250 ug/kg/min | INTRAVENOUS | Status: DC
  Filled 2013-06-08: qty 100

## 2013-06-08 MED ORDER — LACTATED RINGERS IV SOLN
INTRAVENOUS | Status: DC | PRN
  Administered 2013-06-08 (×2): via INTRAVENOUS

## 2013-06-08 MED ORDER — MIDAZOLAM HCL 2 MG/2ML IJ SOLN: 2.0000 mg | INTRAMUSCULAR | Status: DC | PRN

## 2013-06-08 MED ORDER — SODIUM CHLORIDE 0.9 % IV SOLN
100.0000 [IU] | INTRAVENOUS | Status: DC | PRN
  Administered 2013-06-08: 2.9 [IU]/h via INTRAVENOUS

## 2013-06-08 MED ORDER — FENTANYL CITRATE 0.05 MG/ML IJ SOLN
INTRAMUSCULAR | Status: AC
  Filled 2013-06-08: qty 5

## 2013-06-08 MED ORDER — LACTATED RINGERS IV SOLN: INTRAVENOUS | Status: DC

## 2013-06-08 MED ORDER — FENTANYL CITRATE 0.05 MG/ML IJ SOLN
INTRAMUSCULAR | Status: AC
  Filled 2013-06-08: qty 2

## 2013-06-08 MED ORDER — MORPHINE SULFATE 2 MG/ML IJ SOLN: 1.0000 mg | INTRAMUSCULAR | Status: AC | PRN

## 2013-06-08 MED ORDER — DEXMEDETOMIDINE HCL IN NACL 200 MCG/50ML IV SOLN: 0.1000 ug/kg/h | INTRAVENOUS | Status: DC

## 2013-06-08 MED ORDER — SODIUM CHLORIDE 0.9 % IV SOLN
200.0000 ug | INTRAVENOUS | Status: DC | PRN
  Administered 2013-06-08: 0.2 ug/kg/h via INTRAVENOUS

## 2013-06-08 MED ORDER — PHENYLEPHRINE HCL 10 MG/ML IJ SOLN
0.0000 ug/min | INTRAMUSCULAR | Status: DC
  Administered 2013-06-08: 50 ug/min via INTRAVENOUS
  Administered 2013-06-09: 25 ug/min via INTRAVENOUS
  Administered 2013-06-09: 5 ug/min via INTRAVENOUS
  Filled 2013-06-08 (×5): qty 2

## 2013-06-08 MED ORDER — PROPOFOL 10 MG/ML IV BOLUS
INTRAVENOUS | Status: AC
  Filled 2013-06-08: qty 20

## 2013-06-08 MED ORDER — PHENYLEPHRINE HCL 10 MG/ML IJ SOLN
20.0000 mg | INTRAVENOUS | Status: DC | PRN
  Administered 2013-06-08: 13.3 ug/min via INTRAVENOUS

## 2013-06-08 MED ORDER — POTASSIUM CHLORIDE 10 MEQ/50ML IV SOLN
10.0000 meq | INTRAVENOUS | Status: AC
  Administered 2013-06-08 (×3): 10 meq via INTRAVENOUS

## 2013-06-08 MED ORDER — DEXMEDETOMIDINE HCL IN NACL 200 MCG/50ML IV SOLN
INTRAVENOUS | Status: AC
  Filled 2013-06-08: qty 50

## 2013-06-08 MED ORDER — METOPROLOL TARTRATE 12.5 MG HALF TABLET
12.5000 mg | ORAL_TABLET | Freq: Two times a day (BID) | ORAL | Status: DC
  Filled 2013-06-08 (×7): qty 1

## 2013-06-08 MED ORDER — MIDAZOLAM HCL 10 MG/2ML IJ SOLN
INTRAMUSCULAR | Status: AC
  Filled 2013-06-08: qty 2

## 2013-06-08 MED ORDER — ASPIRIN EC 325 MG PO TBEC
325.0000 mg | DELAYED_RELEASE_TABLET | Freq: Every day | ORAL | Status: DC
  Administered 2013-06-09 – 2013-06-13 (×5): 325 mg via ORAL
  Filled 2013-06-08 (×6): qty 1

## 2013-06-08 MED ORDER — ACETAMINOPHEN 160 MG/5ML PO SOLN: 1000.0000 mg | Freq: Four times a day (QID) | ORAL | Status: AC

## 2013-06-08 MED ORDER — PROTAMINE SULFATE 10 MG/ML IV SOLN
INTRAVENOUS | Status: AC
  Filled 2013-06-08: qty 25

## 2013-06-08 MED ORDER — HEPARIN SODIUM (PORCINE) 1000 UNIT/ML IJ SOLN
INTRAMUSCULAR | Status: DC | PRN
  Administered 2013-06-08: 20 mL via INTRAVENOUS

## 2013-06-08 MED ORDER — DOPAMINE-DEXTROSE 3.2-5 MG/ML-% IV SOLN
3.0000 ug/kg/min | INTRAVENOUS | Status: DC
  Administered 2013-06-11: 3 ug/kg/min via INTRAVENOUS
  Filled 2013-06-08: qty 250

## 2013-06-08 MED ORDER — INSULIN REGULAR BOLUS VIA INFUSION
0.0000 [IU] | Freq: Three times a day (TID) | INTRAVENOUS | Status: DC
  Administered 2013-06-09: 3 [IU] via INTRAVENOUS
  Administered 2013-06-09: 0.5 [IU] via INTRAVENOUS
  Filled 2013-06-08: qty 10

## 2013-06-08 MED ORDER — PROPOFOL 10 MG/ML IV BOLUS
INTRAVENOUS | Status: DC | PRN
  Administered 2013-06-08: 60 mg via INTRAVENOUS

## 2013-06-08 MED ORDER — SODIUM CHLORIDE 0.9 % IJ SOLN
3.0000 mL | Freq: Two times a day (BID) | INTRAMUSCULAR | Status: DC
  Administered 2013-06-09 – 2013-06-12 (×6): 3 mL via INTRAVENOUS
  Administered 2013-06-12: 22:00:00 via INTRAVENOUS
  Administered 2013-06-13 (×2): 3 mL via INTRAVENOUS

## 2013-06-08 MED ORDER — MILRINONE IN DEXTROSE 20 MG/100ML IV SOLN
INTRAVENOUS | Status: DC | PRN
  Administered 2013-06-08: 50 ug/kg/min via INTRAVENOUS

## 2013-06-08 MED ORDER — OXYCODONE HCL 5 MG PO TABS
5.0000 mg | ORAL_TABLET | ORAL | Status: DC | PRN
  Administered 2013-06-08: 5 mg via ORAL
  Administered 2013-06-09 (×2): 10 mg via ORAL
  Administered 2013-06-10 – 2013-06-14 (×5): 5 mg via ORAL
  Filled 2013-06-08 (×3): qty 1
  Filled 2013-06-08: qty 2
  Filled 2013-06-08 (×4): qty 1
  Filled 2013-06-08: qty 2

## 2013-06-08 MED ORDER — SODIUM CHLORIDE 0.45 % IV SOLN
INTRAVENOUS | Status: DC
  Administered 2013-06-08: 14:00:00 via INTRAVENOUS

## 2013-06-08 MED ORDER — NITROGLYCERIN IN D5W 200-5 MCG/ML-% IV SOLN: 0.0000 ug/min | INTRAVENOUS | Status: DC

## 2013-06-08 MED ORDER — ASPIRIN 81 MG PO CHEW: 324.0000 mg | CHEWABLE_TABLET | Freq: Every day | ORAL | Status: DC

## 2013-06-08 MED ORDER — FAMOTIDINE IN NACL 20-0.9 MG/50ML-% IV SOLN
20.0000 mg | Freq: Two times a day (BID) | INTRAVENOUS | Status: AC
  Administered 2013-06-08: 20 mg via INTRAVENOUS

## 2013-06-08 MED ORDER — LACTATED RINGERS IV SOLN
INTRAVENOUS | Status: DC | PRN
  Administered 2013-06-08: 07:00:00 via INTRAVENOUS

## 2013-06-08 MED ORDER — ACETAMINOPHEN 500 MG PO TABS
1000.0000 mg | ORAL_TABLET | Freq: Four times a day (QID) | ORAL | Status: AC
  Administered 2013-06-09 – 2013-06-13 (×19): 1000 mg via ORAL
  Filled 2013-06-08 (×21): qty 2

## 2013-06-08 MED ORDER — DOPAMINE-DEXTROSE 3.2-5 MG/ML-% IV SOLN
INTRAVENOUS | Status: DC | PRN
  Administered 2013-06-08: 3 ug/kg/min via INTRAVENOUS

## 2013-06-08 MED ORDER — ROCURONIUM BROMIDE 50 MG/5ML IV SOLN
INTRAVENOUS | Status: AC
  Filled 2013-06-08: qty 2

## 2013-06-08 MED ORDER — EPHEDRINE SULFATE 50 MG/ML IJ SOLN
INTRAMUSCULAR | Status: AC
  Filled 2013-06-08: qty 1

## 2013-06-08 MED ORDER — SODIUM CHLORIDE 0.9 % IJ SOLN
INTRAMUSCULAR | Status: AC
  Filled 2013-06-08: qty 30

## 2013-06-08 MED ORDER — ROCURONIUM BROMIDE 50 MG/5ML IV SOLN
INTRAVENOUS | Status: AC
  Filled 2013-06-08: qty 1

## 2013-06-08 MED ORDER — FENTANYL CITRATE 0.05 MG/ML IJ SOLN
INTRAMUSCULAR | Status: DC | PRN
  Administered 2013-06-08: 150 ug via INTRAVENOUS
  Administered 2013-06-08: 100 ug via INTRAVENOUS
  Administered 2013-06-08 (×2): 250 ug via INTRAVENOUS
  Administered 2013-06-08 (×2): 100 ug via INTRAVENOUS
  Administered 2013-06-08: 250 ug via INTRAVENOUS
  Administered 2013-06-08: 150 ug via INTRAVENOUS
  Administered 2013-06-08: 100 ug via INTRAVENOUS
  Administered 2013-06-08: 150 ug via INTRAVENOUS

## 2013-06-08 MED ORDER — HEPARIN SODIUM (PORCINE) 1000 UNIT/ML IJ SOLN
INTRAMUSCULAR | Status: AC
Start: 2013-06-08 — End: 2013-06-08
  Filled 2013-06-08: qty 1

## 2013-06-08 MED ORDER — PROTAMINE SULFATE 10 MG/ML IV SOLN
INTRAVENOUS | Status: DC | PRN
  Administered 2013-06-08 (×3): 20 mg via INTRAVENOUS
  Administered 2013-06-08: 10 mg via INTRAVENOUS
  Administered 2013-06-08 (×4): 20 mg via INTRAVENOUS
  Administered 2013-06-08: 10 mg via INTRAVENOUS
  Administered 2013-06-08: 20 mg via INTRAVENOUS

## 2013-06-08 MED ORDER — MIDAZOLAM HCL 5 MG/5ML IJ SOLN
INTRAMUSCULAR | Status: DC | PRN
  Administered 2013-06-08 (×4): 2 mg via INTRAVENOUS
  Administered 2013-06-08: 4 mg via INTRAVENOUS
  Administered 2013-06-08: 2 mg via INTRAVENOUS
  Administered 2013-06-08: 4 mg via INTRAVENOUS
  Administered 2013-06-08 (×2): 2 mg via INTRAVENOUS

## 2013-06-08 MED ORDER — MAGNESIUM SULFATE 4000MG/100ML IJ SOLN
4.0000 g | Freq: Once | INTRAMUSCULAR | Status: AC
  Administered 2013-06-08: 4 g via INTRAVENOUS
  Filled 2013-06-08: qty 100

## 2013-06-08 MED ORDER — LIDOCAINE HCL (CARDIAC) 20 MG/ML IV SOLN
INTRAVENOUS | Status: DC | PRN
  Administered 2013-06-08: 100 mg via INTRAVENOUS

## 2013-06-08 MED ORDER — NITROGLYCERIN IN D5W 200-5 MCG/ML-% IV SOLN
INTRAVENOUS | Status: DC | PRN
  Administered 2013-06-08: 10 ug/min via INTRAVENOUS

## 2013-06-08 MED ORDER — ALBUMIN HUMAN 5 % IV SOLN
250.0000 mL | INTRAVENOUS | Status: AC | PRN
  Administered 2013-06-08 – 2013-06-09 (×4): 250 mL via INTRAVENOUS
  Filled 2013-06-08 (×2): qty 250

## 2013-06-08 MED ORDER — SODIUM CHLORIDE 0.9 % IJ SOLN
3.0000 mL | INTRAMUSCULAR | Status: DC | PRN
  Administered 2013-06-10 – 2013-06-11 (×3): 3 mL via INTRAVENOUS

## 2013-06-08 MED ORDER — PROTAMINE SULFATE 10 MG/ML IV SOLN
INTRAVENOUS | Status: AC
  Filled 2013-06-08: qty 15

## 2013-06-08 MED ORDER — HEMOSTATIC AGENTS (NO CHARGE) OPTIME
TOPICAL | Status: DC | PRN
  Administered 2013-06-08: 1 via TOPICAL

## 2013-06-08 MED ORDER — SODIUM CHLORIDE 0.9 % IV SOLN: 250.0000 mL | INTRAVENOUS | Status: DC

## 2013-06-08 MED ORDER — ALBUMIN HUMAN 5 % IV SOLN
INTRAVENOUS | Status: DC | PRN
  Administered 2013-06-08: 12:00:00 via INTRAVENOUS

## 2013-06-08 MED ORDER — METOPROLOL TARTRATE 25 MG/10 ML ORAL SUSPENSION
12.5000 mg | Freq: Two times a day (BID) | ORAL | Status: DC
  Administered 2013-06-08: 12.5 mg
  Filled 2013-06-08 (×7): qty 5

## 2013-06-08 MED ORDER — MORPHINE SULFATE 2 MG/ML IJ SOLN
2.0000 mg | INTRAMUSCULAR | Status: DC | PRN
  Administered 2013-06-08 (×2): 2 mg via INTRAVENOUS
  Administered 2013-06-09: 4 mg via INTRAVENOUS
  Administered 2013-06-09: 2 mg via INTRAVENOUS
  Administered 2013-06-09 (×2): 4 mg via INTRAVENOUS
  Administered 2013-06-10: 2 mg via INTRAVENOUS
  Administered 2013-06-10: 4 mg via INTRAVENOUS
  Filled 2013-06-08 (×2): qty 1
  Filled 2013-06-08 (×3): qty 2
  Filled 2013-06-08 (×2): qty 1
  Filled 2013-06-08 (×2): qty 2

## 2013-06-08 MED ORDER — PHENYLEPHRINE HCL 10 MG/ML IJ SOLN
10.0000 mg | INTRAVENOUS | Status: DC | PRN
  Administered 2013-06-08: 40 ug/min via INTRAVENOUS

## 2013-06-08 MED ORDER — ONDANSETRON HCL 4 MG/2ML IJ SOLN
4.0000 mg | Freq: Four times a day (QID) | INTRAMUSCULAR | Status: DC | PRN
  Administered 2013-06-09 – 2013-06-12 (×5): 4 mg via INTRAVENOUS
  Filled 2013-06-08 (×5): qty 2

## 2013-06-08 MED ORDER — METOPROLOL TARTRATE 1 MG/ML IV SOLN: 2.5000 mg | INTRAVENOUS | Status: DC | PRN

## 2013-06-08 MED ORDER — 0.9 % SODIUM CHLORIDE (POUR BTL) OPTIME
TOPICAL | Status: DC | PRN
  Administered 2013-06-08: 2000 mL

## 2013-06-08 MED ORDER — SODIUM CHLORIDE 0.9 % IJ SOLN
OROMUCOSAL | Status: DC | PRN
  Administered 2013-06-08: 09:00:00 via TOPICAL

## 2013-06-08 MED ORDER — SODIUM CHLORIDE 0.9 % IV SOLN: INTRAVENOUS | Status: DC

## 2013-06-08 MED ORDER — SODIUM CHLORIDE 0.9 % IV SOLN
INTRAVENOUS | Status: DC
  Administered 2013-06-08: 2.8 [IU]/h via INTRAVENOUS
  Filled 2013-06-08 (×2): qty 1

## 2013-06-08 MED ORDER — LEVALBUTEROL HCL 0.63 MG/3ML IN NEBU
0.6300 mg | INHALATION_SOLUTION | Freq: Four times a day (QID) | RESPIRATORY_TRACT | Status: DC
  Administered 2013-06-08 – 2013-06-09 (×6): 0.63 mg via RESPIRATORY_TRACT
  Filled 2013-06-08 (×9): qty 3

## 2013-06-08 MED ORDER — BISACODYL 5 MG PO TBEC
10.0000 mg | DELAYED_RELEASE_TABLET | Freq: Every day | ORAL | Status: DC
  Administered 2013-06-09 – 2013-06-10 (×2): 10 mg via ORAL
  Administered 2013-06-11: 5 mg via ORAL
  Administered 2013-06-12 – 2013-06-13 (×2): 10 mg via ORAL
  Filled 2013-06-08 (×5): qty 2

## 2013-06-08 MED ORDER — BISACODYL 10 MG RE SUPP: 10.0000 mg | Freq: Every day | RECTAL | Status: DC

## 2013-06-08 MED ORDER — MIDAZOLAM HCL 2 MG/2ML IJ SOLN
INTRAMUSCULAR | Status: AC
  Filled 2013-06-08: qty 2

## 2013-06-08 MED ORDER — LIDOCAINE HCL (CARDIAC) 20 MG/ML IV SOLN
INTRAVENOUS | Status: AC
  Filled 2013-06-08: qty 5

## 2013-06-08 MED ORDER — ACETAMINOPHEN 650 MG RE SUPP
650.0000 mg | Freq: Once | RECTAL | Status: AC
  Administered 2013-06-08: 650 mg via RECTAL

## 2013-06-08 MED ORDER — METOCLOPRAMIDE HCL 5 MG/ML IJ SOLN
10.0000 mg | Freq: Four times a day (QID) | INTRAMUSCULAR | Status: AC
  Administered 2013-06-08 – 2013-06-09 (×3): 10 mg via INTRAVENOUS
  Filled 2013-06-08 (×3): qty 2

## 2013-06-08 MED ORDER — DEXTROSE 5 % IV SOLN
1.5000 g | Freq: Two times a day (BID) | INTRAVENOUS | Status: AC
  Administered 2013-06-08 – 2013-06-10 (×4): 1.5 g via INTRAVENOUS
  Filled 2013-06-08 (×4): qty 1.5

## 2013-06-08 SURGICAL SUPPLY — 81 items
APL SKNCLS STERI-STRIP NONHPOA (GAUZE/BANDAGES/DRESSINGS) ×2
ATTRACTOMAT 16X20 MAGNETIC DRP (DRAPES) ×3 IMPLANT
BAG DECANTER FOR FLEXI CONT (MISCELLANEOUS) ×3 IMPLANT
BANDAGE ELASTIC 4 VELCRO ST LF (GAUZE/BANDAGES/DRESSINGS) ×3 IMPLANT
BANDAGE ELASTIC 6 VELCRO ST LF (GAUZE/BANDAGES/DRESSINGS) ×3 IMPLANT
BANDAGE GAUZE ELAST BULKY 4 IN (GAUZE/BANDAGES/DRESSINGS) ×3 IMPLANT
BENZOIN TINCTURE PRP APPL 2/3 (GAUZE/BANDAGES/DRESSINGS) ×1 IMPLANT
BLADE STERNUM SYSTEM 6 (BLADE) ×3 IMPLANT
BLADE SURG 11 STRL SS (BLADE) ×1 IMPLANT
BNDG GAUZE ELAST 4 BULKY (GAUZE/BANDAGES/DRESSINGS) ×1 IMPLANT
CANISTER SUCTION 2500CC (MISCELLANEOUS) ×3 IMPLANT
CANN PRFSN .5XCNCT 15X34-48 (MISCELLANEOUS) ×2
CANNULA AORTIC HI-FLOW 6.5M20F (CANNULA) ×3 IMPLANT
CANNULA PRFSN .5XCNCT 15X34-48 (MISCELLANEOUS) ×2 IMPLANT
CANNULA VEN 2 STAGE (MISCELLANEOUS) ×3
CARDIAC SUCTION (MISCELLANEOUS) ×3 IMPLANT
CATH CPB KIT GERHARDT (MISCELLANEOUS) ×3 IMPLANT
CATH THORACIC 28FR (CATHETERS) ×3 IMPLANT
COVER SURGICAL LIGHT HANDLE (MISCELLANEOUS) ×3 IMPLANT
CRADLE DONUT ADULT HEAD (MISCELLANEOUS) ×3 IMPLANT
DRAIN CHANNEL 28F RND 3/8 FF (WOUND CARE) ×3 IMPLANT
DRAPE CARDIOVASCULAR INCISE (DRAPES) ×3
DRAPE SLUSH/WARMER DISC (DRAPES) ×3 IMPLANT
DRAPE SRG 135X102X78XABS (DRAPES) ×2 IMPLANT
DRSG AQUACEL AG ADV 3.5X14 (GAUZE/BANDAGES/DRESSINGS) ×3 IMPLANT
DRSG COVADERM 4X14 (GAUZE/BANDAGES/DRESSINGS) ×1 IMPLANT
ELECT BLADE 4.0 EZ CLEAN MEGAD (MISCELLANEOUS) ×3
ELECT REM PT RETURN 9FT ADLT (ELECTROSURGICAL) ×6
ELECTRODE BLDE 4.0 EZ CLN MEGD (MISCELLANEOUS) ×2 IMPLANT
ELECTRODE REM PT RTRN 9FT ADLT (ELECTROSURGICAL) ×4 IMPLANT
GLOVE BIO SURGEON STRL SZ 6.5 (GLOVE) ×12 IMPLANT
GLOVE BIOGEL M 7.0 STRL (GLOVE) ×3 IMPLANT
GLOVE BIOGEL M STER SZ 6 (GLOVE) ×4 IMPLANT
GOWN STRL REUS W/ TWL LRG LVL3 (GOWN DISPOSABLE) ×8 IMPLANT
GOWN STRL REUS W/TWL LRG LVL3 (GOWN DISPOSABLE) ×12
HEMOSTAT POWDER SURGIFOAM 1G (HEMOSTASIS) ×9 IMPLANT
HEMOSTAT SURGICEL 2X14 (HEMOSTASIS) ×4 IMPLANT
INSERT FOGARTY 61MM (MISCELLANEOUS) ×1 IMPLANT
KIT BASIN OR (CUSTOM PROCEDURE TRAY) ×3 IMPLANT
KIT ROOM TURNOVER OR (KITS) ×3 IMPLANT
KIT SUCTION CATH 14FR (SUCTIONS) ×6 IMPLANT
KIT VASOVIEW W/TROCAR VH 2000 (KITS) ×3 IMPLANT
LEAD PACING MYOCARDI (MISCELLANEOUS) ×3 IMPLANT
MARKER GRAFT CORONARY BYPASS (MISCELLANEOUS) ×9 IMPLANT
NS IRRIG 1000ML POUR BTL (IV SOLUTION) ×15 IMPLANT
PACK OPEN HEART (CUSTOM PROCEDURE TRAY) ×3 IMPLANT
PAD ARMBOARD 7.5X6 YLW CONV (MISCELLANEOUS) ×6 IMPLANT
PAD ELECT DEFIB RADIOL ZOLL (MISCELLANEOUS) ×3 IMPLANT
PENCIL BUTTON HOLSTER BLD 10FT (ELECTRODE) ×3 IMPLANT
PUNCH AORTIC ROTATE 4.5MM 8IN (MISCELLANEOUS) ×1 IMPLANT
SET CARDIOPLEGIA MPS 5001102 (MISCELLANEOUS) ×1 IMPLANT
SPONGE GAUZE 2X2 NONSTERILE (GAUZE/BANDAGES/DRESSINGS) ×3 IMPLANT
SPONGE GAUZE 4X4 12PLY (GAUZE/BANDAGES/DRESSINGS) ×6 IMPLANT
STRIP CLOSURE SKIN 1/2X4 (GAUZE/BANDAGES/DRESSINGS) ×1 IMPLANT
SUT BONE WAX W31G (SUTURE) ×3 IMPLANT
SUT MNCRL AB 4-0 PS2 18 (SUTURE) ×1 IMPLANT
SUT PROLENE 3 0 SH1 36 (SUTURE) ×3 IMPLANT
SUT PROLENE 4 0 TF (SUTURE) ×6 IMPLANT
SUT PROLENE 5 0 C 1 36 (SUTURE) ×1 IMPLANT
SUT PROLENE 6 0 C 1 30 (SUTURE) ×1 IMPLANT
SUT PROLENE 6 0 CC (SUTURE) ×7 IMPLANT
SUT PROLENE 7 0 BV1 MDA (SUTURE) ×3 IMPLANT
SUT PROLENE 8 0 BV175 6 (SUTURE) ×9 IMPLANT
SUT SILK 2 0 SH CR/8 (SUTURE) ×1 IMPLANT
SUT STEEL 6MS V (SUTURE) ×3 IMPLANT
SUT STEEL SZ 6 DBL 3X14 BALL (SUTURE) ×3 IMPLANT
SUT VIC AB 1 CTX 18 (SUTURE) ×6 IMPLANT
SUT VIC AB 2-0 CT1 27 (SUTURE) ×3
SUT VIC AB 2-0 CT1 TAPERPNT 27 (SUTURE) IMPLANT
SUTURE E-PAK OPEN HEART (SUTURE) ×3 IMPLANT
SYSTEM SAHARA CHEST DRAIN ATS (WOUND CARE) ×3 IMPLANT
TAPE CLOTH SURG 4X10 WHT LF (GAUZE/BANDAGES/DRESSINGS) ×1 IMPLANT
TOWEL OR 17X24 6PK STRL BLUE (TOWEL DISPOSABLE) ×6 IMPLANT
TOWEL OR 17X26 10 PK STRL BLUE (TOWEL DISPOSABLE) ×6 IMPLANT
TRAY CATH LUMEN 1 20CM STRL (SET/KITS/TRAYS/PACK) ×1 IMPLANT
TRAY FOLEY IC TEMP SENS 14FR (CATHETERS) ×3 IMPLANT
TUBE FEEDING 8FR 16IN STR KANG (MISCELLANEOUS) ×3 IMPLANT
TUBING ART PRESS 48 MALE/FEM (TUBING) ×2 IMPLANT
TUBING INSUFFLATION 10FT LAP (TUBING) ×3 IMPLANT
UNDERPAD 30X30 INCONTINENT (UNDERPADS AND DIAPERS) ×3 IMPLANT
WATER STERILE IRR 1000ML POUR (IV SOLUTION) ×6 IMPLANT

## 2013-06-08 NOTE — Procedures (Signed)
Extubation Procedure Note  Patient Details:   Name: Isabel [name] DOB: 27-Mar-1966 MRN: [id number]   Airway Documentation:     Evaluation  O2 sats: stable throughout Complications: No apparent complications Patient did tolerate procedure well. Bilateral Breath Sounds: Clear   Yes Patient tolerated the rapid wean. VC .6 L and NIF of -34 achieved. Positive for cuff leak. Extubated to a 3 Lpm nasal cannula. No signs of dyspnea or stridor. Patient instructed on the IS and achieved times 3. Patient resting comfortably. RN at bedside.  Ancil Boozer 06/08/2013, 5:43 PM

## 2013-06-08 NOTE — Anesthesia Postprocedure Evaluation (Signed)
  Anesthesia Post-op Note  Patient: Isabel [name]  Procedure(s) Performed: Procedure(s): CORONARY ARTERY BYPASS GRAFTING (CABG) times four using left internal mammary and right saphenous vein. (N/A) INTRAOPERATIVE TRANSESOPHAGEAL ECHOCARDIOGRAM (N/A)  Patient Location: SICU  Anesthesia Type:General  Level of Consciousness: sedated  Airway and Oxygen Therapy: Patient placed on Ventilator (see vital sign flow sheet for setting)  Post-op Pain: none  Post-op Assessment: Post-op Vital signs reviewed, Patient's Cardiovascular Status Stable, Respiratory Function Stable, Patent Airway and Pain level controlled  Post-op Vital Signs: Reviewed and stable  Complications: No apparent anesthesia complications

## 2013-06-08 NOTE — Anesthesia Procedure Notes (Signed)
Procedure Name: Intubation Date/Time: 06/08/2013 7:48 AM Performed by: Rogelia Boga Pre-anesthesia Checklist: Patient identified, Emergency Drugs available, Suction available, Patient being monitored and Timeout performed Patient Re-evaluated:Patient Re-evaluated prior to inductionOxygen Delivery Method: Circle system utilized Preoxygenation: Pre-oxygenation with 100% oxygen Intubation Type: IV induction Ventilation: Mask ventilation without difficulty and Oral airway inserted - appropriate to patient size Laryngoscope Size: Mac and 4 Grade View: Grade II Tube type: Oral Tube size: 8.0 mm Number of attempts: 1 Airway Equipment and Method: Stylet Placement Confirmation: ETT inserted through vocal cords under direct vision,  positive ETCO2 and breath sounds checked- equal and bilateral Secured at: 21 cm Tube secured with: Tape Dental Injury: Teeth and Oropharynx as per pre-operative assessment

## 2013-06-08 NOTE — Preoperative (Signed)
Beta Blockers   Reason not to administer Beta Blockers:Not Applicable, Pt took Metoprolol at 0517 this am

## 2013-06-08 NOTE — Transfer of Care (Signed)
Immediate Anesthesia Transfer of Care Note  Patient: Isabel [name]  Procedure(s) Performed: Procedure(s): CORONARY ARTERY BYPASS GRAFTING (CABG) times four using left internal mammary and right saphenous vein. (N/A) INTRAOPERATIVE TRANSESOPHAGEAL ECHOCARDIOGRAM (N/A)  Patient Location: SICU  Anesthesia Type:General  Level of Consciousness: sedated  Airway & Oxygen Therapy: Patient placed on Ventilator (see vital sign flow sheet for setting)  Post-op Assessment: Report given to PACU RN and Milly Jakob, RN  Post vital signs: Reviewed and stable  Complications: No apparent anesthesia complications

## 2013-06-08 NOTE — Brief Op Note (Addendum)
      [address]       [place] [address]             [phone]          06/08/2013  11:26 AM  PATIENT:  Isabel [name]  48 y.o. female  PRE-OPERATIVE DIAGNOSIS:  CAD, with sever lv dysfunction  POST-OPERATIVE DIAGNOSIS:  same  PROCEDURE:   CORONARY ARTERY BYPASS GRAFTING x 4 (LIMA-LAD, SVG-OM1-dCx, SVG-RCA) ENDOSCOPIC VEIN HARVEST RIGHT LEG  SURGEON:  Delight Ovens, MD  ASSISTANT: Coral Ceo, PA-C  ANESTHESIA:   general  PATIENT CONDITION:  ICU - intubated and hemodynamically stable.  PRE-OPERATIVE WEIGHT: 63 kg

## 2013-06-08 NOTE — Anesthesia Preprocedure Evaluation (Addendum)
Anesthesia Evaluation  Patient identified by MRN, date of birth, ID band Patient awake    Reviewed: Allergy & Precautions, H&P , NPO status , Patient's Chart, lab work & pertinent test results  Airway Mallampati: II TM Distance: >3 FB Neck ROM: full    Dental  (+) Teeth Intact, Chipped   Pulmonary former smoker,          Cardiovascular + CAD and +CHF + Valvular Problems/Murmurs AS and AI  Mild AS and mild AI by TTE this month.  EF reduced to 25-30% with large area of HK in the LV.   Neuro/Psych    GI/Hepatic   Endo/Other  diabetes, Poorly Controlled, Type 2  Renal/GU      Musculoskeletal   Abdominal   Peds  Hematology   Anesthesia Other Findings   Reproductive/Obstetrics                          Anesthesia Physical Anesthesia Plan  ASA: III  Anesthesia Plan: General   Post-op Pain Management:    Induction: Intravenous  Airway Management Planned: Oral ETT  Additional Equipment: Arterial line, CVP, PA Cath, TEE and Ultrasound Guidance Line Placement  Intra-op Plan:   Post-operative Plan: Post-operative intubation/ventilation  Informed Consent: I have reviewed the patients History and Physical, chart, labs and discussed the procedure including the risks, benefits and alternatives for the proposed anesthesia with the patient or authorized representative who has indicated his/her understanding and acceptance.   Dental advisory given  Plan Discussed with: CRNA, Anesthesiologist and Surgeon  Anesthesia Plan Comments:        Anesthesia Quick Evaluation

## 2013-06-08 NOTE — Progress Notes (Signed)
  Echocardiogram Echocardiogram Transesophageal (OR) has been performed.  Isabel [name] 06/08/2013, 10:57 AM

## 2013-06-08 NOTE — OR Nursing (Addendum)
1st call to SICU 1230.

## 2013-06-08 NOTE — OR Nursing (Signed)
2nd call to SICU 1254.

## 2013-06-08 NOTE — Progress Notes (Addendum)
CT surgery p.m. Rounds  Status post CABG doing well Patient extubated O2 sat 98% Stable hemodynamics on low-dose neo- Chest tubes with minimal drainage 6 hour postop labs are pending later this evening

## 2013-06-09 ENCOUNTER — Encounter (HOSPITAL_COMMUNITY): Payer: Self-pay | Admitting: Cardiothoracic Surgery

## 2013-06-09 ENCOUNTER — Inpatient Hospital Stay (HOSPITAL_COMMUNITY): Payer: BC Managed Care – PPO

## 2013-06-09 LAB — POCT I-STAT, CHEM 8
BUN: 13 mg/dL (ref 6–23)
Calcium, Ion: 1.23 mmol/L (ref 1.12–1.23)
Chloride: 106 mEq/L (ref 96–112)
Creatinine, Ser: 0.7 mg/dL (ref 0.50–1.10)
Glucose, Bld: 205 mg/dL — ABNORMAL HIGH (ref 70–99)
HCT: 27 % — ABNORMAL LOW (ref 36.0–46.0)
Hemoglobin: 9.2 g/dL — ABNORMAL LOW (ref 12.0–15.0)
Potassium: 4 mEq/L (ref 3.7–5.3)
Sodium: 141 mEq/L (ref 137–147)
TCO2: 23 mmol/L (ref 0–100)

## 2013-06-09 LAB — MAGNESIUM
Magnesium: 2.6 mg/dL — ABNORMAL HIGH (ref 1.5–2.5)
Magnesium: 2.4 mg/dL (ref 1.5–2.5)

## 2013-06-09 LAB — GLUCOSE, CAPILLARY
Glucose-Capillary: 103 mg/dL — ABNORMAL HIGH (ref 70–99)
Glucose-Capillary: 103 mg/dL — ABNORMAL HIGH (ref 70–99)
Glucose-Capillary: 104 mg/dL — ABNORMAL HIGH (ref 70–99)
Glucose-Capillary: 105 mg/dL — ABNORMAL HIGH (ref 70–99)
Glucose-Capillary: 106 mg/dL — ABNORMAL HIGH (ref 70–99)
Glucose-Capillary: 108 mg/dL — ABNORMAL HIGH (ref 70–99)
Glucose-Capillary: 110 mg/dL — ABNORMAL HIGH (ref 70–99)
Glucose-Capillary: 112 mg/dL — ABNORMAL HIGH (ref 70–99)
Glucose-Capillary: 113 mg/dL — ABNORMAL HIGH (ref 70–99)
Glucose-Capillary: 116 mg/dL — ABNORMAL HIGH (ref 70–99)
Glucose-Capillary: 121 mg/dL — ABNORMAL HIGH (ref 70–99)
Glucose-Capillary: 123 mg/dL — ABNORMAL HIGH (ref 70–99)
Glucose-Capillary: 126 mg/dL — ABNORMAL HIGH (ref 70–99)
Glucose-Capillary: 127 mg/dL — ABNORMAL HIGH (ref 70–99)
Glucose-Capillary: 128 mg/dL — ABNORMAL HIGH (ref 70–99)
Glucose-Capillary: 129 mg/dL — ABNORMAL HIGH (ref 70–99)
Glucose-Capillary: 130 mg/dL — ABNORMAL HIGH (ref 70–99)
Glucose-Capillary: 131 mg/dL — ABNORMAL HIGH (ref 70–99)
Glucose-Capillary: 140 mg/dL — ABNORMAL HIGH (ref 70–99)
Glucose-Capillary: 142 mg/dL — ABNORMAL HIGH (ref 70–99)
Glucose-Capillary: 156 mg/dL — ABNORMAL HIGH (ref 70–99)
Glucose-Capillary: 200 mg/dL — ABNORMAL HIGH (ref 70–99)
Glucose-Capillary: 222 mg/dL — ABNORMAL HIGH (ref 70–99)
Glucose-Capillary: 83 mg/dL (ref 70–99)
Glucose-Capillary: 94 mg/dL (ref 70–99)
Glucose-Capillary: 98 mg/dL (ref 70–99)

## 2013-06-09 LAB — BASIC METABOLIC PANEL
BUN: 10 mg/dL (ref 6–23)
CHLORIDE: 109 meq/L (ref 96–112)
CO2: 23 meq/L (ref 19–32)
Calcium: 8.1 mg/dL — ABNORMAL LOW (ref 8.4–10.5)
Creatinine, Ser: 0.47 mg/dL — ABNORMAL LOW (ref 0.50–1.10)
GFR calc Af Amer: >90 mL/min (ref >90)
GFR calc non Af Amer: >90 mL/min (ref >90)
GLUCOSE: 113 mg/dL — AB (ref 70–99)
POTASSIUM: 4 meq/L (ref 3.7–5.3)
Sodium: 145 mEq/L (ref 137–147)

## 2013-06-09 LAB — CREATININE, SERUM
Creatinine, Ser: 0.59 mg/dL (ref 0.50–1.10)
GFR calc Af Amer: >90 mL/min (ref >90)
GFR calc non Af Amer: >90 mL/min (ref >90)

## 2013-06-09 LAB — CBC
HEMATOCRIT: 30.5 % — AB (ref 36.0–46.0)
HEMATOCRIT: 27.5 % — AB (ref 36.0–46.0)
Hemoglobin: 10 g/dL — ABNORMAL LOW (ref 12.0–15.0)
Hemoglobin: 9.2 g/dL — ABNORMAL LOW (ref 12.0–15.0)
MCH: 30.3 pg (ref 26.0–34.0)
MCH: 31 pg (ref 26.0–34.0)
MCHC: 32.8 g/dL (ref 30.0–36.0)
MCHC: 33.5 g/dL (ref 30.0–36.0)
MCV: 92.4 fL (ref 78.0–100.0)
MCV: 92.6 fL (ref 78.0–100.0)
Platelets: 155 10*3/uL (ref 150–400)
Platelets: 140 10*3/uL — ABNORMAL LOW (ref 150–400)
RBC: 3.3 MIL/uL — AB (ref 3.87–5.11)
RBC: 2.97 MIL/uL — ABNORMAL LOW (ref 3.87–5.11)
RDW: 14.3 % (ref 11.5–15.5)
RDW: 14.2 % (ref 11.5–15.5)
WBC: 10.2 10*3/uL (ref 4.0–10.5)
WBC: 11 10*3/uL — ABNORMAL HIGH (ref 4.0–10.5)

## 2013-06-09 MED ORDER — FUROSEMIDE 10 MG/ML IJ SOLN
40.0000 mg | Freq: Once | INTRAMUSCULAR | Status: AC
  Administered 2013-06-09: 40 mg via INTRAVENOUS
  Filled 2013-06-09: qty 4

## 2013-06-09 MED ORDER — AMIODARONE HCL 200 MG PO TABS
200.0000 mg | ORAL_TABLET | Freq: Two times a day (BID) | ORAL | Status: AC
  Administered 2013-06-10 – 2013-06-16 (×14): 200 mg via ORAL
  Filled 2013-06-09 (×15): qty 1

## 2013-06-09 MED ORDER — INSULIN ASPART 100 UNIT/ML ~~LOC~~ SOLN
0.0000 [IU] | SUBCUTANEOUS | Status: DC
  Administered 2013-06-09 (×2): 2 [IU] via SUBCUTANEOUS
  Administered 2013-06-09: 8 [IU] via SUBCUTANEOUS
  Administered 2013-06-09: 4 [IU] via SUBCUTANEOUS
  Administered 2013-06-10 (×2): 2 [IU] via SUBCUTANEOUS
  Administered 2013-06-11: 8 [IU] via SUBCUTANEOUS
  Administered 2013-06-11: 4 [IU] via SUBCUTANEOUS
  Administered 2013-06-11 – 2013-06-12 (×4): 2 [IU] via SUBCUTANEOUS
  Administered 2013-06-12: 4 [IU] via SUBCUTANEOUS
  Administered 2013-06-13 – 2013-06-14 (×4): 2 [IU] via SUBCUTANEOUS
  Administered 2013-06-14: 4 [IU] via SUBCUTANEOUS
  Administered 2013-06-14: 2 [IU] via SUBCUTANEOUS

## 2013-06-09 MED ORDER — INSULIN DETEMIR 100 UNIT/ML ~~LOC~~ SOLN
15.0000 [IU] | Freq: Once | SUBCUTANEOUS | Status: AC
  Administered 2013-06-09: 15 [IU] via SUBCUTANEOUS
  Filled 2013-06-09 (×2): qty 0.15

## 2013-06-09 MED ORDER — AMIODARONE HCL 200 MG PO TABS
200.0000 mg | ORAL_TABLET | Freq: Every day | ORAL | Status: DC
  Administered 2013-06-17 – 2013-06-18 (×2): 200 mg via ORAL
  Filled 2013-06-09 (×2): qty 1

## 2013-06-09 MED ORDER — INSULIN DETEMIR 100 UNIT/ML ~~LOC~~ SOLN
15.0000 [IU] | Freq: Every day | SUBCUTANEOUS | Status: DC
  Administered 2013-06-10: 15 [IU] via SUBCUTANEOUS
  Filled 2013-06-09 (×2): qty 0.15

## 2013-06-09 MED ORDER — AMIODARONE HCL IN DEXTROSE 360-4.14 MG/200ML-% IV SOLN
60.0000 mg/h | INTRAVENOUS | Status: AC
  Administered 2013-06-09: 60 mg/h via INTRAVENOUS
  Filled 2013-06-09: qty 200

## 2013-06-09 MED ORDER — AMIODARONE HCL IN DEXTROSE 360-4.14 MG/200ML-% IV SOLN
30.0000 mg/h | INTRAVENOUS | Status: DC
  Administered 2013-06-10: 30 mg/h via INTRAVENOUS
  Filled 2013-06-09 (×11): qty 200

## 2013-06-09 MED FILL — Sodium Chloride IV Soln 0.9%: INTRAVENOUS | Qty: 2000 | Status: AC

## 2013-06-09 MED FILL — Mannitol IV Soln 20%: INTRAVENOUS | Qty: 500 | Status: AC

## 2013-06-09 MED FILL — Lidocaine HCl IV Inj 20 MG/ML: INTRAVENOUS | Qty: 5 | Status: AC

## 2013-06-09 MED FILL — Electrolyte-R (PH 7.4) Solution: INTRAVENOUS | Qty: 4000 | Status: AC

## 2013-06-09 MED FILL — Heparin Sodium (Porcine) Inj 1000 Unit/ML: INTRAMUSCULAR | Qty: 20 | Status: AC

## 2013-06-09 MED FILL — Potassium Chloride Inj 2 mEq/ML: INTRAVENOUS | Qty: 40 | Status: AC

## 2013-06-09 MED FILL — Sodium Bicarbonate IV Soln 8.4%: INTRAVENOUS | Qty: 50 | Status: AC

## 2013-06-09 MED FILL — Dexmedetomidine HCl IV Soln 200 MCG/2ML: INTRAVENOUS | Qty: 2 | Status: AC

## 2013-06-09 MED FILL — Magnesium Sulfate Inj 50%: INTRAMUSCULAR | Qty: 10 | Status: AC

## 2013-06-09 MED FILL — Heparin Sodium (Porcine) Inj 1000 Unit/ML: INTRAMUSCULAR | Qty: 30 | Status: AC

## 2013-06-09 MED FILL — Albumin, Human Inj 5%: INTRAVENOUS | Qty: 250 | Status: AC

## 2013-06-09 NOTE — Progress Notes (Signed)
TCTS BRIEF SICU PROGRESS NOTE  1 Day Post-Op  S/P Procedure(s) (LRB): CORONARY ARTERY BYPASS GRAFTING (CABG) times four using left internal mammary and right saphenous vein. (N/A) INTRAOPERATIVE TRANSESOPHAGEAL ECHOCARDIOGRAM (N/A)   Overall stable day NSR w/ frequent PAC's, BP stable O2 sats 93% on O2 via [place] UOP marginal but adequate  Plan: Continue current plan  OWEN,CLARENCE H 06/09/2013 6:26 PM

## 2013-06-09 NOTE — Progress Notes (Signed)
Inpatient Diabetes Program Recommendations  AACE/ADA: New Consensus Statement on Inpatient Glycemic Control (2013)  Target Ranges:  Prepandial:   less than 140 mg/dL      Peak postprandial:   less than 180 mg/dL (1-2 hours)      Critically ill patients:  140 - 180 mg/dL   Reason for Assessment:  Patient transitioning off insulin drip after surgery.  Diabetes history: Type 2 diabetes.  A1C=12.4%. Outpatient Diabetes medications: Byetta 10 mcg bid, Glipizide XL 10 mg bid Current orders for Inpatient glycemic control: Levemir 15 units daily and TCTS correction q 4 hours  Note that patient was receiving Lantus 35 units daily prior to surgery.  May need to increase Levemir based on fasting CBG's.  Will follow.  Based on A1C patient will likely need insulin at discharge.    Beryl Meager, RN, BC-ADM Inpatient Diabetes Coordinator Pager [phone]

## 2013-06-09 NOTE — Plan of Care (Signed)
Problem: Phase II - Intermediate Post-Op Goal: Maintain Hemodynamic Stability Outcome: Progressing Weaned milirone to 0.125. Continues dopamine. Restarted Neosynephrine.

## 2013-06-09 NOTE — Progress Notes (Signed)
  Amiodarone Drug - Drug Interaction Consult Note  Recommendations: There are currently no drug interactions with amiodarone  Amiodarone is metabolized by the cytochrome P450 system and therefore has the potential to cause many drug interactions. Amiodarone has an average plasma half-life of 50 days (range 20 to 100 days).   There is potential for drug interactions to occur several weeks or months after stopping treatment and the onset of drug interactions may be slow after initiating amiodarone.   [x]  Statins: Increased risk of myopathy. Simvastatin- restrict dose to 20mg  daily. Other statins: counsel patients to report any muscle pain or weakness immediately.  []  Anticoagulants: Amiodarone can increase anticoagulant effect. Consider warfarin dose reduction. Patients should be monitored closely and the dose of anticoagulant altered accordingly, remembering that amiodarone levels take several weeks to stabilize.  []  Antiepileptics: Amiodarone can increase plasma concentration of phenytoin, the dose should be reduced. Note that small changes in phenytoin dose can result in large changes in levels. Monitor patient and counsel on signs of toxicity.  []  Beta blockers: increased risk of bradycardia, AV block and myocardial depression. Sotalol - avoid concomitant use.  []   Calcium channel blockers (diltiazem and verapamil): increased risk of bradycardia, AV block and myocardial depression.  []   Cyclosporine: Amiodarone increases levels of cyclosporine. Reduced dose of cyclosporine is recommended.  []  Digoxin dose should be halved when amiodarone is started.  []  Diuretics: increased risk of cardiotoxicity if hypokalemia occurs.  []  Oral hypoglycemic agents (glyburide, glipizide, glimepiride): increased risk of hypoglycemia. Patient's glucose levels should be monitored closely when initiating amiodarone therapy.   []  Drugs that prolong the QT interval:  Torsades de pointes risk may be increased  with concurrent use - avoid if possible.  Monitor QTc, also keep magnesium/potassium WNL if concurrent therapy can't be avoided. Marland Kitchen Antibiotics: e.g. fluoroquinolones, erythromycin. . Antiarrhythmics: e.g. quinidine, procainamide, disopyramide, sotalol. . Antipsychotics: e.g. phenothiazines, haloperidol.  . Lithium, tricyclic antidepressants, and methadone.  Thank You,  Marcelino Scot  06/09/2013 6:08 PM

## 2013-06-09 NOTE — Progress Notes (Addendum)
NUTRITION FOLLOW UP  Intervention:    Continue with current plan of care   Follow for PO intake to assess for desire of nutritional supplement  Nutrition Dx:   Inadequate oral intake related to decreased appetite as evidenced by PO: 10%, ongoing  Goal:   Patient to meet >/= 90% of estimated nutrition needs, unmet  Monitor:   PO intake, labs, weight trends, I/Os  Assessment:   48 y.o. female with history of CAD, status post stents 2004, type II DM, former smoker, presented to the ED with complaints of constipation and difficulty breathing. She gives approximately 2 weeks history of generally feeling unwell, abdominal bloating and constipation.  Patient last seen by RD on 2/19. At this time patient reported a poor appetite due to constipation. Patient declined supplements at this time as she reported her appetite was improving. Patient received diabetes and low sodium diet education on 2/20.  Procedure 2/24:  CABG  Patient reports that she was eating better prior to CABG on 2/24. Per flow sheet records, PO intake has been variable (25-100%) since 2/19. Patient reported that PO intake at breakfast on 2/25 was poor, <50%, due to nausea. Patient is currently on clear liquid diet. Patient declined supplements at this time as she reports nausea is improving and she expects being able to eat well. RD team will continue to monitor nutrition care plan.   Potassium low at 3.4. Magnesium elevated at 3.1  Height: Ht Readings from Last 1 Encounters:  06/04/13 5' 1.5" (1.562 m)    Weight Status:   Wt Readings from Last 1 Encounters:  06/09/13 152 lb 1.9 oz (69 kg)  admission weight 2/19:  150 lb (68.4 kg)  Re-estimated needs:  Kcal: 1700-1900 Protein: 80-90 grams Fluid: 1.7-1.9 L  Skin: closed incision on chest and right leg  Diet Order: Clear Liquid   Intake/Output Summary (Last 24 hours) at 06/09/13 0858 Last data filed at 06/09/13 0800  Gross per 24 hour  Intake 6506.1 ml   Output   5975 ml  Net  531.1 ml    Last BM: 2/23   Labs:   Recent Labs Lab 06/06/13 0518 06/08/13 0250  06/08/13 1353 06/08/13 1930 06/08/13 2030 06/09/13 0500  NA 136* 139  < > 143  --  144 145  K 4.0 3.9  < > 3.4*  --  4.1 4.0  CL 96 101  --   --   --  107 109  CO2 30 27  --   --   --   --  23  BUN 24* 15  --   --   --  8 10  CREATININE 0.64 0.54  --   --  0.51 0.50 0.47*  CALCIUM 10.1 9.2  --   --   --   --  8.1*  MG  --   --   --   --  3.1*  --  2.6*  GLUCOSE 239* 191*  < > 140*  --  124* 113*  < > = values in this interval not displayed.  CBG (last 3)   Recent Labs  06/07/13 2142 06/08/13 0519 06/09/13 0801  GLUCAP 248* 180* 129*    Scheduled Meds: . acetaminophen  1,000 mg Oral 4 times per day   Or  . acetaminophen (TYLENOL) oral liquid 160 mg/5 mL  1,000 mg Per Tube 4 times per day  . aspirin EC  325 mg Oral Daily   Or  . aspirin  324 mg  Per Tube Daily  . atorvastatin  80 mg Oral q1800  . bisacodyl  10 mg Oral Daily   Or  . bisacodyl  10 mg Rectal Daily  . cefUROXime (ZINACEF)  IV  1.5 g Intravenous Q12H  . docusate sodium  200 mg Oral Daily  . famotidine (PEPCID) IV  20 mg Intravenous Q12H  . furosemide  40 mg Intravenous Once  . insulin aspart  0-24 Units Subcutaneous 6 times per day  . insulin detemir  15 Units Subcutaneous Once  . [START ON 06/10/2013] insulin detemir  15 Units Subcutaneous Daily  . insulin regular  0-10 Units Intravenous TID WC  . levalbuterol  0.63 mg Nebulization Q6H  . metoCLOPramide (REGLAN) injection  10 mg Intravenous 4 times per day  . metoprolol tartrate  12.5 mg Oral BID   Or  . metoprolol tartrate  12.5 mg Per Tube BID  . [START ON 06/10/2013] pantoprazole  40 mg Oral Daily  . sodium chloride  3 mL Intravenous Q12H    Continuous Infusions: . sodium chloride 20 mL/hr at 06/09/13 0800  . sodium chloride 20 mL/hr at 06/09/13 0800  . sodium chloride    . dexmedetomidine Stopped (06/08/13 1500)  . DOPamine 3  mcg/kg/min (06/09/13 0800)  . insulin (NOVOLIN-R) infusion 1.4 Units/hr (06/09/13 0800)  . lactated ringers 20 mL/hr at 06/09/13 0800  . milrinone 0.375 mcg/kg/min (06/09/13 0800)  . nitroGLYCERIN Stopped (06/08/13 1345)  . phenylephrine (NEO-SYNEPHRINE) Adult infusion 25 mcg/min (06/09/13 0800)    Marlane MingleAshley Bower, Dietetic Intern Pager: [phone]

## 2013-06-09 NOTE — Progress Notes (Signed)
I agree with the Student-Dietitian note and made appropriate revisions.  Katie Yoshiye Kraft, RD, LDN Pager #: [phone] After-Hours Pager #: [phone]  

## 2013-06-09 NOTE — Op Note (Signed)
NAMLaurence Ferrari:  Winemiller, Aldena                ACCOUNT NO.:  [id number]  MEDICAL RECORD NO.:  [id number]  LOCATION:  2S09C                        FACILITY:  [facility]  PHYSICIAN:  Sheliah PlaneEdward Himani Corona, MD    DATE OF BIRTH:  Feb 19, 1966  DATE OF PROCEDURE:  06/08/2013                              OPERATIVE REPORT   PREOPERATIVE DIAGNOSES:  Severe three-vessel coronary artery disease with recurrent obstruction of the previous stent and severe ischemic cardiomyopathy with ejection fraction of 20% to 25%.  POSTOPERATIVE DIAGNOSES:  Severe three-vessel coronary artery disease with recurrent obstruction of the previous stent and severe ischemic cardiomyopathy with ejection fraction of 20% to 25%.  SURGICAL PROCEDURE:  Coronary artery bypass grafting x4 with the left internal mammary to the left anterior descending coronary artery, sequential reverse saphenous vein graft to the first obtuse marginal and distal circumflex, reverse saphenous vein graft to the distal right coronary artery with right thigh and leg endo vein harvesting and placement of right femoral arterial line.  SURGEON: Delight OvensEdward B Melonie Germani MD  FIRST ASSISTANT:  Nicanor Bakehaney Collins, PA.  BRIEF HISTORY:  The patient is a is a 48 year old female with severe poorly controlled diabetes, hyperlipidemia with LDLs greater than 170 and hemoglobin A1c of 12.  She had known coronary occlusive disease, having stents placed approximately 10 years prior.  The patient presented with acute congestive heart failure.  Echocardiogram revealed 20% to 25% ejection fraction with primarily anterior apical hypokinesis. She was diuresed, stabilized medically, underwent cardiac catheterization, which revealed total occlusion of her previous placed LAD stent and progression of disease both in the right and circumflex vessels up to 70-80%, including at least 60% left main obstruction. Coronary artery bypass grafting was recommended to the patient, who agreed and signed  informed consent.  Because of the diabetes, it was decided not to use bilateral mammaries.  Radial artery was considered however with her extreme small size and in addition, the attempts at radial artery catheterization on the right side, left was unsuccessful because of the small caliber of the vessel.  DESCRIPTION OF PROCEDURE:  With Swan-Ganz and arterial line monitors in place, the patient underwent general endotracheal anesthesia.  A TEE probe was placed by Dr. Krista BlueSinger.  Findings are dictated under separate note, but confirmed preoperative evaluation of severe LV dysfunction with anterior apical hypokinesis.  There was no significant valvular disease.  There was a question of a small patent foramen, however, this was not definite.  A right femoral arterial line was placed sterilely to have access to the femoral artery, showed intraaortic balloon pump to be necessary.  The skin of the chest and legs were prepped with Betadine and draped in usual sterile manner.  Appropriate timeout was performed. Using the [employer] endo vein harvesting system, segment of vein was harvested from the right thigh and calf.  Median sternotomy was performed.  Left internal mammary artery was dissected down as pedicle graft.  The vessel loop was small; however, it had excellent flow.  The patient did have moderately elevated PA pressures.  Pericardium was opened.  There was a moderate pericardial effusion, that was nonbloody. The patient was systemically heparinized.  Ascending aorta was cannulated.  The right  atrium was cannulated and aortic root and cardioplegia needle was introduced into the ascending aorta.  The patient was placed on cardiopulmonary bypass at 2.4 liters/minute/meter square.  Sites of anastomosis were selected and dissected out of epicardium.  The patient's body temperature cooled to 32 degrees. Aortic crossclamp was applied and 500 mL of cold blood cardioplegia was administered with  diastolic arrest of the heart.  Myocardial septal temperature was monitored throughout the crossclamp period.  Attention was turned first to the distal right coronary artery, which was diffusely diseased relatively small vessel, but did admit a 1.5 mm probe.  Using a running 7-0 Prolene, distal anastomosis was performed with second reverse saphenous vein graft.  The heart was then elevated. The first obtuse marginal vessel which was 1.2 mm in size was opened. Using a running 8-0 Prolene, side-to-side anastomosis was created with a second reverse saphenous vein graft.  Distal extent of the same vein was then carried to the slightly larger distal circumflex vessel which was approximately 1.3-1.4 mm in size, also diffusely diseased and a thin- walled vessel requiring anastomosis.  Additional blood cardioplegia was administered down the vein graft.  Attention was then turned to the LAD. In the mid portion of the LAD, the vessel was opened and was also diffusely diseased and admitted a 1 mm probe distally.  Using a running 8-0 Prolene, left internal mammary artery was anastomosed to left anterior descending coronary artery.  With release of the bulldog on the mammary artery, there was rise in myocardial septal temperature and prompt erythema across the anterior wall.  Bulldog was placed back on the mammary artery.  Additional cold blood cardioplegia was administered with crossclamp still in place.  Two punch aortotomies were performed and each of the two vein grafts were anastomosed to the ascending aorta. Prior to completion of the proximal anastomosis, the bulldog was removed from the mammary artery.  There was prompt rise in myocardial septal temperature.  The heart was allowed to passively fill and de-air and the aortic cross-clamp was removed with total cross-clamp time of 94 minutes.  The patient spontaneously converted to a sinus rhythm.  Sites of anastomosis were inspected free of  bleeding.  The patient was then ventilated and weaned from cardiopulmonary bypass on milrinone and dopamine infusion.  She remained hemodynamically stable and was separated from cardiopulmonary bypass.  She was decannulated in usual fashion.  Protamine sulfate was administered.  Atrial and ventricular pacing wires were applied.  Graft markers were applied.  With operative field hemostatic, a left pleural tube and a Blake mediastinal drain were left in place.  Sternum was closed #6 stainless steel wire.  Fascia closed with interrupted 0 Vicryl, running 3-0 Vicryl subcutaneous tissue, 4-0 subcuticular stitch in skin edges.  Dry dressings were applied.  Sponge and needle count was reported as correct at completion of procedure.  The patient tolerated the procedure without obvious complication and was transferred to the Surgical Intensive Care Unit for further postoperative care.     Sheliah Plane, MD     EG/MEDQ  D:  06/09/2013  T:  06/09/2013  Job:  [id number]

## 2013-06-09 NOTE — Progress Notes (Signed)
Patient ID: Isabel [name], female   DOB: 03/17/1966, 48 y.o.   MRN: [id number] TCTS DAILY ICU PROGRESS NOTE                   [address]            [place] [address]          [phone]   1 Day Post-Op Procedure(s) (LRB): CORONARY ARTERY BYPASS GRAFTING (CABG) times four using left internal mammary and right saphenous vein. (N/A) INTRAOPERATIVE TRANSESOPHAGEAL ECHOCARDIOGRAM (N/A)  Total Length of Stay:  LOS: 7 days   Subjective: Awake and neuro intact, up to chair today  Objective: Vital signs in last 24 hours: Temp:  [97.7 F (36.5 C)-101.1 F (38.4 C)] 98.4 F (36.9 C) (02/25 1544) Pulse Rate:  [52-118] 113 (02/25 1700) Cardiac Rhythm:  [-] Sinus tachycardia (02/25 1530) Resp:  [8-27] 11 (02/25 1700) BP: (81-110)/(46-54) 81/46 mmHg (02/25 1700) SpO2:  [90 %-99 %] 93 % (02/25 1700) Weight:  [152 lb 1.9 oz (69 kg)] 152 lb 1.9 oz (69 kg) (02/25 0500)  Filed Weights   06/07/13 0518 06/08/13 0413 06/09/13 0500  Weight: 139 lb 8 oz (63.277 kg) 139 lb 1.8 oz (63.1 kg) 152 lb 1.9 oz (69 kg)    Weight change: 13 lb 0.1 oz (5.9 kg)   Hemodynamic parameters for last 24 hours: PAP: (21-42)/(10-32) 28/15 mmHg CO:  [4.8 L/min-5.4 L/min] 5 L/min CI:  [2.9 L/min/m2-3.3 L/min/m2] 3 L/min/m2  Intake/Output from previous day: 02/24 0701 - 02/25 0700 In: 6359.8 [I.V.:4254.8; Blood:535; NG/GT:70; IV Piggyback:1500] Out: 5785 [Urine:3710; Emesis/NG output:50; Blood:1625; Chest Tube:400]  Intake/Output this shift: Total I/O In: 904.6 [P.O.:120; I.V.:734.6; IV Piggyback:50] Out: 680 [Urine:520; Chest Tube:160]  Current Meds: Scheduled Meds: . acetaminophen  1,000 mg Oral 4 times per day   Or  . acetaminophen (TYLENOL) oral liquid 160 mg/5 mL  1,000 mg Per Tube 4 times per day  . [START ON 06/10/2013] amiodarone  200 mg Oral Q12H   Followed by  . [START ON 06/17/2013] amiodarone  200 mg Oral Daily  . aspirin EC  325 mg Oral Daily   Or  . aspirin  324 mg Per  Tube Daily  . atorvastatin  80 mg Oral q1800  . bisacodyl  10 mg Oral Daily   Or  . bisacodyl  10 mg Rectal Daily  . cefUROXime (ZINACEF)  IV  1.5 g Intravenous Q12H  . docusate sodium  200 mg Oral Daily  . insulin aspart  0-24 Units Subcutaneous 6 times per day  . [START ON 06/10/2013] insulin detemir  15 Units Subcutaneous Daily  . levalbuterol  0.63 mg Nebulization Q6H  . metoprolol tartrate  12.5 mg Oral BID   Or  . metoprolol tartrate  12.5 mg Per Tube BID  . [START ON 06/10/2013] pantoprazole  40 mg Oral Daily  . sodium chloride  3 mL Intravenous Q12H   Continuous Infusions: . sodium chloride 20 mL/hr at 06/09/13 1400  . sodium chloride 20 mL/hr at 06/09/13 1400  . sodium chloride    . amiodarone (NEXTERONE PREMIX) 360 mg/200 mL dextrose     Followed by  . [START ON 06/10/2013] amiodarone (NEXTERONE PREMIX) 360 mg/200 mL dextrose    . dexmedetomidine Stopped (06/08/13 1500)  . DOPamine 3 mcg/kg/min (06/09/13 1400)  . lactated ringers 20 mL/hr at 06/09/13 1400  . milrinone 0.375 mcg/kg/min (06/09/13 1400)  . nitroGLYCERIN Stopped (06/08/13 1345)  . phenylephrine (NEO-SYNEPHRINE) Adult  infusion 5 mcg/min (06/09/13 1535)   PRN Meds:.metoprolol, midazolam, morphine injection, ondansetron (ZOFRAN) IV, oxyCODONE, sodium chloride  General appearance: alert and cooperative Neurologic: intact Heart: regular rate and rhythm, S1, S2 normal, no murmur, click, rub or gallop Lungs: diminished breath sounds LLL Abdomen: soft, non-tender; bowel sounds normal; no masses,  no organomegaly Extremities: extremities normal, atraumatic, no cyanosis or edema and Homans sign is negative, no sign of DVT Wound: sternum stable  Lab Results: CBC: Recent Labs  06/09/13 0500 06/09/13 1700 06/09/13 1712  WBC 10.2 11.0*  --   HGB 10.0* 9.2* 9.2*  HCT 30.5* 27.5* 27.0*  PLT 155 140*  --    BMET:  Recent Labs  06/08/13 0250  06/09/13 0500 06/09/13 1700 06/09/13 1712  NA 139  < > 145  --   141  K 3.9  < > 4.0  --  4.0  CL 101  < > 109  --  106  CO2 27  --  23  --   --   GLUCOSE 191*  < > 113*  --  205*  BUN 15  < > 10  --  13  CREATININE 0.54  < > 0.47* 0.59 0.70  CALCIUM 9.2  --  8.1*  --   --   < > = values in this interval not displayed.  PT/INR:  Recent Labs  06/08/13 1300  LABPROT 15.7*  INR 1.28   Radiology: Dg Chest Portable 1 View In Am  06/09/2013   CLINICAL DATA:  CABG.  EXAM: PORTABLE CHEST - 1 VIEW  COMPARISON:  DG CHEST 1V PORT dated 06/08/2013  FINDINGS: Interim extubation and removal of NG tube. Swan-Ganz catheter noted with its tip in the pulmonary outflow tract. Mediastinal drains catheter and chest tube noted in stable position. Prior CABG. Cardiomegaly. Bilateral pulmonary infiltrates are present right side greater than left. Pulmonary edema could present in this fashion. Bilateral pleural effusions, right side greater left noted. No pneumothorax.  IMPRESSION: 1. Interim extubation and removal of NG tube. Swan-Ganz catheter, mediastinal drainage catheter, and left chest tube in stable position . 2. Prior CABG. Cardiomegaly bilateral pulmonary alveolar infiltrates and bilateral pleural effusions present consistent with congestive heart failure. Changes of congestive heart failure, progressed from prior study.   Electronically Signed   By: Maisie Fushomas  Register   On: 06/09/2013 08:11   Dg Chest Portable 1 View  06/08/2013   CLINICAL DATA:  Status post open heart surgery  EXAM: PORTABLE CHEST - 1 VIEW  COMPARISON:  DG ABD ACUTE W/CHEST dated 06/02/2013  FINDINGS: The lungs are reasonably well inflated. Interstitial markings are mildly prominent. The cardiopericardial silhouette is top-normal in size. There is no significant pleural effusion. There are 7 intact sternal wires present. Two coronary artery marker are present.  The endotracheal tube tip lies approximately 3 cm above the crotch of the carina. The Swan-Ganz type catheter placed via the right internal jugular  Cordis sheath has its tip in the region of the proximal right main pulmonary artery. The esophagogastric tube proximal port lies just below the expected location of the GE junction. The chest tube on the left has its tip projecting adjacent to the lateral aspect of the second rib. A mediastinal drain is present to the left of the mid thoracic spine with its tip terminating at the level of the clavicular heads. There is a radiodense linear structure that projects in the region of the medial costophrenic gutter on the left which may reflect an additional  mediastinal drain.  IMPRESSION: There is mild pulmonary interstitial prominence bilaterally with top-normal cardiac size which may reflect the patient's volume status and or low grade CHF. The support tubes and lines appear to be in reasonable position. Advancement of the nasogastric tube by 5 cm would assure that the proximal port remains below the GE junction.  These results were called by telephone at the time of interpretation on 06/08/2013 at 2:02 PM to Milly Jakob, RN,, who verbally acknowledged these results.   Electronically Signed   By: David  [place]   On: 06/08/2013 14:03     Assessment/Plan: S/P Procedure(s) (LRB): CORONARY ARTERY BYPASS GRAFTING (CABG) times four using left internal mammary and right saphenous vein. (N/A) INTRAOPERATIVE TRANSESOPHAGEAL ECHOCARDIOGRAM (N/A) Diuresis Diabetes control wean down milinone frequent pac during day, start cordarone Expected Acute  Blood - loss Anemia     Anaya Bovee B 06/09/2013 5:59 PM

## 2013-06-10 ENCOUNTER — Inpatient Hospital Stay (HOSPITAL_COMMUNITY): Payer: BC Managed Care – PPO

## 2013-06-10 LAB — BASIC METABOLIC PANEL
BUN: 18 mg/dL (ref 6–23)
CO2: 24 mEq/L (ref 19–32)
Calcium: 8.5 mg/dL (ref 8.4–10.5)
Chloride: 106 mEq/L (ref 96–112)
Creatinine, Ser: 0.56 mg/dL (ref 0.50–1.10)
GFR calc Af Amer: >90 mL/min (ref >90)
GFR calc non Af Amer: >90 mL/min (ref >90)
Glucose, Bld: 114 mg/dL — ABNORMAL HIGH (ref 70–99)
Potassium: 4 mEq/L (ref 3.7–5.3)
Sodium: 141 mEq/L (ref 137–147)

## 2013-06-10 LAB — GLUCOSE, CAPILLARY
Glucose-Capillary: 102 mg/dL — ABNORMAL HIGH (ref 70–99)
Glucose-Capillary: 112 mg/dL — ABNORMAL HIGH (ref 70–99)
Glucose-Capillary: 132 mg/dL — ABNORMAL HIGH (ref 70–99)
Glucose-Capillary: 144 mg/dL — ABNORMAL HIGH (ref 70–99)
Glucose-Capillary: 60 mg/dL — ABNORMAL LOW (ref 70–99)
Glucose-Capillary: 88 mg/dL (ref 70–99)

## 2013-06-10 LAB — MAGNESIUM: Magnesium: 2.5 mg/dL (ref 1.5–2.5)

## 2013-06-10 LAB — CBC
HCT: 28.1 % — ABNORMAL LOW (ref 36.0–46.0)
Hemoglobin: 9.2 g/dL — ABNORMAL LOW (ref 12.0–15.0)
MCH: 30.4 pg (ref 26.0–34.0)
MCHC: 32.7 g/dL (ref 30.0–36.0)
MCV: 92.7 fL (ref 78.0–100.0)
Platelets: 142 10*3/uL — ABNORMAL LOW (ref 150–400)
RBC: 3.03 MIL/uL — ABNORMAL LOW (ref 3.87–5.11)
RDW: 14.4 % (ref 11.5–15.5)
WBC: 11.3 10*3/uL — ABNORMAL HIGH (ref 4.0–10.5)

## 2013-06-10 LAB — TSH: TSH: 0.241 u[IU]/mL — ABNORMAL LOW (ref 0.350–4.500)

## 2013-06-10 MED ORDER — LEVALBUTEROL HCL 0.63 MG/3ML IN NEBU
0.6300 mg | INHALATION_SOLUTION | Freq: Three times a day (TID) | RESPIRATORY_TRACT | Status: DC
  Administered 2013-06-10 – 2013-06-16 (×19): 0.63 mg via RESPIRATORY_TRACT
  Filled 2013-06-10 (×31): qty 3

## 2013-06-10 MED ORDER — ENOXAPARIN SODIUM 30 MG/0.3ML ~~LOC~~ SOLN
30.0000 mg | SUBCUTANEOUS | Status: DC
  Administered 2013-06-10 – 2013-06-18 (×9): 30 mg via SUBCUTANEOUS
  Filled 2013-06-10 (×10): qty 0.3

## 2013-06-10 MED ORDER — METOCLOPRAMIDE HCL 5 MG/ML IJ SOLN
10.0000 mg | Freq: Three times a day (TID) | INTRAMUSCULAR | Status: AC
  Administered 2013-06-10 (×3): 10 mg via INTRAVENOUS
  Filled 2013-06-10 (×3): qty 2

## 2013-06-10 MED ORDER — FUROSEMIDE 10 MG/ML IJ SOLN
40.0000 mg | Freq: Once | INTRAMUSCULAR | Status: AC
  Administered 2013-06-10: 40 mg via INTRAVENOUS
  Filled 2013-06-10: qty 4

## 2013-06-10 NOTE — Progress Notes (Signed)
Patient ID: Isabel [name], female   DOB: Aug 18, 1965, 48 y.o.   MRN: [id number] TCTS DAILY ICU PROGRESS NOTE                   [address]            [place] [address]          [phone]   2 Days Post-Op Procedure(s) (LRB): CORONARY ARTERY BYPASS GRAFTING (CABG) times four using left internal mammary and right saphenous vein. (N/A) INTRAOPERATIVE TRANSESOPHAGEAL ECHOCARDIOGRAM (N/A)  Total Length of Stay:  LOS: 8 days   Subjective: Up to chair walked some, nausea hypoactive bowel sounds  Objective: Vital signs in last 24 hours: Temp:  [97.7 F (36.5 C)-98.8 F (37.1 C)] 98.5 F (36.9 C) (02/26 0731) Pulse Rate:  [52-118] 98 (02/26 0700) Cardiac Rhythm:  [-] Normal sinus rhythm (02/26 0400) Resp:  [8-25] 21 (02/26 0700) BP: (74-110)/(39-59) 98/59 mmHg (02/26 0700) SpO2:  [90 %-98 %] 98 % (02/26 0757) Weight:  [151 lb 14.4 oz (68.9 kg)] 151 lb 14.4 oz (68.9 kg) (02/26 0500)  Filed Weights   06/08/13 0413 06/09/13 0500 06/10/13 0500  Weight: 139 lb 1.8 oz (63.1 kg) 152 lb 1.9 oz (69 kg) 151 lb 14.4 oz (68.9 kg)    Weight change: -3.5 oz (-0.1 kg)   Hemodynamic parameters for last 24 hours: PAP: (21-30)/(12-18) 28/15 mmHg  Intake/Output from previous day: 02/25 0701 - 02/26 0700 In: 1650.1 [P.O.:120; I.V.:1430.1; IV Piggyback:100] Out: 1235 [Urine:925; Chest Tube:310]  Intake/Output this shift: Total I/O In: -  Out: 25 [Urine:25]  Current Meds: Scheduled Meds: . acetaminophen  1,000 mg Oral 4 times per day   Or  . acetaminophen (TYLENOL) oral liquid 160 mg/5 mL  1,000 mg Per Tube 4 times per day  . amiodarone  200 mg Oral Q12H   Followed by  . [START ON 06/17/2013] amiodarone  200 mg Oral Daily  . aspirin EC  325 mg Oral Daily   Or  . aspirin  324 mg Per Tube Daily  . atorvastatin  80 mg Oral q1800  . bisacodyl  10 mg Oral Daily   Or  . bisacodyl  10 mg Rectal Daily  . cefUROXime (ZINACEF)  IV  1.5 g Intravenous Q12H  . docusate sodium   200 mg Oral Daily  . insulin aspart  0-24 Units Subcutaneous 6 times per day  . insulin detemir  15 Units Subcutaneous Daily  . levalbuterol  0.63 mg Nebulization TID  . metoprolol tartrate  12.5 mg Oral BID   Or  . metoprolol tartrate  12.5 mg Per Tube BID  . pantoprazole  40 mg Oral Daily  . sodium chloride  3 mL Intravenous Q12H   Continuous Infusions: . sodium chloride 20 mL/hr at 06/09/13 1400  . sodium chloride 20 mL/hr at 06/09/13 1400  . sodium chloride    . amiodarone (NEXTERONE PREMIX) 360 mg/200 mL dextrose 30 mg/hr (06/10/13 0700)  . dexmedetomidine Stopped (06/08/13 1500)  . DOPamine 3 mcg/kg/min (06/09/13 1400)  . lactated ringers 20 mL/hr at 06/09/13 1400  . milrinone 0.125 mcg/kg/min (06/10/13 0000)  . nitroGLYCERIN Stopped (06/08/13 1345)  . phenylephrine (NEO-SYNEPHRINE) Adult infusion Stopped (06/10/13 0400)   PRN Meds:.metoprolol, midazolam, morphine injection, ondansetron (ZOFRAN) IV, oxyCODONE, sodium chloride  General appearance: alert and cooperative Neurologic: intact Heart: regular rate and rhythm, S1, S2 normal, no murmur, click, rub or gallop Lungs: diminished breath sounds bibasilar Abdomen: soft, non-tender; bowel  sounds normal; no masses,  no organomegaly and huypoactive bowel sounds Extremities: extremities normal, atraumatic, no cyanosis or edema and Homans sign is negative, no sign of DVT Wound: sternum stable  Lab Results: CBC: Recent Labs  06/09/13 1700 06/09/13 1712 06/10/13 0505  WBC 11.0*  --  11.3*  HGB 9.2* 9.2* 9.2*  HCT 27.5* 27.0* 28.1*  PLT 140*  --  142*   BMET:  Recent Labs  06/09/13 0500  06/09/13 1712 06/10/13 0505  NA 145  --  141 141  K 4.0  --  4.0 4.0  CL 109  --  106 106  CO2 23  --   --  24  GLUCOSE 113*  --  205* 114*  BUN 10  --  13 18  CREATININE 0.47*  < > 0.70 0.56  CALCIUM 8.1*  --   --  8.5  < > = values in this interval not displayed.  PT/INR:  Recent Labs  06/08/13 1300  LABPROT 15.7*  INR  1.28   Radiology: Dg Chest Port 1 View  06/10/2013   CLINICAL DATA:  Postop check CABG.  Chest tube.  EXAM: PORTABLE CHEST - 1 VIEW  COMPARISON:  06/09/2013  FINDINGS: Left-sided chest tube is unchanged. Right IJ central venous sheath remains in place with tip over the SVC as the Swan-Ganz catheter has been removed. Interval removal of mediastinal drain. No pneumothorax. Suggestion of a stent along the left heart border. Lungs are hypoinflated with prominent patchy bilateral perihilar opacification slightly worse. Persistent left base opacification with slight improved aeration. Improved aeration in the right base. Cardiomediastinal silhouette and remainder of the exam is unchanged. Opacification  IMPRESSION: Improved aeration within the lung bases with slight worsening of bilateral patchy perihilar opacification suggesting worsening interstitial edema versus infection.  Tubes and lines as described.   Electronically Signed   By: Elberta Fortis M.D.   On: 06/10/2013 08:15   Dg Chest Portable 1 View In Am  06/09/2013   CLINICAL DATA:  CABG.  EXAM: PORTABLE CHEST - 1 VIEW  COMPARISON:  DG CHEST 1V PORT dated 06/08/2013  FINDINGS: Interim extubation and removal of NG tube. Swan-Ganz catheter noted with its tip in the pulmonary outflow tract. Mediastinal drains catheter and chest tube noted in stable position. Prior CABG. Cardiomegaly. Bilateral pulmonary infiltrates are present right side greater than left. Pulmonary edema could present in this fashion. Bilateral pleural effusions, right side greater left noted. No pneumothorax.  IMPRESSION: 1. Interim extubation and removal of NG tube. Swan-Ganz catheter, mediastinal drainage catheter, and left chest tube in stable position . 2. Prior CABG. Cardiomegaly bilateral pulmonary alveolar infiltrates and bilateral pleural effusions present consistent with congestive heart failure. Changes of congestive heart failure, progressed from prior study.   Electronically Signed    By: Maisie Fus  Register   On: 06/09/2013 08:11   Dg Chest Portable 1 View  06/08/2013   CLINICAL DATA:  Status post open heart surgery  EXAM: PORTABLE CHEST - 1 VIEW  COMPARISON:  DG ABD ACUTE W/CHEST dated 06/02/2013  FINDINGS: The lungs are reasonably well inflated. Interstitial markings are mildly prominent. The cardiopericardial silhouette is top-normal in size. There is no significant pleural effusion. There are 7 intact sternal wires present. Two coronary artery marker are present.  The endotracheal tube tip lies approximately 3 cm above the crotch of the carina. The Swan-Ganz type catheter placed via the right internal jugular Cordis sheath has its tip in the region of the proximal right main  pulmonary artery. The esophagogastric tube proximal port lies just below the expected location of the GE junction. The chest tube on the left has its tip projecting adjacent to the lateral aspect of the second rib. A mediastinal drain is present to the left of the mid thoracic spine with its tip terminating at the level of the clavicular heads. There is a radiodense linear structure that projects in the region of the medial costophrenic gutter on the left which may reflect an additional mediastinal drain.  IMPRESSION: There is mild pulmonary interstitial prominence bilaterally with top-normal cardiac size which may reflect the patient's volume status and or low grade CHF. The support tubes and lines appear to be in reasonable position. Advancement of the nasogastric tube by 5 cm would assure that the proximal port remains below the GE junction.  These results were called by telephone at the time of interpretation on 06/08/2013 at 2:02 PM to Milly JakobNikki Potter, RN,, who verbally acknowledged these results.   Electronically Signed   By: David  [place]   On: 06/08/2013 14:03     Assessment/Plan: S/P Procedure(s) (LRB): CORONARY ARTERY BYPASS GRAFTING (CABG) times four using left internal mammary and right saphenous vein.  (N/A) INTRAOPERATIVE TRANSESOPHAGEAL ECHOCARDIOGRAM (N/A) Mobilize Diuresis Diabetes control d/c tubes/lines On amino for pacs, now sinus without pacs Start reglan Wean off milrinone then dopamine  Divante Kotch B 06/10/2013 8:21 AM

## 2013-06-10 NOTE — Progress Notes (Signed)
SUBJECTIVE:  "I feel rough."  No acute SOB   PHYSICAL EXAM Filed Vitals:   06/10/13 0600 06/10/13 0700 06/10/13 0731 06/10/13 0757  BP: 84/53 98/59    Pulse: 96 98    Temp:   98.5 F (36.9 C)   TempSrc:   Oral   Resp: 13 21    Height:      Weight:      SpO2: 93% 94%  98%   General:  No distress Lungs:  Decreased breath sounds Heart:  RRR, no rub Abdomen:  Decreased bowel sounds Extremities:  Mild edema   LABS:  Results for orders placed during the hospital encounter of 06/02/13 (from the past 24 hour(s))  GLUCOSE, CAPILLARY     Status: Abnormal   Collection Time    06/09/13  9:05 AM      Result Value Ref Range   Glucose-Capillary 142 (*) 70 - 99 mg/dL  GLUCOSE, CAPILLARY     Status: Abnormal   Collection Time    06/09/13 10:13 AM      Result Value Ref Range   Glucose-Capillary 123 (*) 70 - 99 mg/dL  GLUCOSE, CAPILLARY     Status: Abnormal   Collection Time    06/09/13 11:08 AM      Result Value Ref Range   Glucose-Capillary 128 (*) 70 - 99 mg/dL  GLUCOSE, CAPILLARY     Status: Abnormal   Collection Time    06/09/13 11:55 AM      Result Value Ref Range   Glucose-Capillary 126 (*) 70 - 99 mg/dL  GLUCOSE, CAPILLARY     Status: Abnormal   Collection Time    06/09/13 12:59 PM      Result Value Ref Range   Glucose-Capillary 131 (*) 70 - 99 mg/dL  GLUCOSE, CAPILLARY     Status: Abnormal   Collection Time    06/09/13  2:11 PM      Result Value Ref Range   Glucose-Capillary 140 (*) 70 - 99 mg/dL  GLUCOSE, CAPILLARY     Status: Abnormal   Collection Time    06/09/13  3:43 PM      Result Value Ref Range   Glucose-Capillary 156 (*) 70 - 99 mg/dL  MAGNESIUM     Status: None   Collection Time    06/09/13  5:00 PM      Result Value Ref Range   Magnesium 2.4  1.5 - 2.5 mg/dL  CBC     Status: Abnormal   Collection Time    06/09/13  5:00 PM      Result Value Ref Range   WBC 11.0 (*) 4.0 - 10.5 K/uL   RBC 2.97 (*) 3.87 - 5.11 MIL/uL   Hemoglobin 9.2 (*) 12.0 -  15.0 g/dL   HCT 09.3 (*) 26.7 - 12.4 %   MCV 92.6  78.0 - 100.0 fL   MCH 31.0  26.0 - 34.0 pg   MCHC 33.5  30.0 - 36.0 g/dL   RDW 58.0  99.8 - 33.8 %   Platelets 140 (*) 150 - 400 K/uL  CREATININE, SERUM     Status: None   Collection Time    06/09/13  5:00 PM      Result Value Ref Range   Creatinine, Ser 0.59  0.50 - 1.10 mg/dL   GFR calc non Af Amer >90  >90 mL/min   GFR calc Af Amer >90  >90 mL/min  POCT I-STAT, CHEM 8     Status:  Abnormal   Collection Time    06/09/13  5:12 PM      Result Value Ref Range   Sodium 141  137 - 147 mEq/L   Potassium 4.0  3.7 - 5.3 mEq/L   Chloride 106  96 - 112 mEq/L   BUN 13  6 - 23 mg/dL   Creatinine, Ser 4.09  0.50 - 1.10 mg/dL   Glucose, Bld 811 (*) 70 - 99 mg/dL   Calcium, Ion 9.14  7.82 - 1.23 mmol/L   TCO2 23  0 - 100 mmol/L   Hemoglobin 9.2 (*) 12.0 - 15.0 g/dL   HCT 95.6 (*) 21.3 - 08.6 %  GLUCOSE, CAPILLARY     Status: Abnormal   Collection Time    06/09/13  7:21 PM      Result Value Ref Range   Glucose-Capillary 222 (*) 70 - 99 mg/dL   Comment 1 Documented in Chart     Comment 2 Notify RN    GLUCOSE, CAPILLARY     Status: Abnormal   Collection Time    06/09/13 11:42 PM      Result Value Ref Range   Glucose-Capillary 200 (*) 70 - 99 mg/dL   Comment 1 Documented in Chart     Comment 2 Notify RN    GLUCOSE, CAPILLARY     Status: Abnormal   Collection Time    06/10/13  3:30 AM      Result Value Ref Range   Glucose-Capillary 132 (*) 70 - 99 mg/dL   Comment 1 Documented in Chart     Comment 2 Notify RN    BASIC METABOLIC PANEL     Status: Abnormal   Collection Time    06/10/13  5:05 AM      Result Value Ref Range   Sodium 141  137 - 147 mEq/L   Potassium 4.0  3.7 - 5.3 mEq/L   Chloride 106  96 - 112 mEq/L   CO2 24  19 - 32 mEq/L   Glucose, Bld 114 (*) 70 - 99 mg/dL   BUN 18  6 - 23 mg/dL   Creatinine, Ser 5.78  0.50 - 1.10 mg/dL   Calcium 8.5  8.4 - 46.9 mg/dL   GFR calc non Af Amer >90  >90 mL/min   GFR calc Af Amer  >90  >90 mL/min  CBC     Status: Abnormal   Collection Time    06/10/13  5:05 AM      Result Value Ref Range   WBC 11.3 (*) 4.0 - 10.5 K/uL   RBC 3.03 (*) 3.87 - 5.11 MIL/uL   Hemoglobin 9.2 (*) 12.0 - 15.0 g/dL   HCT 62.9 (*) 52.8 - 41.3 %   MCV 92.7  78.0 - 100.0 fL   MCH 30.4  26.0 - 34.0 pg   MCHC 32.7  30.0 - 36.0 g/dL   RDW 24.4  01.0 - 27.2 %   Platelets 142 (*) 150 - 400 K/uL  MAGNESIUM     Status: None   Collection Time    06/10/13  5:05 AM      Result Value Ref Range   Magnesium 2.5  1.5 - 2.5 mg/dL  GLUCOSE, CAPILLARY     Status: None   Collection Time    06/10/13  7:29 AM      Result Value Ref Range   Glucose-Capillary 88  70 - 99 mg/dL    Intake/Output Summary (Last 24 hours) at 06/10/13 0826 Last  data filed at 06/10/13 0809  Gross per 24 hour  Intake 1483.75 ml  Output   1070 ml  Net 413.75 ml     ASSESSMENT AND PLAN:  CAD:  Status post CABG.  Slow progression.    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 Weaning milrinone today.  Probably off of dopamine after this.  No BP for ACE inhibitor at this time.  We will follow for progression and med suggestions.  [place] close cards [place].   Fayrene FearingJames [facility] 06/10/2013 8:26 AM

## 2013-06-10 NOTE — Plan of Care (Signed)
Problem: Phase I - Pre-Op Goal: Point person for discharge identified Outcome: Completed/Met Date Met:  06/10/13 son

## 2013-06-10 NOTE — Progress Notes (Signed)
Patient ID: Isabel [name], female   DOB: [birth year], 48 y.o.   MRN: [id number] EVENING ROUNDS NOTE :     [address]       [place] [address]             [phone]                 2 Days Post-Op Procedure(s) (LRB): CORONARY ARTERY BYPASS GRAFTING (CABG) times four using left internal mammary and right saphenous vein. (N/A) INTRAOPERATIVE TRANSESOPHAGEAL ECHOCARDIOGRAM (N/A)  Total Length of Stay:  LOS: 8 days  BP 96/59  Pulse 94  Temp(Src) 99.1 F (37.3 C) (Oral)  Resp 14  Ht 5' 1.5" (1.562 m)  Wt 151 lb 14.4 oz (68.9 kg)  BMI 28.24 kg/m2  SpO2 99%  .Intake/Output     02/25 0701 - 02/26 0700 02/26 0701 - 02/27 0700   P.O. 120    I.V. (mL/kg) 1430.1 (20.8) 160.8 (2.3)   Blood     NG/GT     IV Piggyback 100    Total Intake(mL/kg) 1650.1 (23.9) 160.8 (2.3)   Urine (mL/kg/hr) 925 (0.6) 350 (0.5)   Emesis/NG output     Blood     Chest Tube 310 (0.2)    Total Output 1235 350   Net +415.1 -189.2          . sodium chloride 20 mL/hr at 06/09/13 1400  . sodium chloride 20 mL/hr at 06/09/13 1400  . sodium chloride    . amiodarone (NEXTERONE PREMIX) 360 mg/200 mL dextrose 30 mg/hr (06/10/13 0700)  . dexmedetomidine Stopped (06/08/13 1500)  . DOPamine 3 mcg/kg/min (06/09/13 1400)  . lactated ringers 20 mL/hr at 06/09/13 1400  . nitroGLYCERIN Stopped (06/08/13 1345)  . phenylephrine (NEO-SYNEPHRINE) Adult infusion Stopped (06/10/13 0400)     Lab Results  Component Value Date   WBC 11.3* 06/10/2013   HGB 9.2* 06/10/2013   HCT 28.1* 06/10/2013   PLT 142* 06/10/2013   GLUCOSE 114* 06/10/2013   CHOL 278* 06/05/2013   TRIG 295* 06/05/2013   HDL 27* 06/05/2013   LDLCALC 192* 06/05/2013   ALT 16 06/03/2013   AST 12 06/03/2013   NA 141 06/10/2013   K 4.0 06/10/2013   CL 106 06/10/2013   CREATININE 0.56 06/10/2013   BUN 18 06/10/2013   CO2 24 06/10/2013   TSH 1.140 06/04/2013   INR 1.28 06/08/2013   HGBA1C 12.4* 06/02/2013   Stable, taking po better   Delight Ovens MD  Beeper [phone] Office [phone] 06/10/2013 6:12 PM

## 2013-06-11 LAB — TYPE AND SCREEN
ABO/RH(D): A NEG
Antibody Screen: NEGATIVE
UNIT DIVISION: 0
UNIT DIVISION: 0
Unit division: 0
Unit division: 0

## 2013-06-11 LAB — GLUCOSE, CAPILLARY
Glucose-Capillary: 118 mg/dL — ABNORMAL HIGH (ref 70–99)
Glucose-Capillary: 139 mg/dL — ABNORMAL HIGH (ref 70–99)
Glucose-Capillary: 168 mg/dL — ABNORMAL HIGH (ref 70–99)
Glucose-Capillary: 223 mg/dL — ABNORMAL HIGH (ref 70–99)
Glucose-Capillary: 60 mg/dL — ABNORMAL LOW (ref 70–99)
Glucose-Capillary: 70 mg/dL (ref 70–99)
Glucose-Capillary: 92 mg/dL (ref 70–99)

## 2013-06-11 LAB — BASIC METABOLIC PANEL
BUN: 19 mg/dL (ref 6–23)
CO2: 24 mEq/L (ref 19–32)
Calcium: 8.5 mg/dL (ref 8.4–10.5)
Chloride: 104 mEq/L (ref 96–112)
Creatinine, Ser: 0.47 mg/dL — ABNORMAL LOW (ref 0.50–1.10)
GFR calc Af Amer: >90 mL/min (ref >90)
GFR calc non Af Amer: >90 mL/min (ref >90)
Glucose, Bld: 87 mg/dL (ref 70–99)
Potassium: 4.2 mEq/L (ref 3.7–5.3)
Sodium: 139 mEq/L (ref 137–147)

## 2013-06-11 LAB — CBC
HCT: 29 % — ABNORMAL LOW (ref 36.0–46.0)
Hemoglobin: 9.5 g/dL — ABNORMAL LOW (ref 12.0–15.0)
MCH: 30.5 pg (ref 26.0–34.0)
MCHC: 32.8 g/dL (ref 30.0–36.0)
MCV: 93.2 fL (ref 78.0–100.0)
Platelets: 157 10*3/uL (ref 150–400)
RBC: 3.11 MIL/uL — ABNORMAL LOW (ref 3.87–5.11)
RDW: 14.3 % (ref 11.5–15.5)
WBC: 9.6 10*3/uL (ref 4.0–10.5)

## 2013-06-11 MED ORDER — INSULIN DETEMIR 100 UNIT/ML ~~LOC~~ SOLN
10.0000 [IU] | Freq: Every day | SUBCUTANEOUS | Status: DC
  Administered 2013-06-11 – 2013-06-15 (×5): 10 [IU] via SUBCUTANEOUS
  Filled 2013-06-11 (×6): qty 0.1

## 2013-06-11 MED ORDER — CARVEDILOL 3.125 MG PO TABS
3.1250 mg | ORAL_TABLET | Freq: Two times a day (BID) | ORAL | Status: DC
  Administered 2013-06-12: 3.125 mg via ORAL
  Filled 2013-06-11 (×4): qty 1

## 2013-06-11 NOTE — Progress Notes (Signed)
Hypoglycemic Event  CBG: 60  Treatment: 4 oz orange juice  Symptoms: none  Follow-up CBG: Time:0409 CBG Result:92  Possible Reasons for Event: low po intake     Brock Ra  Remember to initiate Hypoglycemia Order Set & complete

## 2013-06-11 NOTE — Progress Notes (Signed)
Patient ID: Isabel [name], female   DOB: Jul 28, 1965, 48 y.o.   MRN: [id number]  SICU Evening Rounds:  Hemodynamically stable on dop 1 mcg  Urine output ok  Up in chair, eating some.

## 2013-06-11 NOTE — Discharge Summary (Signed)
[address]       [place] [id number]             [phone]       Isabel GaskinsSusan J [name] 10-May-1965 48 y.o. [id number]  06/02/2013   Delight OvensEdward B Gerhardt, MD  CHF (congestive heart failure) [428.0] HTN (hypertension) [401.9] Anemia [285.9] Hyperglycemia [790.29] Acute CHF [428.0] DM (diabetes mellitus), type 2, uncontrolled [250.02] Constipation [564.00]   HPI: Isabel [name] is a 48 y.o. female with history of CAD, status post stents 2004, type II DM, former smoker, presented to the ED at [facility] with complaints of constipation and difficulty breathing. She gives approximately 2 weeks history of generally feeling unwell, abdominal bloating and constipation. She tried oral Dulcolax without success. 4-5 days back, she had couple episodes of nonbloody emesis. She tried a rectal suppository 3 days ago without success. She saw her PCP 2 days back and was started on MiraLAX without changes. She has been passing flatus. No further emesis. Denies abdominal pain. She noticed progressively worsening orthopnea for the last 3-4 days. She used to use one pillow and now uses 4 pillows to lay down. She denies chest pain. She has chronic bilateral leg edema which has not changed. She has minimal dry cough. She feels hot and cold but no fevers or chills. In the ED, sodium 135, glucose 393, hemoglobin 11.9, troponin less than .30 pBNP 3307 , chest x-ray suggestive of congestive heart failure and abdominal x-ray shows unremarkable bowel gas pattern and hepatomegaly. She was given a dose of IV Lasix for CHF-states that has not made much difference. Hospitalist admission requested.    Review of Systems: All systems reviewed and apart from history of presenting illness, are negative.    Past Medical History   Diagnosis  Date   .  Coronary artery disease    .  Diabetes mellitus without complication    .  Renal disorder      kidney stones    Past Surgical History   Procedure   Laterality  Date   .  Cardiac stents     .  Back surgery     .  Kidney stone surgery     .  Fallopian tube and 1 ovary removed       Social History: reports that she has quit smoking. She does not have any smokeless tobacco history on file. She reports that she drinks alcohol. She reports that she does not use illicit drugs.  Married. Independent of activities of daily living.   No Known Allergies   Family History   Problem  Relation  Age of Onset   .  Heart disease  Mother    .  Cancer  Father    .  Heart disease  Father    .  Heart disease  Brother      Prior to Admission medications   Medication  Sig  Start Date  End Date  Taking?  Authorizing Provider   ALPRAZolam Prudy Feeler(XANAX) 1 MG tablet  Take 1 mg by mouth 4 (four) times daily as needed.  05/06/13   Yes  Historical Provider, MD   aspirin EC 81 MG tablet  Take 81 mg by mouth daily.    Yes  Historical Provider, MD   BYETTA 10 MCG PEN 10 MCG/0.04ML SOPN injection  Inject 10 mcg into the skin 2 (two) times daily.  04/12/13   Yes  Historical Provider, MD   GLIPIZIDE  XL 10 MG 24 hr tablet  Take 10 mg by mouth 2 (two) times daily.  04/12/13   Yes  Historical Provider, MD   lisinopril-hydrochlorothiazide (PRINZIDE,ZESTORETIC) 10-12.5 MG per tablet  Take 1 tablet by mouth daily.  04/12/13   Yes  Historical Provider, MD   metoprolol (LOPRESSOR) 50 MG tablet  Take 50 mg by mouth 2 (two) times daily.    Yes  Historical Provider, MD      Hospital Course:   The patient was admitted and placed on intravenous Lasix twice a day. Additionally she was given further treatment of her constipation. Echocardiogram revealed significant decrease in her ejection fraction to the 25-30% range were previously been normal in 2004. Diabetes management was continued and it was noted that her control is poor. Hemoglobin A1c is 12.4. Cardiology consultation was obtained and it was felt she should be transferred to [facility] for further evaluation and  treatment to include cardiac catheterization. She did improve clinically with diuresis in regards to her systolic congestive heart failure. On 06/07/2013 she was taken to the cath lab and the following results were noted:  NAME: MERCER [name] MRN: [id number]  DOB: June 18, 1965 ADMIT DATE: 06/02/2013  Procedure Date: 06/07/2013  INTERVENTIONAL CARDIOLOGIST: Marykay Lex, M.D., MS  PRIMARY CARE PROVIDER: [name], MD  PRIMARY CARDIOLOGIST: Antoine Poche, M.D.  PATIENT: Isabel [name] is a 48 y.o. female with a history of coronary disease status post PCI to the LAD in 2004 as well as type 2 diabetes and history of smoking along with obesity who has had several weeks of worsening exertional dyspnea and constipation.  She describes 3-4 day history of orthopnea and chronic LE edema. She reports over the last few weeks DOE walking room to room at home. Denies any chest pain. She has a prior cardiac history including CAD with prior stents, prior cath 2004 with LM patent, LAD ostail 30% with patent stents x 2, LCX 30%, RCA dominant with diffuse 30-40% disease, LVEF 55% at that time.  Echo this admission showed LVEF 25-30% with multiple WMAs, restrictive diastolic dysfunction. CXR with pulm edema. Trop neg x1, pro-BNP 3300. EKG sinus rhythm, LAE, anteroseptal Q-waves.  Based on the wall motion abnormalities and reduced ejection fraction, she is referred for right and left heart catheterization.  PRE-OPERATIVE DIAGNOSIS:  New diagnosis of Cardiomyopathy, presumed Ischemic  Known CAD status post PCI to LAD  PROCEDURES PERFORMED:  Left and Right Heart Catheterization with Coronary Angiography PROCEDURE:Consent: Risks of procedure as well as the alternatives and risks of each were explained to the (patient/caregiver). Consent for procedure obtained.  Consent for signed by MD and patient with RN witness -- placed on chart.  PROCEDURE: The patient was brought to the 2nd Floor [facility] in the fasting state and prepped and draped in the usual sterile fashion for Right groin or radial access. A modified Allen's test with plethysmography was performed, revealing excellent Ulnar artery collateral flow. Sterile technique was used including antiseptics, cap, gloves, gown, hand hygiene, mask and sheet. Skin prep: Chlorhexidine.  Time Out: Verified patient identification, verified procedure, site/side was marked, verified correct patient position, special equipment/implants available, medications/allergies/relevent history reviewed, required imaging and test results available. Performed  Access: After attempts are made at Right Brachial Vein and Right Radial Artery were successful, the plan was then to convert to femoral approach.  Venous: Right Common Femoral: 7 Fr sheath; modified Seldinger technique  Arterial: Right Common Femoral Artery;  5 Fr Sheath -- fluoroscopically guided modified Seldinger technique  Right Heart Catheterization:  7 Fr Swan Ganz Catheter was advanced through the sheath, and with the balloon inflated once in the SVC, was advanced under fluoroscopic guidance into the first the Right Atrium, then through the Right Ventricle into the Main Pulmonary Artery and into the Wedge position. Hemodynamics measurements were obtained in each location.  Simultaneous Oxygen saturation were recorded in both the Pulmonary Artery and the Central Aorta.  Simultaneous pressure measurements were obtained in the PA/PCWP and RV along with LV.  Thermodilution injections were performed to calculate Cardiac Output.  The catheter was then removed with the out of body over wire the sheath was flushed with heparinized saline.. Diagnostic Left Heart Catheterization And Coronary Angiography: 5 Fr Angled Pigtail, JL 4, JR 4 cath was advanced exchange over J-wire.  LV Hemodynamics (LV Gram): Angled pigtail Left Coronary Artery Angiography: JL4  Right Coronary Artery Angiography:  There are 4 Sheaths: To be removed in brachial area with manual pressure held for hemostasis.  Hemodynamic Findings:   SaO2%  Pressure mmHg  Mean P mmHg  EDP mmHg   Right Atrium   10/8   7   Right Ventricle   30/4   8   Pulmonary Artery  61  34/13  23    PCWP   20/17  16    Central Aortic  91  100/61  78    Left Ventricle   100/9   19          Cardiac Output:   Cardiac Index:    Fick  4.14   2.52    Thermodilution  3.09   1.88    Left Ventriculography:  EF: 20th and 25 %  Wall Motion: Severe global hypokinesis with worse in the mid to distal anterior wall as well as distal inferior/apical wall Coronary Anatomy:  Left Main: Large caliber vessel that bifurcates into the LAD, and circumflex. Mild calcified There is distal Left Main involvement and the ostial LAD lesion with a roughly 40-50% stenosis. LAD: Based on the near ostialstent size, originally a large caliber vessel with ostial and proximal 90-95% stenosis in the entire stent. At the distal end the stent there is again subtotal occlusion with TIMI 1 flow distally in the LAD appears to be a small-caliber vessel distally.  no diagonal vessels were visible Left Circumflex: Moderate caliber vessel that gives rise to a very proximal OM branch. There is ostial involvement with roughly 50-60% stenosis involving the ostium of the Left Circumflex. The circumflex then bifurcates into OM2and the review of circumflex terminates as a small posterolateral branch. Diffuse mild irregularities throughout the  OM1: Small to moderate caliber vessel with ostial 90% stenosis  OM 2: Moderate caliber vessel and large effusion. Diffusely mild disease. RCA: Moderate caliber, dominant vessel it is diffusely diseased. There is a early mid 60-70% stenosis followed by a 78% stenosis just before the crux. The vessel bifurcates distally into the Right Posterior Descending Artery (RPDA) and the Right Posterior AV Groove Branch (RPAV).  RPDA: Moderate caliber vessel  with diffuse luminal or delirious.  RPL Sysytem:The RPAV begins a moderate caliber vessel gives rise to 2 major posterolateral branches. The distal RPL 2 has competitive flow After reviewing the initial angiography, the patient has multivessel disease and better suited for bypass surgery.  MEDICATIONS:  Anesthesia: Local Lidocaine 18 ml Sedation: 1 mg IV Versed, 50 mcg IV fentanyl ;  Omnipaque Contrast: 70 ml PATIENT DISPOSITION:  The patient was transferred to the PACU holding area in a hemodynamicaly stable, chest pain free condition.  The patient tolerated the procedure well, and there were no complications. EBL: < 20 ml  The patient was stable before, during, and after the procedure. POST-OPERATIVE DIAGNOSIS:  Severe multivessel disease with subtotal occlusion of the LAD stent that has ostial involvement including the distal left main and ostial left circumflex. There is additional moderate to severe disease in the RCA  Severely reduced LV function with LAD and RCA distribution wall motion abnormalities.  Severely reduced Cardiac Output & Index.  Relatively adequate diuresis based on Right Heart Cath Pressures and LVEDP  PLAN OF CARE:  Transfer to CCU for post cardiac catheterization care. CT surgery has been counsulted  Will restart IV heparin  She will need aggressive medical care including diabetic management. Marykay Lex, M.D., M.S.  [facility]  [address]  [place], [place] [address]  [phone]   Cardiothoracic surgical consultation was obtained with Sheliah Plane M.D. who evaluated the patient and her studies and agreed with recommendations to proceed with coronary artery bypass grafting. She continued to be medically stabilized and on 06/08/2013 she was taken the operating room at which time she underwent the following procedure:  DATE OF PROCEDURE: 06/08/2013  OPERATIVE REPORT  PREOPERATIVE DIAGNOSES: Severe three-vessel  coronary artery disease  with recurrent obstruction of the previous stent and severe ischemic  cardiomyopathy with ejection fraction of 20% to 25%.  POSTOPERATIVE DIAGNOSES: Severe three-vessel coronary artery disease  with recurrent obstruction of the previous stent and severe ischemic  cardiomyopathy with ejection fraction of 20% to 25%.  SURGICAL PROCEDURE: Coronary artery bypass grafting x4 with the left  internal mammary to the left anterior descending coronary artery,  sequential reverse saphenous vein graft to the first obtuse marginal and  distal circumflex, reverse saphenous vein graft to the distal right  coronary artery with right thigh and leg endo vein harvesting and  placement of right femoral arterial line.  SURGEON: Delight Ovens MD  FIRST ASSISTANT: Coral Ceo, PA. The patient tolerated the procedure without obvious  complication and was transferred to the Surgical Intensive Care Unit for  further postoperative care.   Postoperative hospital course: Overall the patient is progressing nicely. She was weaned from the ventilator without difficulty. She has remained neurologically intact. She did have frequent PACs on telemetry  was started on Cordarone. She initially required some inotropic support but these were discontinued by a routine weaning. Her blood sugars have been labile, and she has required the addition of Levemir to her home medication regimen.  She does have an expected acute blood loss anemia which has stabilized. She has some postoperative volume overload which is responding to diuretics. She had constipation postoperatively requiring multiple laxatives, routine stool softeners and daily Miralax.  She has started cardiac rehabilitation and ambulation and activity tolerance is improving. Currently she is felt to be tentatively stable for discharge home.     Recent Labs  06/10/13 0505 06/11/13 0405  NA 141 139  K 4.0 4.2  CL 106 104  CO2 24 24    GLUCOSE 114* 87  BUN 18 19  CALCIUM 8.5 8.5    Recent Labs  06/10/13 0505 06/11/13 0405  WBC 11.3* 9.6  HGB 9.2* 9.5*  HCT 28.1* 29.0*  PLT 142* 157    Recent Labs  06/08/13 1300  INR 1.28     Discharge Instructions:  The patient  is discharged to home with extensive instructions on wound care and progressive ambulation.  They are instructed not to drive or perform any heavy lifting until returning to see the physician in his office.     Discharge Diagnosis:  CHF (congestive heart failure) [428.0] HTN (hypertension) [401.9] Anemia [285.9] Hyperglycemia [790.29] Acute CHF [428.0] DM (diabetes mellitus), type 2, uncontrolled [250.02] Constipation [564.00]  Secondary Diagnosis: Patient Active Problem List   Diagnosis Date Noted  . 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 Mild aortic stenosis 06/05/2013  . Acute systolic CHF (congestive heart failure) 06/02/2013  . DM (diabetes mellitus), type 2, uncontrolled 06/02/2013  . CAD (coronary artery disease), status post stents 2004 06/02/2013  . Anemia 06/02/2013  . Constipation 06/02/2013   Past Medical History  Diagnosis Date  . Coronary artery disease   . Diabetes mellitus without complication   . Renal disorder     kidney stones  . CHF (congestive heart failure)      Medications at discharge:   Medication List    STOP taking these medications       lisinopril-hydrochlorothiazide 10-12.5 MG per tablet  Commonly known as:  PRINZIDE,ZESTORETIC     metoprolol 50 MG tablet  Commonly known as:  LOPRESSOR      TAKE these medications       ALPRAZolam 1 MG tablet  Commonly known as:  XANAX  Take 1 mg by mouth 4 (four) times daily as needed.     amiodarone 200 MG tablet  Commonly known as:  PACERONE  Take 1 tablet (200 mg total) by mouth daily.     aspirin EC 325 MG tablet  Take 1 tablet (325 mg total) by mouth daily.     atorvastatin 80 MG tablet  Commonly known as:  LIPITOR  Take 1 tablet (80 mg total) by  mouth daily at 6 PM.     BYETTA 10 MCG PEN 10 MCG/0.04ML Sopn injection  Generic drug:  exenatide  Inject 10 mcg into the skin 2 (two) times daily.     carvedilol 6.25 MG tablet  Commonly known as:  COREG  Take 1 tablet (6.25 mg total) by mouth 2 (two) times daily with a meal.     furosemide 40 MG tablet  Commonly known as:  LASIX  Take 1 tablet (40 mg total) by mouth daily.     GLIPIZIDE XL 10 MG 24 hr tablet  Generic drug:  glipiZIDE  Take 10 mg by mouth 2 (two) times daily.     insulin detemir 100 UNIT/ML injection  Commonly known as:  LEVEMIR  Inject 0.15 mLs (15 Units total) into the skin daily.     lisinopril 5 MG tablet  Commonly known as:  PRINIVIL,ZESTRIL  Take 1 tablet (5 mg total) by mouth daily.     polyethylene glycol packet  Commonly known as:  MIRALAX / GLYCOLAX  Take 17 g by mouth daily.     Potassium Chloride ER 20 MEQ Tbcr  Take 20 mEq by mouth daily.     traMADol 50 MG tablet  Commonly known as:  ULTRAM  Take 1-2 tablets (50-100 mg total) by mouth every 4 (four) hours as needed for moderate pain.          The patient has been discharged on:   1.Beta Blocker:  Yes [  x ]  No   [   ]                              If No, reason:  2.Ace Inhibitor/ARB: Yes [ x  ]                                     No  [   ]                                     If No, reason:   3.Statin:   Yes [ x  ]                  No  [   ]                  If No, reason:  4.Ecasa:  Yes  [ x  ]                  No   [   ]                  If No, reason:     Follow Up: Follow-up Information   Follow up with GERHARDT,EDWARD B, MD. (07/01/2013 at 9:30 AM to see the surgeon. Please obtain a chest x-ray at [facility] imaging 1 hour prior to the appointment. [facility] is located in the same office complex.)    Specialty:  Cardiothoracic Surgery   Contact information:   [address] [place] [address] [phone]         Follow up with Laqueta Linden, MD. (06/29/13 at 8:40 am)    Specialty:  Cardiology   Contact information:   [address] [place] [address] [phone]       Schedule an appointment as soon as possible for a visit with [name], MD. (For followup of diabetes)    Specialty:  Family Medicine   Contact information:   [address] [place] [address] [phone]        Gershon Crane, PA-C 06/11/2013  12:05 PM

## 2013-06-11 NOTE — Progress Notes (Signed)
Hypoglycemic Event  CBG:60  Treatment:4 oz orange juice  Symptoms: none  Follow-up CBG: Time:0005 CBG Result:70  Possible Reasons for Event: low po intake    Brock Ra  Remember to initiate Hypoglycemia Order Set & complete

## 2013-06-11 NOTE — Plan of Care (Signed)
Problem: Phase II - Intermediate Post-Op Goal: Activity Progressed Outcome: Progressing Up to chair x 3 and ambulated 100 ft x 1 and 300 ft x 1.

## 2013-06-11 NOTE — Plan of Care (Signed)
Problem: Phase II Progression Outcomes Goal: Fluid volume status improved Outcome: Progressing Able to wean everything off except for dopamine.

## 2013-06-11 NOTE — Progress Notes (Addendum)
Patient ID: Isabel [name], female   DOB: 29-Sep-1965, 48 y.o.   MRN: [id number] [facility] DAILY ICU PROGRESS NOTE                   [address]            [place] [id number]          [phone]   3 Days Post-Op Procedure(s) (LRB): CORONARY ARTERY BYPASS GRAFTING (CABG) times four using left internal mammary and right saphenous vein. (N/A) INTRAOPERATIVE TRANSESOPHAGEAL ECHOCARDIOGRAM (N/A)  Total Length of Stay:  LOS: 9 days   Subjective: Walked around unit today, still on dopamine for bp   Objective: Vital signs in last 24 hours: Temp:  [98 F (36.7 C)-99.1 F (37.3 C)] 98 F (36.7 C) (02/27 0755) Pulse Rate:  [91-109] 99 (02/27 0900) Cardiac Rhythm:  [-] Normal sinus rhythm (02/27 0815) Resp:  [10-27] 20 (02/27 0900) BP: (85-116)/(49-70) 96/65 mmHg (02/27 0900) SpO2:  [87 %-100 %] 97 % (02/27 0900) Weight:  [152 lb 12.5 oz (69.3 kg)] 152 lb 12.5 oz (69.3 kg) (02/27 0500)  Filed Weights   06/09/13 0500 06/10/13 0500 06/11/13 0500  Weight: 152 lb 1.9 oz (69 kg) 151 lb 14.4 oz (68.9 kg) 152 lb 12.5 oz (69.3 kg)    Weight change: 14.1 oz (0.4 kg)   Hemodynamic parameters for last 24 hours:    Intake/Output from previous day: 02/26 0701 - 02/27 0700 In: 1187.3 [P.O.:600; I.V.:587.3] Out: 850 [Urine:850]  Intake/Output this shift: Total I/O In: 47 [I.V.:47] Out: -   Current Meds: Scheduled Meds: . acetaminophen  1,000 mg Oral 4 times per day   Or  . acetaminophen (TYLENOL) oral liquid 160 mg/5 mL  1,000 mg Per Tube 4 times per day  . amiodarone  200 mg Oral Q12H   Followed by  . [START ON 06/17/2013] amiodarone  200 mg Oral Daily  . aspirin EC  325 mg Oral Daily   Or  . aspirin  324 mg Per Tube Daily  . atorvastatin  80 mg Oral q1800  . bisacodyl  10 mg Oral Daily   Or  . bisacodyl  10 mg Rectal Daily  . docusate sodium  200 mg Oral Daily  . enoxaparin (LOVENOX) injection  30 mg Subcutaneous Q24H  . insulin aspart  0-24 Units Subcutaneous 6  times per day  . insulin detemir  15 Units Subcutaneous Daily  . levalbuterol  0.63 mg Nebulization TID  . metoprolol tartrate  12.5 mg Oral BID   Or  . metoprolol tartrate  12.5 mg Per Tube BID  . pantoprazole  40 mg Oral Daily  . sodium chloride  3 mL Intravenous Q12H   Continuous Infusions: . sodium chloride 20 mL/hr at 06/10/13 2000  . sodium chloride 20 mL/hr at 06/09/13 1400  . sodium chloride    . amiodarone (NEXTERONE PREMIX) 360 mg/200 mL dextrose Stopped (06/10/13 1200)  . dexmedetomidine Stopped (06/08/13 1500)  . DOPamine 3 mcg/kg/min (06/11/13 0900)  . lactated ringers 20 mL/hr at 06/09/13 1400  . nitroGLYCERIN Stopped (06/08/13 1345)  . phenylephrine (NEO-SYNEPHRINE) Adult infusion Stopped (06/10/13 0400)   PRN Meds:.metoprolol, midazolam, morphine injection, ondansetron (ZOFRAN) IV, oxyCODONE, sodium chloride  General appearance: alert, cooperative and appears older than stated age Neurologic: intact Heart: regular rate and rhythm, S1, S2 normal, no murmur, click, rub or gallop Lungs: clear to auscultation bilaterally Abdomen: soft, non-tender; bowel sounds normal; no masses,  no organomegaly Extremities: extremities  normal, atraumatic, no cyanosis or edema and Homans sign is negative, no sign of DVT Wound: sternum stable  Lab Results: CBC: Recent Labs  06/10/13 0505 06/11/13 0405  WBC 11.3* 9.6  HGB 9.2* 9.5*  HCT 28.1* 29.0*  PLT 142* 157   BMET:  Recent Labs  06/10/13 0505 06/11/13 0405  NA 141 139  K 4.0 4.2  CL 106 104  CO2 24 24  GLUCOSE 114* 87  BUN 18 19  CREATININE 0.56 0.47*  CALCIUM 8.5 8.5    PT/INR:  Recent Labs  06/08/13 1300  LABPROT 15.7*  INR 1.28   Radiology: Dg Chest Port 1 View  06/10/2013   CLINICAL DATA:  Postop check CABG.  Chest tube.  EXAM: PORTABLE CHEST - 1 VIEW  COMPARISON:  06/09/2013  FINDINGS: Left-sided chest tube is unchanged. Right IJ central venous sheath remains in place with tip over the SVC as the  Swan-Ganz catheter has been removed. Interval removal of mediastinal drain. No pneumothorax. Suggestion of a stent along the left heart border. Lungs are hypoinflated with prominent patchy bilateral perihilar opacification slightly worse. Persistent left base opacification with slight improved aeration. Improved aeration in the right base. Cardiomediastinal silhouette and remainder of the exam is unchanged. Opacification  IMPRESSION: Improved aeration within the lung bases with slight worsening of bilateral patchy perihilar opacification suggesting worsening interstitial edema versus infection.  Tubes and lines as described.   Electronically Signed   By: Elberta Fortis M.D.   On: 06/10/2013 08:15     Assessment/Plan: S/P Procedure(s) (LRB): CORONARY ARTERY BYPASS GRAFTING (CABG) times four using left internal mammary and right saphenous vein. (N/A) INTRAOPERATIVE TRANSESOPHAGEAL ECHOCARDIOGRAM (N/A) Mobilize Diuresis Diabetes control H/H and cr stable Decrease long acting  insulin because of low blood sugars Wean off dopamine Poor lv function 20% but will not be able to start ace yet dure to border line BP  Isabel [name] B 06/11/2013 10:02 AM

## 2013-06-11 NOTE — Discharge Instructions (Signed)
Cardiac Diet This diet can help prevent heart disease and stroke. Many factors influence your heart health, including eating and exercise habits. Coronary risk rises a lot with abnormal blood fat (lipid) levels. Cardiac meal planning includes limiting unhealthy fats, increasing healthy fats, and making other small dietary changes. General guidelines are as follows:  Adjust calorie intake to reach and maintain desirable body weight.  Limit total fat intake to less than 30% of total calories. Saturated fat should be less than 7% of calories.  Saturated fats are found in animal products and in some vegetable products. Saturated vegetable fats are found in coconut oil, cocoa butter, palm oil, and palm kernel oil. Read labels carefully to avoid these products as much as possible. Use butter in moderation. Choose tub margarines and oils that have 2 grams of fat or less. Good cooking oils are canola and olive oils.  Practice low-fat cooking techniques. Do not fry food. Instead, broil, bake, boil, steam, grill, roast on a rack, stir-fry, or microwave it. Other fat reducing suggestions include:  Remove the skin from poultry.  Remove all visible fat from meats.  Skim the fat off stews, soups, and gravies before serving them.  Steam vegetables in water or broth instead of sauting them in fat.  Avoid foods with trans fat (or hydrogenated oils), such as commercially fried foods and commercially baked goods. Commercial shortening and deep-frying fats will contain trans fat.  Increase intake of fruits, vegetables, whole grains, and legumes to replace foods high in fat.  Increase consumption of nuts, legumes, and seeds to at least 4 servings weekly. One serving of a legume equals  cup, and 1 serving of nuts or seeds equals  cup.  Choose whole grains more often. Have 3 servings per day (a serving is 1 ounce [oz]).  Eat 4 to 5 servings of vegetables per day. A serving of vegetables is 1 cup of raw  leafy vegetables;  cup of raw or cooked cut-up vegetables;  cup of vegetable juice.  Eat 4 to 5 servings of fruit per day. A serving of fruit is 1 medium whole fruit;  cup of dried fruit;  cup of fresh, frozen, or canned fruit;  cup of 100% fruit juice.  Increase your intake of dietary fiber to 20 to 30 grams per day. Insoluble fiber may help lower your risk of heart disease and may help curb your appetite.  Soluble fiber binds cholesterol to be removed from the blood. Foods high in soluble fiber are dried beans, citrus fruits, oats, apples, bananas, broccoli, Brussels sprouts, and eggplant.  Try to include foods fortified with plant sterols or stanols, such as yogurt, breads, juices, or margarines. Choose several fortified foods to achieve a daily intake of 2 to 3 grams of plant sterols or stanols.  Foods with omega-3 fats can help reduce your risk of heart disease. Aim to have a 3.5 oz portion of fatty fish twice per week, such as salmon, mackerel, albacore tuna, sardines, lake trout, or herring. If you wish to take a fish oil supplement, choose one that contains 1 gram of both DHA and EPA.  Limit processed meats to 2 servings (3 oz portion) weekly.  Limit the sodium in your diet to 1500 milligrams (mg) per day. If you have high blood pressure, talk to a registered dietitian about a DASH (Dietary Approaches to Stop Hypertension) eating plan.  Limit sweets and beverages with added sugar, such as soda, to no more than 5 servings per week. One  serving is:   1 tablespoon sugar.  1 tablespoon jelly or jam.   cup sorbet.  1 cup lemonade.   cup regular soda. CHOOSING FOODS Starches  Allowed: Breads: All kinds (wheat, rye, raisin, white, oatmeal, [place], [place], and English muffin bread). Low-fat rolls: English muffins, frankfurter and hamburger buns, bagels, pita bread, tortillas (not fried). Pancakes, waffles, biscuits, and muffins made with recommended oil.  Avoid: Products made  with saturated or trans fats, oils, or whole milk products. Butter rolls, cheese breads, croissants. Commercial doughnuts, muffins, sweet rolls, biscuits, waffles, pancakes, store-bought mixes. Crackers  Allowed: Low-fat crackers and snacks: Animal, graham, rye, saltine (with recommended oil, no lard), oyster, and matzo crackers. Bread sticks, melba toast, rusks, flatbread, pretzels, and light popcorn.  Avoid: High-fat crackers: cheese crackers, butter crackers, and those made with coconut, palm oil, or trans fat (hydrogenated oils). Buttered popcorn. Cereals  Allowed: Hot or cold whole-grain cereals.  Avoid: Cereals containing coconut, hydrogenated vegetable fat, or animal fat. Potatoes / Pasta / Rice  Allowed: All kinds of potatoes, rice, and pasta (such as macaroni, spaghetti, and noodles).  Avoid: Pasta or rice prepared with cream sauce or high-fat cheese. Chow mein noodles, [place] fries. Vegetables  Allowed: All vegetables and vegetable juices.  Avoid: Fried vegetables. Vegetables in cream, butter, or high-fat cheese sauces. Limit coconut. Fruit in cream or custard. Protein  Allowed: Limit your intake of meat, seafood, and poultry to no more than 6 oz (cooked weight) per day. All lean, well-trimmed beef, veal, pork, and lamb. All chicken and [place] without skin. All fish and shellfish. Wild game: wild duck, rabbit, pheasant, and venison. Egg whites or low-cholesterol egg substitutes may be used as desired. Meatless dishes: recipes with dried beans, peas, lentils, and tofu (soybean curd). Seeds and nuts: all seeds and most nuts.  Avoid: Prime grade and other heavily marbled and fatty meats, such as short ribs, spare ribs, rib eye roast or steak, frankfurters, sausage, bacon, and high-fat luncheon meats, mutton. Caviar. Commercially fried fish. Domestic duck, goose, venison sausage. Organ meats: liver, gizzard, heart, chitterlings, brains, kidney, sweetbreads. Dairy  Allowed: Low-fat  cheeses: nonfat or low-fat cottage cheese (1% or 2% fat), cheeses made with part skim milk, such as mozzarella, farmers, string, or ricotta. (Cheeses should be labeled no more than 2 to 6 grams fat per oz.). Skim (or 1%) milk: liquid, powdered, or evaporated. Buttermilk made with low-fat milk. Drinks made with skim or low-fat milk or cocoa. Chocolate milk or cocoa made with skim or low-fat (1%) milk. Nonfat or low-fat yogurt.  Avoid: Whole milk cheeses, including colby, cheddar, [place], [address], [place], [place], [place], [address], [place], and blue. Creamed cottage cheese, cream cheese. Whole milk and whole milk products, including buttermilk or yogurt made from whole milk, drinks made from whole milk. Condensed milk, evaporated whole milk, and 2% milk. Soups and Combination Foods  Allowed: Low-fat low-sodium soups: broth, dehydrated soups, homemade broth, soups with the fat removed, homemade cream soups made with skim or low-fat milk. Low-fat spaghetti, lasagna, chili, and Spanish rice if low-fat ingredients and low-fat cooking techniques are used.  Avoid: Cream soups made with whole milk, cream, or high-fat cheese. All other soups. Desserts and Sweets  Allowed: Sherbet, fruit ices, gelatins, meringues, and angel food cake. Homemade desserts with recommended fats, oils, and milk products. Jam, jelly, honey, marmalade, sugars, and syrups. Pure sugar candy, such as gum drops, hard candy, jelly beans, marshmallows, mints, and small amounts of dark chocolate.  Avoid: Commercially prepared  cakes, pies, cookies, frosting, pudding, or mixes for these products. Desserts containing whole milk products, chocolate, coconut, lard, palm oil, or palm kernel oil. Ice cream or ice cream drinks. Candy that contains chocolate, coconut, butter, hydrogenated fat, or unknown ingredients. Buttered syrups. Fats and Oils  Allowed: Vegetable oils: safflower, sunflower, corn, soybean, cottonseed, sesame, canola, olive,  or peanut. Non-hydrogenated margarines. Salad dressing or mayonnaise: homemade or commercial, made with a recommended oil. Low or nonfat salad dressing or mayonnaise.  Limit added fats and oils to 6 to 8 tsp per day (includes fats used in cooking, baking, salads, and spreads on bread). Remember to count the "hidden fats" in foods.  Avoid: Solid fats and shortenings: butter, lard, salt pork, bacon drippings. Gravy containing meat fat, shortening, or suet. Cocoa butter, coconut. Coconut oil, palm oil, palm kernel oil, or hydrogenated oils: these ingredients are often used in bakery products, nondairy creamers, whipped toppings, candy, and commercially fried foods. Read labels carefully. Salad dressings made of unknown oils, sour cream, or cheese, such as blue cheese and Roquefort. Cream, all kinds: half-and-half, light, heavy, or whipping. Sour cream or cream cheese (even if "light" or low-fat). Nondairy cream substitutes: coffee creamers and sour cream substitutes made with palm, palm kernel, hydrogenated oils, or coconut oil. Beverages  Allowed: Coffee (regular or decaffeinated), tea. Diet carbonated beverages, mineral water. Alcohol: Check with your caregiver. Moderation is recommended.  Avoid: Whole milk, regular sodas, and juice drinks with added sugar. Condiments  Allowed: All seasonings and condiments. Cocoa powder. "Cream" sauces made with recommended ingredients.  Avoid: Carob powder made with hydrogenated fats. SAMPLE MENU Breakfast   cup orange juice   cup oatmeal  1 slice toast  1 tsp margarine  1 cup skim milk Lunch  [place] sandwich with 2 oz [place], 2 slices bread  Lettuce and tomato slices  Fresh fruit  Carrot sticks  Coffee or tea Snack  Fresh fruit or low-fat crackers Dinner  3 oz lean ground beef  1 baked potato  1 tsp margarine   cup asparagus  Lettuce salad  1 tbs non-creamy dressing   cup peach slices  1 cup skim milk Document Released:  01/09/2008 Document Revised: 10/01/2011 Document Reviewed: 06/25/2011 [employer] Patient Information 2014 [place], [place].  Heart Failure Heart failure is a condition in which the heart has trouble pumping blood. This means your heart does not pump blood efficiently for your body to work well. In some cases of heart failure, fluid may back up into your lungs or you may have swelling (edema) in your lower legs. Heart failure is usually a long-term (chronic) condition. It is important for you to take good care of yourself and follow your caregiver's treatment plan. CAUSES  Some health conditions can cause heart failure. Those health conditions include:  High blood pressure (hypertension) causes the heart muscle to work harder than normal. When pressure in the blood vessels is high, the heart needs to pump (contract) with more force in order to circulate blood throughout the body. High blood pressure eventually causes the heart to become stiff and weak.  Coronary artery disease (CAD) is the buildup of cholesterol and fat (plaque) in the arteries of the heart. The blockage in the arteries deprives the heart muscle of oxygen and blood. This can cause chest pain and may lead to a heart attack. High blood pressure can also contribute to CAD.  Heart attack (myocardial infarction) occurs when 1 or more arteries in the heart become blocked. The loss of  oxygen damages the muscle tissue of the heart. When this happens, part of the heart muscle dies. The injured tissue does not contract as well and weakens the heart's ability to pump blood.  Abnormal heart valves can cause heart failure when the heart valves do not open and close properly. This makes the heart muscle pump harder to keep the blood flowing.  Heart muscle disease (cardiomyopathy or myocarditis) is damage to the heart muscle from a variety of causes. These can include drug or alcohol abuse, infections, or unknown reasons. These can increase the risk of  heart failure.  Lung disease makes the heart work harder because the lungs do not work properly. This can cause a strain on the heart, leading it to fail.  Diabetes increases the risk of heart failure. High blood sugar contributes to high fat (lipid) levels in the blood. Diabetes can also cause slow damage to tiny blood vessels that carry important nutrients to the heart muscle. When the heart does not get enough oxygen and food, it can cause the heart to become weak and stiff. This leads to a heart that does not contract efficiently.  Other conditions can contribute to heart failure. These include abnormal heart rhythms, thyroid problems, and low blood counts (anemia). Certain unhealthy behaviors can increase the risk of heart failure. Those unhealthy behaviors include:  Being overweight.  Smoking or chewing tobacco.  Eating foods high in fat and cholesterol.  Abusing illicit drugs or alcohol.  Lacking physical activity. SYMPTOMS  Heart failure symptoms may vary and can be hard to detect. Symptoms may include:  Shortness of breath with activity, such as climbing stairs.  Persistent cough.  Swelling of the feet, ankles, legs, or abdomen.  Unexplained weight gain.  Difficulty breathing when lying flat (orthopnea).  Waking from sleep because of the need to sit up and get more air.  Rapid heartbeat.  Fatigue and loss of energy.  Feeling lightheaded, dizzy, or close to fainting.  Loss of appetite.  Nausea.  Increased urination during the night (nocturia). DIAGNOSIS  A diagnosis of heart failure is based on your history, symptoms, physical examination, and diagnostic tests. Diagnostic tests for heart failure may include:  Echocardiography.  Electrocardiography.  Chest X-ray.  Blood tests.  Exercise stress test.  Cardiac angiography.  Radionuclide scans. TREATMENT  Treatment is aimed at managing the symptoms of heart failure. Medicines, behavioral changes, or  surgical intervention may be necessary to treat heart failure.  Medicines to help treat heart failure may include:  Angiotensin-converting enzyme (ACE) inhibitors. This type of medicine blocks the effects of a blood protein called angiotensin-converting enzyme. ACE inhibitors relax (dilate) the blood vessels and help lower blood pressure.  Angiotensin receptor blockers. This type of medicine blocks the actions of a blood protein called angiotensin. Angiotensin receptor blockers dilate the blood vessels and help lower blood pressure.  Water pills (diuretics). Diuretics cause the kidneys to remove salt and water from the blood. The extra fluid is removed through urination. This loss of extra fluid lowers the volume of blood the heart pumps.  Beta blockers. These prevent the heart from beating too fast and improve heart muscle strength.  Digitalis. This increases the force of the heartbeat.  Healthy behavior changes include:  Obtaining and maintaining a healthy weight.  Stopping smoking or chewing tobacco.  Eating heart healthy foods.  Limiting or avoiding alcohol.  Stopping illicit drug use.  Physical activity as directed by your caregiver.  Surgical treatment for heart failure may include:  A procedure to open blocked arteries, repair damaged heart valves, or remove damaged heart muscle tissue.  A pacemaker to improve heart muscle function and control certain abnormal heart rhythms.  An internal cardioverter defibrillator to treat certain serious abnormal heart rhythms.  A left ventricular assist device to assist the pumping ability of the heart. HOME CARE INSTRUCTIONS   Take your medicine as directed by your caregiver. Medicines are important in reducing the workload of your heart, slowing the progression of heart failure, and improving your symptoms.  Do not stop taking your medicine unless directed by your caregiver.  Do not skip any dose of medicine.  Refill your  prescriptions before you run out of medicine. Your medicines are needed every day.  Take over-the-counter medicine only as directed by your caregiver or pharmacist.  Engage in moderate physical activity if directed by your caregiver. Moderate physical activity can benefit some people. The elderly and people with severe heart failure should consult with a caregiver for physical activity recommendations.  Eat heart healthy foods. Food choices should be free of trans fat and low in saturated fat, cholesterol, and salt (sodium). Healthy choices include fresh or frozen fruits and vegetables, fish, lean meats, legumes, fat-free or low-fat dairy products, and whole grain or high fiber foods. Talk to a dietitian to learn more about heart healthy foods.  Limit sodium if directed by your caregiver. Sodium restriction may reduce symptoms of heart failure in some people. Talk to a dietitian to learn more about heart healthy seasonings.  Use healthy cooking methods. Healthy cooking methods include roasting, grilling, broiling, baking, poaching, steaming, or stir-frying. Talk to a dietitian to learn more about healthy cooking methods.  Limit fluids if directed by your caregiver. Fluid restriction may reduce symptoms of heart failure in some people.  Weigh yourself every day. Daily weights are important in the early recognition of excess fluid. You should weigh yourself every morning after you urinate and before you eat breakfast. Wear the same amount of clothing each time you weigh yourself. Record your daily weight. Provide your caregiver with your weight record.  Monitor and record your blood pressure if directed by your caregiver.  Check your pulse if directed by your caregiver.  Lose weight if directed by your caregiver. Weight loss may reduce symptoms of heart failure in some people.  Stop smoking or chewing tobacco. Nicotine makes your heart work harder by causing your blood vessels to constrict. Do  not use nicotine gum or patches before talking to your caregiver.  Schedule and attend follow-up visits as directed by your caregiver. It is important to keep all your appointments.  Limit alcohol intake to no more than 1 drink per day for nonpregnant women and 2 drinks per day for men. Drinking more than that is harmful to your heart. Tell your caregiver if you drink alcohol several times a week. Talk with your caregiver about whether alcohol is safe for you. If your heart has already been damaged by alcohol or you have severe heart failure, drinking alcohol should be stopped completely.  Stop illicit drug use.  Stay up-to-date with immunizations. It is especially important to prevent respiratory infections through current pneumococcal and influenza immunizations.  Manage other health conditions such as hypertension, diabetes, thyroid disease, or abnormal heart rhythms as directed by your caregiver.  Learn to manage stress.  Plan rest periods when fatigued.  Learn strategies to manage high temperatures. If the weather is extremely hot:  Avoid vigorous physical activity.  Use  air conditioning or fans or seek a cooler location.  Avoid caffeine and alcohol.  Wear loose-fitting, lightweight, and light-colored clothing.  Learn strategies to manage cold temperatures. If the weather is extremely cold:  Avoid vigorous physical activity.  Layer clothes.  Wear mittens or gloves, a hat, and a scarf when going outside.  Avoid alcohol.  Obtain ongoing education and support as needed.  Participate or seek rehabilitation as needed to maintain or improve independence and quality of life. SEEK MEDICAL CARE IF:   Your weight increases by 03 lb/1.4 kg in 1 day or 05 lb/2.3 kg in a week.  You have increasing shortness of breath that is unusual for you.  You are unable to participate in your usual physical activities.  You tire easily.  You cough more than normal, especially with  physical activity.  You have any or more swelling in areas such as your hands, feet, ankles, or abdomen.  You are unable to sleep because it is hard to breathe.  You feel like your heart is beating fast (palpitations).  You become dizzy or lightheaded upon standing up. SEEK IMMEDIATE MEDICAL CARE IF:   You have difficulty breathing.  There is a change in mental status such as decreased alertness or difficulty with concentration.  You have a pain or discomfort in your chest.  You have an episode of fainting (syncope). MAKE SURE YOU:   Understand these instructions.  Will watch your condition.  Will get help right away if you are not doing well or get worse. Document Released: 04/01/2005 Document Revised: 07/27/2012 Document Reviewed: 04/23/2012 [facility] Patient Information 2014 [place], [place]. Endoscopic Saphenous Vein Harvesting Care After Refer to this sheet in the next few weeks. These instructions provide you with information on caring for yourself after your procedure. Your caregiver may also give you more specific instructions. Your treatment has been planned according to current medical practices, but problems sometimes occur. Call your caregiver if you have any problems or questions after your procedure. HOME CARE INSTRUCTIONS Medicine  Take whatever pain medicine your surgeon prescribes. Follow the directions carefully. Do not take over-the-counter pain medicine unless your surgeon says it is okay. Some pain medicine can cause bleeding problems for several weeks after surgery.  Follow your surgeon's instructions about driving. You will probably not be permitted to drive after heart surgery.  Take any medicines your surgeon prescribes. Any medicines you took before your heart surgery should be checked with your caregiver before you start taking them again. Wound care  Ask your surgeon how long you should keep wearing your elastic bandage or stocking.  Check the area  around your surgical cuts (incisions) whenever your bandages (dressings) are changed. Look for any redness or swelling.  You will need to return to have the stitches (sutures) or staples taken out. Ask your surgeon when to do that.  Ask your surgeon when you can shower or bathe. Activity  Try to keep your legs raised when you are sitting.  Do any exercises your caregivers have given you. These may include deep breathing exercises, coughing, walking, or other exercises. SEEK MEDICAL CARE IF:  You have any questions about your medicines.  You have more leg pain, especially if your pain medicine stops working.  New or growing bruises develop on your leg.  Your leg swells, feels tight, or becomes red.  You have numbness in your leg. SEEK IMMEDIATE MEDICAL CARE IF:  Your pain gets much worse.  Blood or fluid leaks from any  of the incisions.  Your incisions become warm, swollen, or red.  You have chest pain.  You have trouble breathing.  You have a fever.  You have more pain near your leg incision. MAKE SURE YOU:  Understand these instructions.  Will watch your condition.  Will get help right away if you are not doing well or get worse. Document Released: 12/12/2010 Document Revised: 06/24/2011 Document Reviewed: 12/12/2010 [facility] Patient Information 2014 [place], [place]. Coronary Artery Bypass Grafting, Care After Refer to this sheet in the next few weeks. These instructions provide you with information on caring for yourself after your procedure. Your health care provider may also give you more specific instructions. Your treatment has been planned according to current medical practices, but problems sometimes occur. Call your health care provider if you have any problems or questions after your procedure. WHAT TO EXPECT AFTER THE PROCEDURE Recovery from surgery will be different for everyone. Some people feel well after 3 or 4 weeks, while for others it takes longer.  After your procedure, it is typical to have the following:  Nausea and a lack of appetite.   Constipation.  Weakness and fatigue.   Depression or irritability.   Pain or discomfort at your incision site. HOME CARE INSTRUCTIONS  Only take over-the-counter or prescription medicines as directed by your health care provider. Take all medicines exactly as directed. Do not stop taking medicines or start any new medicines without first checking with your health care provider.   Take your pulse as directed by your health care provider.  Perform deep breathing as directed by your health care provider. If you were given a device called an incentive spirometer, use it to practice deep breathing several times a day. Support your chest with a pillow or your arms when you take deep breaths or cough.  Keep incision areas clean, dry, and protected. Remove or change any bandages (dressings) only as directed by your health care provider. You may have skin adhesive strips over the incision areas. Do not take the strips off. They will fall off on their own.  Check incision areas daily for any swelling, redness, or drainage.  If incisions were made in your legs, do the following:  Avoid crossing your legs.   Avoid sitting for long periods of time. Change positions every 30 minutes.   Elevate your legs when you are sitting.   Wear compression stockings as directed by your health care provider. These stockings help keep blood clots from forming in your legs.  Take showers once your health care provider approves. Until then, only take sponge baths. Pat incisions dry. Do not rub incisions with a washcloth or towel. Do not take tub baths or go swimming until your health care provider approves.  Eat foods that are high in fiber, such as raw fruits and vegetables, whole grains, beans, and nuts. Meats should be lean cut. Avoid canned, processed, and fried foods.  Drink enough fluids to keep your urine  clear or pale yellow.  Weigh yourself every day. This helps identify if you are retaining fluid that may make your heart and lungs work harder.   Rest and limit activity as directed by your health care provider. You may be instructed to:  Stop any activity at once if you have chest pain, shortness of breath, irregular heartbeats, or dizziness. Get help right away if you have any of these symptoms.  Move around frequently for short periods or take short walks as directed by your health  care provider. Increase your activities gradually. You may need physical therapy or cardiac rehabilitation to help strengthen your muscles and build your endurance.  Avoid lifting, pushing, or pulling anything heavier than 10 lb (4.5 kg) for at least 6 weeks after surgery.  Do not drive until your health care provider approves.  Ask your health care provider when you may return to work and resume sexual activity.  Follow up with your health care provider as directed.  SEEK MEDICAL CARE IF:  You have swelling, redness, increasing pain, or drainage at the site of an incision.   You develop a fever.   You have swelling in your ankles or legs.   You have pain in your legs.   You have weight gain of 2 or more pounds a day.  You are nauseous or vomit.  You have diarrhea. SEEK IMMEDIATE MEDICAL CARE IF:  You have chest pain that goes to your jaw or arms.  You have shortness of breath.   You have a fast or irregular heartbeat.   You notice a "clicking" in your breastbone (sternum) when you move.   You have numbness or weakness in your arms or legs.  You feel dizzy or lightheaded.  MAKE SURE YOU:  Understand these instructions.  Will watch your condition.  Will get help right away if you are not doing well or get worse. Document Released: 10/19/2004 Document Revised: 12/02/2012 Document Reviewed: 09/08/2012 [facility] Patient Information 2014 [place], [place].

## 2013-06-12 ENCOUNTER — Inpatient Hospital Stay (HOSPITAL_COMMUNITY): Payer: BC Managed Care – PPO

## 2013-06-12 LAB — BASIC METABOLIC PANEL
BUN: 18 mg/dL (ref 6–23)
CO2: 24 mEq/L (ref 19–32)
Calcium: 8.5 mg/dL (ref 8.4–10.5)
Chloride: 103 mEq/L (ref 96–112)
Creatinine, Ser: 0.42 mg/dL — ABNORMAL LOW (ref 0.50–1.10)
GFR calc Af Amer: >90 mL/min (ref >90)
GFR calc non Af Amer: >90 mL/min (ref >90)
Glucose, Bld: 130 mg/dL — ABNORMAL HIGH (ref 70–99)
Potassium: 4.8 mEq/L (ref 3.7–5.3)
Sodium: 138 mEq/L (ref 137–147)

## 2013-06-12 LAB — GLUCOSE, CAPILLARY
Glucose-Capillary: 123 mg/dL — ABNORMAL HIGH (ref 70–99)
Glucose-Capillary: 121 mg/dL — ABNORMAL HIGH (ref 70–99)
Glucose-Capillary: 192 mg/dL — ABNORMAL HIGH (ref 70–99)
Glucose-Capillary: 146 mg/dL — ABNORMAL HIGH (ref 70–99)
Glucose-Capillary: 113 mg/dL — ABNORMAL HIGH (ref 70–99)

## 2013-06-12 LAB — CBC
HCT: 27.9 % — ABNORMAL LOW (ref 36.0–46.0)
Hemoglobin: 9.3 g/dL — ABNORMAL LOW (ref 12.0–15.0)
MCH: 31.2 pg (ref 26.0–34.0)
MCHC: 33.3 g/dL (ref 30.0–36.0)
MCV: 93.6 fL (ref 78.0–100.0)
Platelets: 196 10*3/uL (ref 150–400)
RBC: 2.98 MIL/uL — ABNORMAL LOW (ref 3.87–5.11)
RDW: 14.3 % (ref 11.5–15.5)
WBC: 8.2 10*3/uL (ref 4.0–10.5)

## 2013-06-12 NOTE — Progress Notes (Signed)
4 Days Post-Op Procedure(s) (LRB): CORONARY ARTERY BYPASS GRAFTING (CABG) times four using left internal mammary and right saphenous vein. (N/A) INTRAOPERATIVE TRANSESOPHAGEAL ECHOCARDIOGRAM (N/A) Subjective:  Tired. Did not sleep well  Ambulated this am.  Objective: Vital signs in last 24 hours: Temp:  [97 F (36.1 C)-98.3 F (36.8 C)] 97.9 F (36.6 C) (02/28 1208) Pulse Rate:  [88-112] 90 (02/28 1200) Cardiac Rhythm:  [-] Normal sinus rhythm (02/28 1000) Resp:  [16-35] 28 (02/28 1200) BP: (84-112)/(49-68) 95/56 mmHg (02/28 1200) SpO2:  [94 %-99 %] 96 % (02/28 1200) Weight:  [70 kg (154 lb 5.2 oz)] 70 kg (154 lb 5.2 oz) (02/28 0500)  Hemodynamic parameters for last 24 hours:    Intake/Output from previous day: 02/27 0701 - 02/28 0700 In: 1251.5 [P.O.:750; I.V.:501.5] Out: 950 [Urine:950] Intake/Output this shift: Total I/O In: 400 [P.O.:300; I.V.:100] Out: 100 [Urine:100]  General appearance: alert and cooperative Heart: regular rate and rhythm, S1, S2 normal, no murmur, click, rub or gallop Lungs: crackles over right lung Extremities: extremities normal, atraumatic, no cyanosis or edema Wound: incision ok  Lab Results:  Recent Labs  06/11/13 0405 06/12/13 0406  WBC 9.6 8.2  HGB 9.5* 9.3*  HCT 29.0* 27.9*  PLT 157 196   BMET:  Recent Labs  06/11/13 0405 06/12/13 0406  NA 139 138  K 4.2 4.8  CL 104 103  CO2 24 24  GLUCOSE 87 130*  BUN 19 18  CREATININE 0.47* 0.42*  CALCIUM 8.5 8.5    PT/INR: No results found for this basename: LABPROT, INR,  in the last 72 hours ABG    Component Value Date/Time   PHART 7.382 06/08/2013 1835   HCO3 25.2* 06/08/2013 1835   TCO2 23 06/09/2013 1712   ACIDBASEDEF 3.0* 06/08/2013 1720   O2SAT 99.0 06/08/2013 1835   CBG (last 3)   Recent Labs  06/12/13 0419 06/12/13 0805 06/12/13 1206  GLUCAP 123* 121* 192*    Assessment/Plan: S/P Procedure(s) (LRB): CORONARY ARTERY BYPASS GRAFTING (CABG) times four using  left internal mammary and right saphenous vein. (N/A) INTRAOPERATIVE TRANSESOPHAGEAL ECHOCARDIOGRAM (N/A)  She is off dopamine now with SBP 95. Will hold Coreg for now. She had low EF 25-30% preop. She has asymmetric edema in right lung on CXR but weight only a couple lbs over preop. With borderline BP I will wait to diurese.  Expected acute blood loss anemia: stable  Diabetes: Glucose under fair control. Continue Levemir and SSI.  Continue mobilization.   LOS: 10 days    Evelene Croon K 06/12/2013

## 2013-06-12 NOTE — Progress Notes (Signed)
Pt called RN to room.  Pt stating her head hurt and requesting something to drink.  Pt provided with orange juice as her 2400 CBG =79.  Pt states her CBG usually runs high.  Pt refused 5mg  Oxy.  While RN at bedside pt had an episode where her O2 sat dropped into mid 70s.  Pt O2 sats eventually recovered back in 90s.  Pt placed in high fowler's position.  I/S completed only achieving 250-500.  Pt expresses no other needs at this time.  Will closely monitor.

## 2013-06-13 ENCOUNTER — Inpatient Hospital Stay (HOSPITAL_COMMUNITY): Payer: BC Managed Care – PPO

## 2013-06-13 LAB — GLUCOSE, CAPILLARY
Glucose-Capillary: 141 mg/dL — ABNORMAL HIGH (ref 70–99)
Glucose-Capillary: 116 mg/dL — ABNORMAL HIGH (ref 70–99)
Glucose-Capillary: 79 mg/dL (ref 70–99)
Glucose-Capillary: 86 mg/dL (ref 70–99)
Glucose-Capillary: 95 mg/dL (ref 70–99)
Glucose-Capillary: 160 mg/dL — ABNORMAL HIGH (ref 70–99)
Glucose-Capillary: 118 mg/dL — ABNORMAL HIGH (ref 70–99)
Glucose-Capillary: 150 mg/dL — ABNORMAL HIGH (ref 70–99)

## 2013-06-13 MED ORDER — FUROSEMIDE 10 MG/ML IJ SOLN
40.0000 mg | Freq: Once | INTRAMUSCULAR | Status: AC
  Administered 2013-06-13: 40 mg via INTRAVENOUS
  Filled 2013-06-13: qty 4

## 2013-06-13 NOTE — Progress Notes (Signed)
5 Days Post-Op Procedure(s) (LRB): CORONARY ARTERY BYPASS GRAFTING (CABG) times four using left internal mammary and right saphenous vein. (N/A) INTRAOPERATIVE TRANSESOPHAGEAL ECHOCARDIOGRAM (N/A) Subjective:  Feels better today  ambulated  Objective: Vital signs in last 24 hours: Temp:  [97.9 F (36.6 C)-98.2 F (36.8 C)] 97.9 F (36.6 C) (03/01 1203) Pulse Rate:  [82-95] 85 (03/01 1200) Cardiac Rhythm:  [-] Normal sinus rhythm (03/01 1200) Resp:  [15-30] 17 (03/01 1200) BP: (82-109)/(41-64) 97/57 mmHg (03/01 1200) SpO2:  [97 %-100 %] 100 % (03/01 1200) Weight:  [70.262 kg (154 lb 14.4 oz)] 70.262 kg (154 lb 14.4 oz) (03/01 0600)  Hemodynamic parameters for last 24 hours:    Intake/Output from previous day: 02/28 0701 - 03/01 0700 In: 1600 [P.O.:1140; I.V.:460] Out: 400 [Urine:400] Intake/Output this shift: Total I/O In: 240 [P.O.:240] Out: 275 [Urine:275]  General appearance: alert and cooperative Heart: regular rate and rhythm, S1, S2 normal, no murmur, click, rub or gallop Lungs: diminished breath sounds bibasilar Extremities: edema moderate in legs Wound: incision ok  Lab Results:  Recent Labs  06/11/13 0405 06/12/13 0406  WBC 9.6 8.2  HGB 9.5* 9.3*  HCT 29.0* 27.9*  PLT 157 196   BMET:  Recent Labs  06/11/13 0405 06/12/13 0406  NA 139 138  K 4.2 4.8  CL 104 103  CO2 24 24  GLUCOSE 87 130*  BUN 19 18  CREATININE 0.47* 0.42*  CALCIUM 8.5 8.5    PT/INR: No results found for this basename: LABPROT, INR,  in the last 72 hours ABG    Component Value Date/Time   PHART 7.382 06/08/2013 1835   HCO3 25.2* 06/08/2013 1835   TCO2 23 06/09/2013 1712   ACIDBASEDEF 3.0* 06/08/2013 1720   O2SAT 99.0 06/08/2013 1835   CBG (last 3)   Recent Labs  06/13/13 0402 06/13/13 0801 06/13/13 1201  GLUCAP 141* 95 160*   CLINICAL DATA: Post CABG.  EXAM:  PORTABLE CHEST - 1 VIEW  COMPARISON: 06/12/2013  FINDINGS:  Right IJ central venous sheath has tip over  the SVC unchanged. Lungs  are hypoinflated with mild bilateral perihilar opacification which  is improved likely improving interstitial edema. Persistent left  basilar opacification likely small effusion with atelectasis. No  evidence of pneumothorax. Cardiomediastinal silhouette and remainder  of the exam is unchanged.  IMPRESSION:  Hypoinflation with evidence of improving mild interstitial edema.  Stable left base opacification likely combination effusion and  atelectasis.  Right IJ central venous sheath unchanged.  Electronically Signed  By: Elberta Fortis M.D.  On: 06/13/2013 11:13  Assessment/Plan: S/P Procedure(s) (LRB): CORONARY ARTERY BYPASS GRAFTING (CABG) times four using left internal mammary and right saphenous vein. (N/A) INTRAOPERATIVE TRANSESOPHAGEAL ECHOCARDIOGRAM (N/A)  She is hemodynamically stable with SBP in the 95-100 range. Will start diuresis. Hold off on BB until her BP rises.  Diabetes: glucose under control  Continue IS, ambulation     LOS: 11 days    Urban Naval K 06/13/2013

## 2013-06-14 LAB — GLUCOSE, CAPILLARY
Glucose-Capillary: 132 mg/dL — ABNORMAL HIGH (ref 70–99)
Glucose-Capillary: 135 mg/dL — ABNORMAL HIGH (ref 70–99)
Glucose-Capillary: 171 mg/dL — ABNORMAL HIGH (ref 70–99)
Glucose-Capillary: 208 mg/dL — ABNORMAL HIGH (ref 70–99)
Glucose-Capillary: 134 mg/dL — ABNORMAL HIGH (ref 70–99)
Glucose-Capillary: 196 mg/dL — ABNORMAL HIGH (ref 70–99)

## 2013-06-14 LAB — BASIC METABOLIC PANEL
BUN: 20 mg/dL (ref 6–23)
CO2: 24 meq/L (ref 19–32)
CREATININE: 0.44 mg/dL — AB (ref 0.50–1.10)
Calcium: 8.9 mg/dL (ref 8.4–10.5)
Chloride: 102 mEq/L (ref 96–112)
GFR calc non Af Amer: >90 mL/min (ref >90)
Glucose, Bld: 129 mg/dL — ABNORMAL HIGH (ref 70–99)
Potassium: 4 mEq/L (ref 3.7–5.3)
Sodium: 137 mEq/L (ref 137–147)

## 2013-06-14 MED ORDER — FUROSEMIDE 20 MG PO TABS
20.0000 mg | ORAL_TABLET | Freq: Every day | ORAL | Status: DC
  Administered 2013-06-14: 20 mg via ORAL
  Filled 2013-06-14 (×2): qty 1

## 2013-06-14 MED ORDER — PANTOPRAZOLE SODIUM 40 MG PO TBEC
40.0000 mg | DELAYED_RELEASE_TABLET | Freq: Every day | ORAL | Status: DC
  Administered 2013-06-15 – 2013-06-18 (×4): 40 mg via ORAL
  Filled 2013-06-14 (×3): qty 1

## 2013-06-14 MED ORDER — OXYCODONE HCL 5 MG PO TABS
5.0000 mg | ORAL_TABLET | ORAL | Status: DC | PRN
  Administered 2013-06-14: 10 mg via ORAL
  Filled 2013-06-14: qty 1
  Filled 2013-06-14: qty 2

## 2013-06-14 MED ORDER — EXENATIDE 10 MCG/0.04ML ~~LOC~~ SOPN: 10.0000 ug | PEN_INJECTOR | Freq: Two times a day (BID) | SUBCUTANEOUS | Status: DC

## 2013-06-14 MED ORDER — BISACODYL 10 MG RE SUPP
10.0000 mg | Freq: Every day | RECTAL | Status: DC | PRN
  Filled 2013-06-14 (×2): qty 1

## 2013-06-14 MED ORDER — ONDANSETRON HCL 4 MG PO TABS: 4.0000 mg | ORAL_TABLET | Freq: Four times a day (QID) | ORAL | Status: DC | PRN

## 2013-06-14 MED ORDER — BISACODYL 5 MG PO TBEC
10.0000 mg | DELAYED_RELEASE_TABLET | Freq: Every day | ORAL | Status: DC | PRN
  Administered 2013-06-18: 10 mg via ORAL
  Filled 2013-06-14 (×2): qty 2

## 2013-06-14 MED ORDER — ONDANSETRON HCL 4 MG/2ML IJ SOLN: 4.0000 mg | Freq: Four times a day (QID) | INTRAMUSCULAR | Status: DC | PRN

## 2013-06-14 MED ORDER — MOVING RIGHT ALONG BOOK
Freq: Once | Status: AC
  Administered 2013-06-14: 09:00:00
  Filled 2013-06-14: qty 1

## 2013-06-14 MED ORDER — SODIUM CHLORIDE 0.9 % IV SOLN: 250.0000 mL | INTRAVENOUS | Status: DC | PRN

## 2013-06-14 MED ORDER — DOCUSATE SODIUM 100 MG PO CAPS
200.0000 mg | ORAL_CAPSULE | Freq: Every day | ORAL | Status: DC
  Administered 2013-06-14 – 2013-06-18 (×5): 200 mg via ORAL
  Filled 2013-06-14 (×6): qty 2

## 2013-06-14 MED ORDER — ASPIRIN EC 325 MG PO TBEC
325.0000 mg | DELAYED_RELEASE_TABLET | Freq: Every day | ORAL | Status: DC
  Administered 2013-06-14 – 2013-06-18 (×5): 325 mg via ORAL
  Filled 2013-06-14 (×5): qty 1

## 2013-06-14 MED ORDER — CARVEDILOL 3.125 MG PO TABS
3.1250 mg | ORAL_TABLET | Freq: Two times a day (BID) | ORAL | Status: DC
  Administered 2013-06-14 – 2013-06-16 (×5): 3.125 mg via ORAL
  Filled 2013-06-14 (×7): qty 1

## 2013-06-14 MED ORDER — SODIUM CHLORIDE 0.9 % IJ SOLN: 3.0000 mL | INTRAMUSCULAR | Status: DC | PRN

## 2013-06-14 MED ORDER — INSULIN ASPART 100 UNIT/ML ~~LOC~~ SOLN
0.0000 [IU] | Freq: Three times a day (TID) | SUBCUTANEOUS | Status: DC
  Administered 2013-06-14: 4 [IU] via SUBCUTANEOUS
  Administered 2013-06-14: 2 [IU] via SUBCUTANEOUS
  Administered 2013-06-14: 8 [IU] via SUBCUTANEOUS
  Administered 2013-06-15 (×3): 4 [IU] via SUBCUTANEOUS
  Administered 2013-06-15: 2 [IU] via SUBCUTANEOUS
  Administered 2013-06-16: 8 [IU] via SUBCUTANEOUS
  Administered 2013-06-16 (×2): 2 [IU] via SUBCUTANEOUS
  Administered 2013-06-17: 8 [IU] via SUBCUTANEOUS
  Administered 2013-06-17: 4 [IU] via SUBCUTANEOUS
  Administered 2013-06-17: 12 [IU] via SUBCUTANEOUS
  Administered 2013-06-18: 8 [IU] via SUBCUTANEOUS

## 2013-06-14 MED ORDER — SODIUM CHLORIDE 0.9 % IJ SOLN
3.0000 mL | Freq: Two times a day (BID) | INTRAMUSCULAR | Status: DC
  Administered 2013-06-14 – 2013-06-17 (×8): 3 mL via INTRAVENOUS
  Administered 2013-06-18: 11:00:00 via INTRAVENOUS

## 2013-06-14 MED ORDER — GLIPIZIDE ER 10 MG PO TB24
10.0000 mg | ORAL_TABLET | Freq: Two times a day (BID) | ORAL | Status: DC
  Administered 2013-06-14 – 2013-06-18 (×9): 10 mg via ORAL
  Filled 2013-06-14 (×11): qty 1

## 2013-06-14 MED ORDER — TRAMADOL HCL 50 MG PO TABS
50.0000 mg | ORAL_TABLET | ORAL | Status: DC | PRN
  Administered 2013-06-16: 50 mg via ORAL
  Administered 2013-06-18: 100 mg via ORAL
  Filled 2013-06-14: qty 2
  Filled 2013-06-14: qty 1

## 2013-06-14 MED ORDER — GUAIFENESIN ER 600 MG PO TB12
600.0000 mg | ORAL_TABLET | Freq: Two times a day (BID) | ORAL | Status: DC | PRN
  Filled 2013-06-14: qty 1

## 2013-06-14 MED ORDER — POTASSIUM CHLORIDE CRYS ER 10 MEQ PO TBCR
10.0000 meq | EXTENDED_RELEASE_TABLET | Freq: Every day | ORAL | Status: AC
  Administered 2013-06-14 – 2013-06-16 (×3): 10 meq via ORAL
  Filled 2013-06-14 (×3): qty 1

## 2013-06-14 NOTE — Progress Notes (Signed)
Patient ID: Isabel GaskinsSusan J [name], female   DOB: 11-30-1965, 48 y.o.   MRN: [id number] [facility] DAILY ICU PROGRESS NOTE                   [address]            [place] [id number]          [phone]   6 Days Post-Op Procedure(s) (LRB): CORONARY ARTERY BYPASS GRAFTING (CABG) times four using left internal mammary and right saphenous vein. (N/A) INTRAOPERATIVE TRANSESOPHAGEAL ECHOCARDIOGRAM (N/A)  Total Length of Stay:  LOS: 12 days   Subjective: Feels better today, walking double laps around the unit  Objective: Vital signs in last 24 hours: Temp:  [97.7 F (36.5 C)-98.4 F (36.9 C)] 98.4 F (36.9 C) (03/02 0735) Pulse Rate:  [80-93] 88 (03/02 0700) Cardiac Rhythm:  [-] Normal sinus rhythm (03/02 0600) Resp:  [0-34] 0 (03/02 0700) BP: (83-120)/(39-98) 100/59 mmHg (03/02 0700) SpO2:  [97 %-100 %] 97 % (03/02 0700) Weight:  [154 lb 15.7 oz (70.3 kg)] 154 lb 15.7 oz (70.3 kg) (03/02 0407)  Filed Weights   06/12/13 0500 06/13/13 0600 06/14/13 0407  Weight: 154 lb 5.2 oz (70 kg) 154 lb 14.4 oz (70.262 kg) 154 lb 15.7 oz (70.3 kg)    Weight change: 1.3 oz (0.038 kg)   Hemodynamic parameters for last 24 hours:    Intake/Output from previous day: 03/01 0701 - 03/02 0700 In: 960 [P.O.:960] Out: 1575 [Urine:1575]  Intake/Output this shift:    Current Meds: Scheduled Meds: . amiodarone  200 mg Oral Q12H   Followed by  . [START ON 06/17/2013] amiodarone  200 mg Oral Daily  . aspirin EC  325 mg Oral Daily   Or  . aspirin  324 mg Per Tube Daily  . atorvastatin  80 mg Oral q1800  . bisacodyl  10 mg Oral Daily   Or  . bisacodyl  10 mg Rectal Daily  . docusate sodium  200 mg Oral Daily  . enoxaparin (LOVENOX) injection  30 mg Subcutaneous Q24H  . insulin aspart  0-24 Units Subcutaneous 6 times per day  . insulin detemir  10 Units Subcutaneous Daily  . levalbuterol  0.63 mg Nebulization TID  . pantoprazole  40 mg Oral Daily  . sodium chloride  3 mL Intravenous  Q12H   Continuous Infusions: . sodium chloride 20 mL/hr at 06/10/13 2000  . sodium chloride 20 mL/hr at 06/09/13 1400  . sodium chloride    . amiodarone (NEXTERONE PREMIX) 360 mg/200 mL dextrose Stopped (06/10/13 1200)  . lactated ringers 20 mL/hr at 06/09/13 1400   PRN Meds:.morphine injection, ondansetron (ZOFRAN) IV, oxyCODONE, sodium chloride  General appearance: alert and cooperative Neurologic: intact Heart: regular rate and rhythm, S1, S2 normal, no murmur, click, rub or gallop Lungs: diminished breath sounds LLL Abdomen: soft, non-tender; bowel sounds normal; no masses,  no organomegaly Extremities: extremities normal, atraumatic, no cyanosis or edema and Homans sign is negative, no sign of DVT Wound: sternum stable  Lab Results: CBC: Recent Labs  06/12/13 0406  WBC 8.2  HGB 9.3*  HCT 27.9*  PLT 196   BMET:  Recent Labs  06/12/13 0406 06/14/13 0250  NA 138 137  K 4.8 4.0  CL 103 102  CO2 24 24  GLUCOSE 130* 129*  BUN 18 20  CREATININE 0.42* 0.44*  CALCIUM 8.5 8.9    PT/INR: No results found for this basename: LABPROT, INR,  in the  last 72 hours Radiology: Dg Chest Port 1 View  06/13/2013   CLINICAL DATA:  Post CABG.  EXAM: PORTABLE CHEST - 1 VIEW  COMPARISON:  06/12/2013  FINDINGS: Right IJ central venous sheath has tip over the SVC unchanged. Lungs are hypoinflated with mild bilateral perihilar opacification which is improved likely improving interstitial edema. Persistent left basilar opacification likely small effusion with atelectasis. No evidence of pneumothorax. Cardiomediastinal silhouette and remainder of the exam is unchanged.  IMPRESSION: Hypoinflation with evidence of improving mild interstitial edema. Stable left base opacification likely combination effusion and atelectasis.  Right IJ central venous sheath unchanged.   Electronically Signed   By: Elberta Fortis M.D.   On: 06/13/2013 11:13     Assessment/Plan: S/P Procedure(s) (LRB): CORONARY  ARTERY BYPASS GRAFTING (CABG) times four using left internal mammary and right saphenous vein. (N/A) INTRAOPERATIVE TRANSESOPHAGEAL ECHOCARDIOGRAM (N/A) Mobilize Diuresis Diabetes control Plan for transfer to step-down: see transfer orders Restart beta blocker low dose, bp will not allow ace yet    Isabel [name] 06/14/2013 8:04 AM

## 2013-06-15 ENCOUNTER — Inpatient Hospital Stay (HOSPITAL_COMMUNITY): Payer: BC Managed Care – PPO

## 2013-06-15 DIAGNOSIS — I2589 Other forms of chronic ischemic heart disease: Secondary | ICD-10-CM

## 2013-06-15 LAB — BASIC METABOLIC PANEL
BUN: 19 mg/dL (ref 6–23)
CO2: 26 mEq/L (ref 19–32)
Calcium: 9.4 mg/dL (ref 8.4–10.5)
Chloride: 102 mEq/L (ref 96–112)
Creatinine, Ser: 0.52 mg/dL (ref 0.50–1.10)
GFR calc Af Amer: >90 mL/min (ref >90)
GFR calc non Af Amer: >90 mL/min (ref >90)
Glucose, Bld: 177 mg/dL — ABNORMAL HIGH (ref 70–99)
Potassium: 4.5 mEq/L (ref 3.7–5.3)
Sodium: 140 mEq/L (ref 137–147)

## 2013-06-15 LAB — GLUCOSE, CAPILLARY
Glucose-Capillary: 162 mg/dL — ABNORMAL HIGH (ref 70–99)
Glucose-Capillary: 186 mg/dL — ABNORMAL HIGH (ref 70–99)
Glucose-Capillary: 190 mg/dL — ABNORMAL HIGH (ref 70–99)
Glucose-Capillary: 154 mg/dL — ABNORMAL HIGH (ref 70–99)

## 2013-06-15 LAB — CBC
HCT: 29.8 % — ABNORMAL LOW (ref 36.0–46.0)
Hemoglobin: 9.6 g/dL — ABNORMAL LOW (ref 12.0–15.0)
MCH: 30.1 pg (ref 26.0–34.0)
MCHC: 32.2 g/dL (ref 30.0–36.0)
MCV: 93.4 fL (ref 78.0–100.0)
Platelets: 402 10*3/uL — ABNORMAL HIGH (ref 150–400)
RBC: 3.19 MIL/uL — ABNORMAL LOW (ref 3.87–5.11)
RDW: 14.1 % (ref 11.5–15.5)
WBC: 6.9 10*3/uL (ref 4.0–10.5)

## 2013-06-15 MED ORDER — FUROSEMIDE 10 MG/ML IJ SOLN: 40.0000 mg | Freq: Once | INTRAMUSCULAR | Status: DC

## 2013-06-15 MED ORDER — FUROSEMIDE 40 MG PO TABS: 40.0000 mg | ORAL_TABLET | Freq: Every day | ORAL | Status: DC

## 2013-06-15 MED ORDER — FUROSEMIDE 40 MG PO TABS
40.0000 mg | ORAL_TABLET | Freq: Every day | ORAL | Status: AC
  Administered 2013-06-15 – 2013-06-16 (×2): 40 mg via ORAL
  Filled 2013-06-15 (×2): qty 1

## 2013-06-15 MED ORDER — LACTULOSE 10 GM/15ML PO SOLN
20.0000 g | Freq: Once | ORAL | Status: AC
  Administered 2013-06-15: 20 g via ORAL
  Filled 2013-06-15: qty 30

## 2013-06-15 MED ORDER — LISINOPRIL 2.5 MG PO TABS
2.5000 mg | ORAL_TABLET | Freq: Every day | ORAL | Status: DC
  Administered 2013-06-15 – 2013-06-16 (×2): 2.5 mg via ORAL
  Filled 2013-06-15 (×3): qty 1

## 2013-06-15 NOTE — Progress Notes (Signed)
CARDIAC REHAB PHASE I   PRE:  Rate/Rhythm: 89 SR  BP:  Supine:   Sitting: 102/70  Standing:    SaO2: 96 RA  MODE:  Ambulation: 890 ft   POST:  Rate/Rhythm: 98  BP:  Supine:   Sitting: 120/64  Standing:    SaO2: 92 RA 1120-1150  Assisted X 1 with hand held assist to ambulate. Gait steady. VS stable Pt able to walk 890 feet. Her only c/o was of legs feeling tight from the swelling. Pt back to recliner after walk with call light in reach.  Melina Copa RN 06/15/2013 11:47 AM

## 2013-06-15 NOTE — Progress Notes (Signed)
NUTRITION FOLLOW UP  Intervention:    No nutrition intervention warranted at this time  RD to continue to follow  Nutrition Dx:   Inadequate oral intake, resolved  Goal:   Patient to meet >/= 90% of estimated nutrition needs, met  Monitor:   PO intake, labs, weight trends, I/Os  Assessment:   48 y.o. female with history of CAD, status post stents 2004, type II DM, former smoker, presented to the ED with complaints of constipation and difficulty breathing. She gives approximately 2 weeks history of generally feeling unwell, abdominal bloating and constipation.  Patient s/p procedure 2/24: CORONARY ARTERY BYPASS GRAFTING x 4  ENDOSCOPIC VEIN HARVEST RIGHT LEG  Patient transferred from 2S-Surgical to 2W-Cardiac 3/2.  Patient reports a good appetite.  Breathing better.  PO intake 75-100% per flowsheet records.  RD feels patient is meeting estimated nutrition needs.  Height: Ht Readings from Last 1 Encounters:  06/04/13 5' 1.5" (1.562 m)    Weight Status:   Wt Readings from Last 1 Encounters:  06/15/13 155 lb 3.3 oz (70.4 kg)  admission weight 2/19:  150 lb (68.4 kg)  Re-estimated needs:  Kcal: 1700-1900 Protein: 80-90 gm Fluid: 1.7-1.9 L  Skin: closed incision on chest and right leg  Diet Order: Carb Control   Intake/Output Summary (Last 24 hours) at 06/15/13 1541 Last data filed at 06/15/13 0900  Gross per 24 hour  Intake    720 ml  Output      0 ml  Net    720 ml    Labs:   Recent Labs Lab 06/09/13 0500 06/09/13 1700  06/10/13 0505  06/12/13 0406 06/14/13 0250 06/15/13 0640  NA 145  --   < > 141  < > 138 137 140  K 4.0  --   < > 4.0  < > 4.8 4.0 4.5  CL 109  --   < > 106  < > 103 102 102  CO2 23  --   --  24  < > $R'24 24 26  'LM$ BUN 10  --   < > 18  < > $R'18 20 19  'EX$ CREATININE 0.47* 0.59  < > 0.56  < > 0.42* 0.44* 0.52  CALCIUM 8.1*  --   --  8.5  < > 8.5 8.9 9.4  MG 2.6* 2.4  --  2.5  --   --   --   --   GLUCOSE 113*  --   < > 114*  < > 130* 129* 177*   < > = values in this interval not displayed.  CBG (last 3)   Recent Labs  06/14/13 2133 06/15/13 0647 06/15/13 1131  GLUCAP 196* 162* 186*    Scheduled Meds: . amiodarone  200 mg Oral Q12H   Followed by  . [START ON 06/17/2013] amiodarone  200 mg Oral Daily  . aspirin EC  325 mg Oral Daily  . atorvastatin  80 mg Oral q1800  . carvedilol  3.125 mg Oral BID WC  . docusate sodium  200 mg Oral Daily  . enoxaparin (LOVENOX) injection  30 mg Subcutaneous Q24H  . furosemide  40 mg Oral Daily  . glipiZIDE  10 mg Oral BID WC  . insulin aspart  0-24 Units Subcutaneous TID AC & HS  . insulin detemir  10 Units Subcutaneous Daily  . levalbuterol  0.63 mg Nebulization TID  . lisinopril  2.5 mg Oral Daily  . pantoprazole  40 mg Oral QAC breakfast  .  potassium chloride  10 mEq Oral Daily  . sodium chloride  3 mL Intravenous Q12H    Continuous Infusions:    Arthur Holms, RD, LDN Pager #: [phone] After-Hours Pager #: [phone]

## 2013-06-15 NOTE — Progress Notes (Signed)
      [address]       [place] [address]             [phone]        7 Days Post-Op Procedure(s) (LRB): CORONARY ARTERY BYPASS GRAFTING (CABG) times four using left internal mammary and right saphenous vein. (N/A) INTRAOPERATIVE TRANSESOPHAGEAL ECHOCARDIOGRAM (N/A)  Subjective: Patient with complaints of swollen legs. She is passing flatus but no bowel movement yet.  Objective: Vital signs in last 24 hours: Temp:  [97.9 F (36.6 C)-98.6 F (37 C)] 98.3 F (36.8 C) (03/03 0544) Pulse Rate:  [83-95] 83 (03/03 0544) Cardiac Rhythm:  [-] Normal sinus rhythm (03/02 2124) Resp:  [17-18] 18 (03/03 0544) BP: (104-122)/(63-73) 104/65 mmHg (03/03 0544) SpO2:  [90 %-100 %] 91 % (03/03 0544) FiO2 (%):  [28 %] 28 % (03/02 0816) Weight:  [70.4 kg (155 lb 3.3 oz)] 70.4 kg (155 lb 3.3 oz) (03/03 0544)  Pre op weight 63 kg Current Weight  06/15/13 70.4 kg (155 lb 3.3 oz)      Intake/Output from previous day: 03/02 0701 - 03/03 0700 In: 600 [P.O.:600] Out: 150 [Urine:150]   Physical Exam:  Cardiovascular: RRR, no murmurs, gallops, or rubs. Pulmonary: Diminished at bases; no rales, wheezes, or rhonchi. Abdomen: Soft, non tender, bowel sounds present. Extremities: Bilateral lower extremity edema. Wounds: Clean and dry.  No erythema or signs of infection.  Lab Results: CBC: Recent Labs  06/15/13 0640  WBC 6.9  HGB 9.6*  HCT 29.8*  PLT 402*   BMET:  Recent Labs  06/14/13 0250 06/15/13 0640  NA 137 140  K 4.0 4.5  CL 102 102  CO2 24 26  GLUCOSE 129* 177*  BUN 20 19  CREATININE 0.44* 0.52  CALCIUM 8.9 9.4    PT/INR:  Lab Results  Component Value Date   INR 1.28 06/08/2013   INR 0.95 06/07/2013   ABG:  INR: Will add last result for INR, ABG once components are confirmed Will add last 4 CBG results once components are confirmed  Assessment/Plan:  1. CV - Previous PACs.SR. On Amiodarone 200 bid, Coreg 3.125 bid. Will not restart ACE as  bp labile. 2.  Pulmonary - CXR appears to show bilateral pleural effusions and bibasilar atelectasis, interstitial edema, and no pneumothorax.Encourage incentive spirometer. 3. Volume Overload - On Lasix 20 daily. Will increase to 40 daily. 4.  Acute blood loss anemia - H and H stable at 9.6 and 29.8 5.DM-CBGs 134/196/162. On Glipizide 10 bid and Insulin 10 daily. Pre op HGA1C 12.4. Will need follow up with medical doctor. 6. Remove EPW in am 7.LOC constipation  ZIMMERMAN,DONIELLE MPA-C 06/15/2013,8:08 AM

## 2013-06-15 NOTE — Progress Notes (Signed)
Subjective:  Day 7 s/p CABG x4; Breathing better today  Objective:   Vital Signs in the last 24 hours: Temp:  [97.9 F (36.6 C)-98.6 F (37 C)] 98.3 F (36.8 C) (03/03 0544) Pulse Rate:  [83-95] 87 (03/03 0831) Resp:  [18] 18 (03/03 0544) BP: (104-122)/(63-76) 110/76 mmHg (03/03 0831) SpO2:  [90 %-100 %] 95 % (03/03 0836) Weight:  [155 lb 3.3 oz (70.4 kg)] 155 lb 3.3 oz (70.4 kg) (03/03 0544)  Intake/Output from previous day: 03/02 0701 - 03/03 0700 In: 600 [P.O.:600] Out: 150 [Urine:150]  Medications: . amiodarone  200 mg Oral Q12H   Followed by  . [START ON 06/17/2013] amiodarone  200 mg Oral Daily  . aspirin EC  325 mg Oral Daily  . atorvastatin  80 mg Oral q1800  . carvedilol  3.125 mg Oral BID WC  . docusate sodium  200 mg Oral Daily  . enoxaparin (LOVENOX) injection  30 mg Subcutaneous Q24H  . furosemide  40 mg Oral Daily  . glipiZIDE  10 mg Oral BID WC  . insulin aspart  0-24 Units Subcutaneous TID AC & HS  . insulin detemir  10 Units Subcutaneous Daily  . lactulose  20 g Oral Once  . levalbuterol  0.63 mg Nebulization TID  . pantoprazole  40 mg Oral QAC breakfast  . potassium chloride  10 mEq Oral Daily  . sodium chloride  3 mL Intravenous Q12H       Physical Exam:   General appearance: alert, cooperative and no distress Neck: no JVD, supple, symmetrical, trachea midline and thyroid not enlarged, symmetric, no tenderness/mass/nodules Lungs: decreased BS at bases; no wheezing Heart: S1, S2 normal and 1/6 sem Abdomen: soft, non-tender; bowel sounds normal; no masses,  no organomegaly Extremities: mild lower extremity edema Neurologic: Grossly normal   Rate: 91  Rhythm: normal sinus rhythm  Lab Results:    Recent Labs  06/14/13 0250 06/15/13 0640  NA 137 140  K 4.0 4.5  CL 102 102  CO2 24 26  GLUCOSE 129* 177*  BUN 20 19  CREATININE 0.44* 0.52   No results found for this basename: TROPONINI, CK, MB,  in the last 72 hours Hepatic Function  Panel No results found for this basename: PROT, ALBUMIN, AST, ALT, ALKPHOS, BILITOT, BILIDIR, IBILI,  in the last 72 hours No results found for this basename: INR,  in the last 72 hours BNP (last 3 results)  Recent Labs  06/02/13 1506 06/05/13 0515  PROBNP 3307.0* 1757.0*    Lipid Panel     Component Value Date/Time   CHOL 278* 06/05/2013 0515   TRIG 295* 06/05/2013 0515   HDL 27* 06/05/2013 0515   CHOLHDL 10.3 06/05/2013 0515   VLDL 59* 06/05/2013 0515   LDLCALC 192* 06/05/2013 0515      Imaging:  Dg Chest 2 View  06/15/2013   CLINICAL DATA:  Post CABG, chest discomfort  EXAM: CHEST  2 VIEW  COMPARISON:  DG CHEST 1V PORT dated 06/13/2013  FINDINGS: Status post CABG. Coronary stent noted, stable. Stable mild cardiac enlargement. Moderate bilateral pleural effusions, appear stable on the left and possibly minimally larger on the right. Bilateral lower lobe consolidation may be due to atelectasis. No vascular congestion, pulmonary edema, or pneumothorax.  IMPRESSION: Moderate bilateral pleural effusions with underlying consolidation likely atelectasis, similar to the prior study.   Electronically Signed   By: Esperanza Heir M.D.   On: 06/15/2013 08:31      Assessment/Plan:  Principal Problem:   Acute systolic CHF (congestive heart failure) Active Problems:   DM (diabetes mellitus), type 2, uncontrolled   CAD (coronary artery disease), status post stents 2004   Anemia   Constipation   Mild aortic stenosis   S/P CABG x 4   I/O: +450 yesterday, -438 since admission. Wt 155. CXR with moderate bilateral pleural effusions and atelectasis. EF pre-op at cath  25 - 30%. Will add low dose ACE-I and start lisinopril at 2.5 mg and plan titration as BP allows. Agree with increased furosemide dose to 40 mg.   Lennette Biharihomas A. Daisi Kentner, MD, [facility] 06/15/2013, 9:24 AM

## 2013-06-16 DIAGNOSIS — R609 Edema, unspecified: Secondary | ICD-10-CM

## 2013-06-16 LAB — GLUCOSE, CAPILLARY
Glucose-Capillary: 151 mg/dL — ABNORMAL HIGH (ref 70–99)
Glucose-Capillary: 235 mg/dL — ABNORMAL HIGH (ref 70–99)
Glucose-Capillary: 103 mg/dL — ABNORMAL HIGH (ref 70–99)
Glucose-Capillary: 147 mg/dL — ABNORMAL HIGH (ref 70–99)

## 2013-06-16 MED ORDER — LACTULOSE 10 GM/15ML PO SOLN
10.0000 g | Freq: Every day | ORAL | Status: DC | PRN
Start: 2013-06-16 — End: 2013-06-17
  Administered 2013-06-16 (×2): 10 g via ORAL
  Filled 2013-06-16: qty 15

## 2013-06-16 MED ORDER — CARVEDILOL 3.125 MG PO TABS
4.6875 mg | ORAL_TABLET | Freq: Two times a day (BID) | ORAL | Status: DC
  Administered 2013-06-16: 4.6875 mg via ORAL
  Filled 2013-06-16 (×4): qty 1.5

## 2013-06-16 MED ORDER — LEVALBUTEROL HCL 0.63 MG/3ML IN NEBU: 0.6300 mg | INHALATION_SOLUTION | Freq: Four times a day (QID) | RESPIRATORY_TRACT | Status: DC | PRN

## 2013-06-16 MED ORDER — FUROSEMIDE 10 MG/ML IJ SOLN
40.0000 mg | Freq: Once | INTRAMUSCULAR | Status: AC
  Administered 2013-06-16: 40 mg via INTRAVENOUS
  Filled 2013-06-16: qty 4

## 2013-06-16 MED ORDER — INSULIN DETEMIR 100 UNIT/ML ~~LOC~~ SOLN
15.0000 [IU] | Freq: Every day | SUBCUTANEOUS | Status: DC
  Administered 2013-06-16 – 2013-06-18 (×3): 15 [IU] via SUBCUTANEOUS
  Filled 2013-06-16 (×3): qty 0.15

## 2013-06-16 MED ORDER — FUROSEMIDE 40 MG PO TABS
40.0000 mg | ORAL_TABLET | Freq: Every day | ORAL | Status: DC
  Filled 2013-06-16: qty 1

## 2013-06-16 MED ORDER — POLYETHYLENE GLYCOL 3350 17 G PO PACK
17.0000 g | PACK | Freq: Every day | ORAL | Status: DC
  Administered 2013-06-16 – 2013-06-18 (×3): 17 g via ORAL
  Filled 2013-06-16 (×3): qty 1

## 2013-06-16 NOTE — Progress Notes (Addendum)
       [address]       [place] [address]             [phone]          8 Days Post-Op Procedure(s) (LRB): CORONARY ARTERY BYPASS GRAFTING (CABG) times four using left internal mammary and right saphenous vein. (N/A) INTRAOPERATIVE TRANSESOPHAGEAL ECHOCARDIOGRAM (N/A)  Subjective: "I feel terrible- I'm all swole up and I can't walk." Also c/o constipation.    Objective: Vital signs in last 24 hours: Patient Vitals for the past 24 hrs:  BP Temp Temp src Pulse Resp SpO2 Weight  06/16/13 0550 108/70 mmHg - Oral 84 18 92 % 157 lb 10.1 oz (71.5 kg)  06/15/13 2142 118/69 mmHg 98.6 F (37 C) Oral 91 18 95 % -  06/15/13 1927 - - - - - 94 % -  06/15/13 0836 - - - - - 95 % -  06/15/13 0831 110/76 mmHg - - 87 - - -   Current Weight  06/16/13 157 lb 10.1 oz (71.5 kg)  PRE-OPERATIVE WEIGHT: 63 kg    Intake/Output from previous day: 03/03 0701 - 03/04 0700 In: 843 [P.O.:840; I.V.:3] Out: 1200 [Urine:1200]  CBGs 190-154-151    PHYSICAL EXAM:  Heart: RRR Lungs: Slightly diminished BS in bases Wound: Clean and dry Extremities: +LE edema bilaterally    Lab Results: CBC: Recent Labs  06/15/13 0640  WBC 6.9  HGB 9.6*  HCT 29.8*  PLT 402*   BMET:  Recent Labs  06/14/13 0250 06/15/13 0640  NA 137 140  K 4.0 4.5  CL 102 102  CO2 24 26  GLUCOSE 129* 177*  BUN 20 19  CREATININE 0.44* 0.52  CALCIUM 8.9 9.4    PT/INR: No results found for this basename: LABPROT, INR,  in the last 72 hours    Assessment/Plan: S/P Procedure(s) (LRB): CORONARY ARTERY BYPASS GRAFTING (CABG) times four using left internal mammary and right saphenous vein. (N/A) INTRAOPERATIVE TRANSESOPHAGEAL ECHOCARDIOGRAM (N/A)  CV- BPs and rhythm stable.  Continue current meds.  Vol overload- needs further diuresis. Will give Lasix IV today and watch.  DM- sugars still elevated on po meds + Levemir.  A1C=12.1.  Will increase Levemir and watch.   Constipation- her  presenting complaint was severe constipation at home and she has not had a BM since surgery. Will Rx LOC and start daily Miralax.  CRPI, pulm toilet.   LOS: 14 days    COLLINS,GINA H 06/16/2013  This afternoon feels better, less edema after lasix this morning On beta blocker , statin, ace, asa Home one to two days

## 2013-06-16 NOTE — Progress Notes (Addendum)
Subjective: Complains of bilateral leg swelling and weight gain (1 lb) since yestereday. Denies SOB and CP.  Objective: Vital signs in last 24 hours: Temp:  [98.6 F (37 C)] 98.6 F (37 C) (03/03 2142) Pulse Rate:  [84-91] 84 (03/04 0550) Resp:  [18] 18 (03/04 0550) BP: (108-118)/(69-76) 108/70 mmHg (03/04 0550) SpO2:  [92 %-95 %] 92 % (03/04 0550) Weight:  [157 lb 10.1 oz (71.5 kg)] 157 lb 10.1 oz (71.5 kg) (03/04 0550) Last BM Date: 06/09/13 (preop)  Intake/Output from previous day: 03/03 0701 - 03/04 0700 In: 843 [P.O.:840; I.V.:3] Out: 1200 [Urine:1200] Intake/Output this shift:    Medications Current Facility-Administered Medications  Medication Dose Route Frequency Provider Last Rate Last Dose  . 0.9 %  sodium chloride infusion  250 mL Intravenous PRN Delight Ovens, MD      . amiodarone (PACERONE) tablet 200 mg  200 mg Oral Q12H Delight Ovens, MD   200 mg at 06/15/13 2247   Followed by  . [START ON 06/17/2013] amiodarone (PACERONE) tablet 200 mg  200 mg Oral Daily Delight Ovens, MD      . aspirin EC tablet 325 mg  325 mg Oral Daily Delight Ovens, MD   325 mg at 06/15/13 1059  . atorvastatin (LIPITOR) tablet 80 mg  80 mg Oral q1800 Lewayne Bunting, MD   80 mg at 06/15/13 1801  . bisacodyl (DULCOLAX) EC tablet 10 mg  10 mg Oral Daily PRN Delight Ovens, MD       Or  . bisacodyl (DULCOLAX) suppository 10 mg  10 mg Rectal Daily PRN Delight Ovens, MD      . carvedilol (COREG) tablet 3.125 mg  3.125 mg Oral BID WC Delight Ovens, MD   3.125 mg at 06/16/13 0810  . docusate sodium (COLACE) capsule 200 mg  200 mg Oral Daily Delight Ovens, MD   200 mg at 06/15/13 1059  . enoxaparin (LOVENOX) injection 30 mg  30 mg Subcutaneous Q24H Delight Ovens, MD   30 mg at 06/15/13 1100  . glipiZIDE (GLUCOTROL XL) 24 hr tablet 10 mg  10 mg Oral BID WC Delight Ovens, MD   10 mg at 06/16/13 0810  . guaiFENesin (MUCINEX) 12 hr tablet 600 mg  600 mg Oral  Q12H PRN Delight Ovens, MD      . insulin aspart (novoLOG) injection 0-24 Units  0-24 Units Subcutaneous TID AC & HS Delight Ovens, MD   2 Units at 06/16/13 [phone]  . insulin detemir (LEVEMIR) injection 10 Units  10 Units Subcutaneous Daily Delight Ovens, MD   10 Units at 06/15/13 1100  . levalbuterol (XOPENEX) nebulizer solution 0.63 mg  0.63 mg Nebulization TID Delight Ovens, MD   0.63 mg at 06/15/13 1927  . lisinopril (PRINIVIL,ZESTRIL) tablet 2.5 mg  2.5 mg Oral Daily Lennette Bihari, MD   2.5 mg at 06/15/13 1100  . ondansetron (ZOFRAN) tablet 4 mg  4 mg Oral Q6H PRN Delight Ovens, MD       Or  . ondansetron [facility]) injection 4 mg  4 mg Intravenous Q6H PRN Delight Ovens, MD      . oxyCODONE (Oxy IR/ROXICODONE) immediate release tablet 5-10 mg  5-10 mg Oral Q3H PRN Delight Ovens, MD   10 mg at 06/14/13 2121  . pantoprazole (PROTONIX) EC tablet 40 mg  40 mg Oral QAC breakfast Delight Ovens, MD  40 mg at 06/16/13 0810  . potassium chloride (K-DUR,KLOR-CON) CR tablet 10 mEq  10 mEq Oral Daily Delight OvensEdward B Gerhardt, MD   10 mEq at 06/15/13 1059  . sodium chloride 0.9 % injection 3 mL  3 mL Intravenous Q12H Delight OvensEdward B Gerhardt, MD   3 mL at 06/15/13 2247  . sodium chloride 0.9 % injection 3 mL  3 mL Intravenous PRN Delight OvensEdward B Gerhardt, MD      . traMADol Janean Sark(ULTRAM) tablet 50-100 mg  50-100 mg Oral Q4H PRN Delight OvensEdward B Gerhardt, MD   50 mg at 06/16/13 0411    PE: General appearance: alert, cooperative and no distress Lungs: clear to auscultation bilaterally Heart: regular rate and rhythm, S1, S2 normal, no murmur, click, rub or gallop Extremities: 1+ bilateral LEE Pulses: 2+ and symmetric Skin: warm and dry Neurologic: Grossly normal  Lab Results:   Recent Labs  06/15/13 0640  WBC 6.9  HGB 9.6*  HCT 29.8*  PLT 402*   BMET  Recent Labs  06/14/13 0250 06/15/13 0640  NA 137 140  K 4.0 4.5  CL 102 102  CO2 24 26  GLUCOSE 129* 177*  BUN 20 19  CREATININE 0.44*  0.52  CALCIUM 8.9 9.4    Assessment/Plan    Principal Problem:   Acute systolic CHF (congestive heart failure) Active Problems:   DM (diabetes mellitus), type 2, uncontrolled   CAD (coronary artery disease), status post stents 2004   Anemia   Constipation   Mild aortic stenosis   S/P CABG x 4  Plan: Day 8 s/p CABG x 4. No chest pain or SOB. Low dose lisinopril, 2.5 mg, was initiated yesterday in the setting of reduced systolic function (EF 25-30%). Pt seems to be tolerating well. No adverse reactions. BP is stable. Renal function stable. She continues to have bilateral LEE. Continue Lasix. Maintaining NSR on telemetry. Continue amiodarone and Coreg, as well as ASA and statin.      LOS: 14 days    Brittainy M. Delmer IslamSimmons, PA-C 06/16/2013 8:25 AM   Patient seen and examined. Agree with assessment and plan. Breathing well but complaining of leg swelling. Tolerating initiation of ACE-I, will plan to titrate to 5 mg tomorrow if BP allows. Pulse in 80's-90. Will increase coreg to 4.6875 mg today and titrate to 6.25 bid. Weight 18 lbs increased from pre-op. To get lasix 40 mg iv today.   Lennette Biharihomas A. Maire Govan, MD, [facility] 06/16/2013 9:25 AM

## 2013-06-16 NOTE — Progress Notes (Signed)
CARDIAC REHAB PHASE I   PRE:  Rate/Rhythm: 80  BP:  Supine:   Sitting:   Standing:    SaO2: 93 RA  MODE:  Ambulation: 980 ft    POST:  Rate/Rhythm: 90  BP:  Supine:   Sitting: 100/60  Standing:    SaO2: 90 RA 1045-1105 Pt just out of room walking independently. Gait steady. Checked HR 80, RA sat 93% in hall. Walked with pt rest of walk. She was able to walk 890 feet , only c/o walk of legs getting tired due to the swelling in them. VS stable after walk. Pt to bathroom after walk. I encouraged two more walks today.  Melina Copa RN 06/16/2013 11:16 AM

## 2013-06-17 ENCOUNTER — Inpatient Hospital Stay (HOSPITAL_COMMUNITY): Payer: BC Managed Care – PPO

## 2013-06-17 LAB — BASIC METABOLIC PANEL
BUN: 13 mg/dL (ref 6–23)
CO2: 29 mEq/L (ref 19–32)
Calcium: 9.1 mg/dL (ref 8.4–10.5)
Chloride: 100 mEq/L (ref 96–112)
Creatinine, Ser: 0.56 mg/dL (ref 0.50–1.10)
GFR calc Af Amer: >90 mL/min (ref >90)
GFR calc non Af Amer: >90 mL/min (ref >90)
Glucose, Bld: 171 mg/dL — ABNORMAL HIGH (ref 70–99)
Potassium: 4.7 mEq/L (ref 3.7–5.3)
Sodium: 139 mEq/L (ref 137–147)

## 2013-06-17 LAB — CBC
HCT: 26.5 % — ABNORMAL LOW (ref 36.0–46.0)
Hemoglobin: 8.5 g/dL — ABNORMAL LOW (ref 12.0–15.0)
MCH: 30 pg (ref 26.0–34.0)
MCHC: 32.1 g/dL (ref 30.0–36.0)
MCV: 93.6 fL (ref 78.0–100.0)
Platelets: 403 10*3/uL — ABNORMAL HIGH (ref 150–400)
RBC: 2.83 MIL/uL — ABNORMAL LOW (ref 3.87–5.11)
RDW: 14.4 % (ref 11.5–15.5)
WBC: 7.2 10*3/uL (ref 4.0–10.5)

## 2013-06-17 LAB — GLUCOSE, CAPILLARY
Glucose-Capillary: 193 mg/dL — ABNORMAL HIGH (ref 70–99)
Glucose-Capillary: 272 mg/dL — ABNORMAL HIGH (ref 70–99)
Glucose-Capillary: 106 mg/dL — ABNORMAL HIGH (ref 70–99)
Glucose-Capillary: 244 mg/dL — ABNORMAL HIGH (ref 70–99)

## 2013-06-17 MED ORDER — LISINOPRIL 5 MG PO TABS
5.0000 mg | ORAL_TABLET | Freq: Every day | ORAL | Status: DC
  Administered 2013-06-17 – 2013-06-18 (×2): 5 mg via ORAL
  Filled 2013-06-17 (×2): qty 1

## 2013-06-17 MED ORDER — CARVEDILOL 6.25 MG PO TABS
6.2500 mg | ORAL_TABLET | Freq: Two times a day (BID) | ORAL | Status: DC
  Administered 2013-06-17 – 2013-06-18 (×2): 6.25 mg via ORAL
  Filled 2013-06-17 (×4): qty 1

## 2013-06-17 MED ORDER — LACTULOSE 10 GM/15ML PO SOLN
30.0000 g | Freq: Every day | ORAL | Status: DC | PRN
  Filled 2013-06-17: qty 45

## 2013-06-17 MED ORDER — MAGNESIUM CITRATE PO SOLN
1.0000 | Freq: Once | ORAL | Status: AC
  Administered 2013-06-17: 1 via ORAL
  Filled 2013-06-17: qty 296

## 2013-06-17 MED ORDER — FUROSEMIDE 10 MG/ML IJ SOLN
40.0000 mg | Freq: Once | INTRAMUSCULAR | Status: AC
  Administered 2013-06-17: 40 mg via INTRAVENOUS
  Filled 2013-06-17: qty 4

## 2013-06-17 NOTE — Progress Notes (Signed)
Inpatient Diabetes Program Recommendations  AACE/ADA: New Consensus Statement on Inpatient Glycemic Control (2013)  Target Ranges:  Prepandial:   less than 140 mg/dL      Peak postprandial:   less than 180 mg/dL (1-2 hours)      Critically ill patients:  140 - 180 mg/dL   Reason for Visit: Referral received.  Note patient Results for Isabel [name], Isabel [name] (MRN [id number]) as of 06/17/2013 13:13  Ref. Range 06/02/2013 15:06  Hemoglobin A1C Latest Range: <5.7 % 12.4 (H)   Diabetes history: Type 2 diabetes Outpatient Diabetes medications: Byetta 10 mcg bid, Glipizide XL 10 mg bid Current orders for Inpatient glycemic control: Glipizide XL 10 mg bid, Levemir 15 units daily, TCTS Novolog correction q 4 hours Note plans for patient to discharge home on Levemir.  Patient has been taught how to administer insulin using insulin pen.  She prefers insulin pen for insulin administration. MD please order Levemir pen and insulin pen needles (will need separate prescription for insulin pen needles).  Placed order for outpatient diabetes education per order.    Beryl Meager, RN, BC-ADM Inpatient Diabetes Coordinator Pager [phone]

## 2013-06-17 NOTE — Progress Notes (Signed)
[id number] Offered to walk with pt. She had walked once already and stated that she has had laxatives. Stated she felt like she was going to pop. Stated she would walk later. Education completed. Pt has seen discharge video. Gave diabetic and heart healthy diets. Encouraged pt to watch sodium with low EF. Discussed carb counting and importance of getting HGAIC down. Discussed CRP 2 and pt gave permission to refer to [place].  Luetta Nutting RN BSN 06/17/2013 2:21 PM

## 2013-06-17 NOTE — Progress Notes (Addendum)
      [address]       [place] [address]             [phone]      9 Days Post-Op Procedure(s) (LRB): CORONARY ARTERY BYPASS GRAFTING (CABG) times four using left internal mammary and right saphenous vein. (N/A) INTRAOPERATIVE TRANSESOPHAGEAL ECHOCARDIOGRAM (N/A)  Subjective:  Isabel [name] is feeling better this morning.  She thinks her swelling has improved some.  She did not have a bowel movement yesterday after Miralax or small dose of Lactulose  Objective: Vital signs in last 24 hours: Temp:  [98 F (36.7 C)-98.5 F (36.9 C)] 98.2 F (36.8 C) (03/05 0502) Pulse Rate:  [79-93] 93 (03/05 0502) Cardiac Rhythm:  [-] Normal sinus rhythm (03/05 0502) Resp:  [18] 18 (03/05 0502) BP: (88-124)/(53-64) 98/59 mmHg (03/05 0502) SpO2:  [93 %-96 %] 93 % (03/05 0502) Weight:  [157 lb 3 oz (71.3 kg)] 157 lb 3 oz (71.3 kg) (03/05 0502)  Intake/Output from previous day: 03/04 0701 - 03/05 0700 In: 1060 [P.O.:1060] Out: 2075 [Urine:2075]  General appearance: alert, cooperative and no distress Heart: regular rate and rhythm Lungs: clear to auscultation bilaterally Abdomen: soft, non-tender; bowel sounds normal; no masses,  no organomegaly Extremities: edema 1-2+ pitting edema Wound: clean and dry  Lab Results:  Recent Labs  06/15/13 0640 06/17/13 0255  WBC 6.9 7.2  HGB 9.6* 8.5*  HCT 29.8* 26.5*  PLT 402* 403*   BMET:  Recent Labs  06/15/13 0640 06/17/13 0255  NA 140 139  K 4.5 4.7  CL 102 100  CO2 26 29  GLUCOSE 177* 171*  BUN 19 13  CREATININE 0.52 0.56  CALCIUM 9.4 9.1    PT/INR: No results found for this basename: LABPROT, INR,  in the last 72 hours ABG    Component Value Date/Time   PHART 7.382 06/08/2013 1835   HCO3 25.2* 06/08/2013 1835   TCO2 23 06/09/2013 1712   ACIDBASEDEF 3.0* 06/08/2013 1720   O2SAT 99.0 06/08/2013 1835   CBG (last 3)   Recent Labs  06/16/13 1546 06/16/13 2047 06/17/13 0635  GLUCAP 103* 147* 193*     Assessment/Plan: S/P Procedure(s) (LRB): CORONARY ARTERY BYPASS GRAFTING (CABG) times four using left internal mammary and right saphenous vein. (N/A) INTRAOPERATIVE TRANSESOPHAGEAL ECHOCARDIOGRAM (N/A)  1. CV- NSR- continue Amiodarone, Coreg 2. Pulm- no acute issues 3. Renal- Creatinine is WNL, patient volume overloaded with LE pitting edema, weight is elevated 18 lbs above admission, will give IV Lasix today, may benefit from short course of Zaroxolyn 4.LOC Constipation- given small dose of Lactulose yesterday, will repeat dose at 30 gm, if no relief will try Mag Citrate 5. DM- sugars remain moderately controlled, continue current insulin regimen 6. Dispo- patient is medically stable, she wishes to go home today- if BM achieved will plan discharge later this afternoon  LOS: 15 days    BARRETT, ERIN 06/17/2013  Plan more diuretic today home tomorrow I have seen and examined Isabel [name] and agree with the above assessment  and plan.  Delight Ovens MD Beeper [phone] Office [phone] 06/17/2013 8:48 AM

## 2013-06-17 NOTE — Progress Notes (Signed)
Subjective:  Breathing better; No bowel movement since before surgery.  Objective:   Vital Signs in the last 24 hours: Temp:  [98 F (36.7 C)-98.5 F (36.9 C)] 98.2 F (36.8 C) (03/05 0502) Pulse Rate:  [79-93] 93 (03/05 0502) Resp:  [18] 18 (03/05 0502) BP: (88-124)/(53-64) 98/59 mmHg (03/05 0502) SpO2:  [93 %-96 %] 93 % (03/05 0502) Weight:  [157 lb 3 oz (71.3 kg)] 157 lb 3 oz (71.3 kg) (03/05 0502)  Intake/Output from previous day: 03/04 0701 - 03/05 0700 In: 1060 [P.O.:1060] Out: 2075 [Urine:2075]  Medications: . amiodarone  200 mg Oral Daily  . aspirin EC  325 mg Oral Daily  . atorvastatin  80 mg Oral q1800  . carvedilol  4.6875 mg Oral BID WC  . docusate sodium  200 mg Oral Daily  . enoxaparin (LOVENOX) injection  30 mg Subcutaneous Q24H  . furosemide  40 mg Intravenous Once  . glipiZIDE  10 mg Oral BID WC  . insulin aspart  0-24 Units Subcutaneous TID AC & HS  . insulin detemir  15 Units Subcutaneous Daily  . lisinopril  2.5 mg Oral Daily  . magnesium citrate  1 Bottle Oral Once  . pantoprazole  40 mg Oral QAC breakfast  . polyethylene glycol  17 g Oral Daily  . sodium chloride  3 mL Intravenous Q12H       Physical Exam:   General appearance: alert, cooperative and no distress Neck: no JVD, supple, symmetrical, trachea midline and thyroid not enlarged, symmetric, no tenderness/mass/nodules Lungs: clear to auscultation bilaterally Heart: S1, S2 normal, no click and no rub Abdomen: soft, non-tender; bowel sounds normal; no masses,  no organomegaly Extremities: 1+ mildly pitting LE edema Neurologic: Grossly normal   Rate: 84  Rhythm: normal sinus rhythm  Lab Results:    Recent Labs  06/15/13 0640 06/17/13 0255  NA 140 139  K 4.5 4.7  CL 102 100  CO2 26 29  GLUCOSE 177* 171*  BUN 19 13  CREATININE 0.52 0.56   No results found for this basename: TROPONINI, CK, MB,  in the last 72 hours Hepatic Function Panel No results found for this  basename: PROT, ALBUMIN, AST, ALT, ALKPHOS, BILITOT, BILIDIR, IBILI,  in the last 72 hours No results found for this basename: INR,  in the last 72 hours BNP (last 3 results)  Recent Labs  06/02/13 1506 06/05/13 0515  PROBNP 3307.0* 1757.0*    Lipid Panel     Component Value Date/Time   CHOL 278* 06/05/2013 0515   TRIG 295* 06/05/2013 0515   HDL 27* 06/05/2013 0515   CHOLHDL 10.3 06/05/2013 0515   VLDL 59* 06/05/2013 0515   LDLCALC 192* 06/05/2013 0515      Imaging:  Dg Chest 2 View  06/17/2013   CLINICAL DATA:  Sore chest post CABG  EXAM: CHEST  2 VIEW  COMPARISON:  DG CHEST 2 VIEW dated 06/15/2013; DG CHEST 1V PORT dated 06/13/2013; DG CHEST 1V PORT dated 06/12/2013; DG CHEST 1V PORT dated 06/10/2013  FINDINGS: Sternotomy wires overlie stable enlarged cardiac silhouette. There is bilateral pleural effusions unchanged. There is linear atelectasis in the left lower lobe similar to prior. No pulmonary edema. No pneumothorax.  IMPRESSION: 1. No interval change. 2. Cardiomegaly, bilateral pleural effusions, and right lower lobe atelectasis.   Electronically Signed   By: Genevive BiStewart  Edmunds M.D.   On: 06/17/2013 07:47      Assessment/Plan:   Principal Problem:   Acute systolic CHF (  congestive heart failure) Active Problems:   DM (diabetes mellitus), type 2, uncontrolled   CAD (coronary artery disease), status post stents 2004   Anemia   Constipation   Mild aortic stenosis   S/P CABG x 4  Good diuresis yesterday with -1015, now-1810 since admission. Maintaining NSR on amiodarone and carvedilol with rate in 80 - 90's. Will increase carvedilol to 6.25 mg bid.  Renal function is stable; further titrate lisinopril to 5 mg. To receive another dose of IV furosemide today. Consider dulcolax suppository for BM.    Lennette Bihari, MD, [facility] 06/17/2013, 8:26 AM

## 2013-06-18 LAB — GLUCOSE, CAPILLARY: Glucose-Capillary: 240 mg/dL — ABNORMAL HIGH (ref 70–99)

## 2013-06-18 MED ORDER — OXYCODONE HCL 5 MG PO TABS: 5.0000 mg | ORAL_TABLET | ORAL | Status: DC | PRN

## 2013-06-18 MED ORDER — FUROSEMIDE 40 MG PO TABS: 40.0000 mg | ORAL_TABLET | Freq: Every day | ORAL | Status: DC

## 2013-06-18 MED ORDER — ATORVASTATIN CALCIUM 80 MG PO TABS: 80.0000 mg | ORAL_TABLET | Freq: Every day | ORAL | Status: AC

## 2013-06-18 MED ORDER — TRAMADOL HCL 50 MG PO TABS: 50.0000 mg | ORAL_TABLET | ORAL | Status: DC | PRN

## 2013-06-18 MED ORDER — AMIODARONE HCL 200 MG PO TABS: 200.0000 mg | ORAL_TABLET | Freq: Every day | ORAL | Status: DC

## 2013-06-18 MED ORDER — LISINOPRIL 5 MG PO TABS: 5.0000 mg | ORAL_TABLET | Freq: Every day | ORAL | Status: DC

## 2013-06-18 MED ORDER — ASPIRIN EC 325 MG PO TBEC: 325.0000 mg | DELAYED_RELEASE_TABLET | Freq: Every day | ORAL | Status: DC

## 2013-06-18 MED ORDER — POTASSIUM CHLORIDE ER 20 MEQ PO TBCR: 20.0000 meq | EXTENDED_RELEASE_TABLET | Freq: Every day | ORAL | Status: DC

## 2013-06-18 MED ORDER — INSULIN PEN NEEDLE 29G X 5MM MISC: Status: DC

## 2013-06-18 MED ORDER — CARVEDILOL 6.25 MG PO TABS: 6.2500 mg | ORAL_TABLET | Freq: Two times a day (BID) | ORAL | Status: DC

## 2013-06-18 MED ORDER — POLYETHYLENE GLYCOL 3350 17 G PO PACK: 17.0000 g | PACK | Freq: Every day | ORAL | Status: DC

## 2013-06-18 MED ORDER — INSULIN DETEMIR 100 UNIT/ML FLEXPEN: 15.0000 [IU] | PEN_INJECTOR | Freq: Every day | SUBCUTANEOUS | Status: DC

## 2013-06-18 MED ORDER — INSULIN DETEMIR 100 UNIT/ML ~~LOC~~ SOLN: 15.0000 [IU] | Freq: Every day | SUBCUTANEOUS | Status: DC

## 2013-06-18 NOTE — Progress Notes (Addendum)
       [address]       [place] [address]             [phone]          10 Days Post-Op Procedure(s) (LRB): CORONARY ARTERY BYPASS GRAFTING (CABG) times four using left internal mammary and right saphenous vein. (N/A) INTRAOPERATIVE TRANSESOPHAGEAL ECHOCARDIOGRAM (N/A)  Subjective: Still having constipation, just drank prune juice but thinks she might need a suppository.      Objective: Vital signs in last 24 hours: Patient Vitals for the past 24 hrs:  BP Temp Temp src Pulse Resp SpO2 Weight  06/18/13 0500 96/61 mmHg 98.5 F (36.9 C) Oral 86 17 90 % 155 lb 3.2 oz (70.398 kg)  06/17/13 2122 98/62 mmHg 98.8 F (37.1 C) Oral 81 17 88 % -  06/17/13 1306 103/61 mmHg 97.7 F (36.5 C) - 95 - 94 % -   Current Weight  06/18/13 155 lb 3.2 oz (70.398 kg)  PRE-OPERATIVE WEIGHT: 63 kg    Intake/Output from previous day: 03/05 0701 - 03/06 0700 In: 720 [P.O.:720] Out: 1750 [Urine:1750]  CBGs 193-272-106-244    PHYSICAL EXAM:  Heart: RRR Lungs: Few crackles R base Wound: Clean and dry Extremities: +LE edema    Lab Results: CBC: Recent Labs  06/17/13 0255  WBC 7.2  HGB 8.5*  HCT 26.5*  PLT 403*   BMET:  Recent Labs  06/17/13 0255  NA 139  K 4.7  CL 100  CO2 29  GLUCOSE 171*  BUN 13  CREATININE 0.56  CALCIUM 9.1    PT/INR: No results found for this basename: LABPROT, INR,  in the last 72 hours    Assessment/[name]: S/P Procedure(s) (LRB): CORONARY ARTERY BYPASS GRAFTING (CABG) times four using left internal mammary and right saphenous vein. (N/A) INTRAOPERATIVE TRANSESOPHAGEAL ECHOCARDIOGRAM (N/A) CV- SBPs low normal, rhythm stable. Continue current meds.  Vol overload-Continue diuresis.  DM- sugars still elevated at time, continue po meds + Levemir. A1C=12.1. Constipation- her presenting complaint was severe constipation at home and she has not had a BM since surgery. Continue daily Miralax, Colace. If no results from prune  juice this am, Rx Dulcolax vs enema. Hopefully home later today if she has a BM. Will d/c pacing wires.    LOS: 16 days    COLLINS,Isabel [name]  Home today on lasix I have seen and examined Isabel [name].  Isabel Ovens MD Beeper [phone] Office [phone] [name] 1:39 PM

## 2013-06-18 NOTE — Progress Notes (Signed)
Subjective:  Breathing better; no BM yet  Objective:   Vital Signs in the last 24 hours: Temp:  [97.7 F (36.5 C)-98.8 F (37.1 C)] 98.5 F (36.9 C) (03/06 0500) Pulse Rate:  [81-95] 86 (03/06 0500) Resp:  [17] 17 (03/06 0500) BP: (96-103)/(61-62) 96/61 mmHg (03/06 0500) SpO2:  [88 %-94 %] 90 % (03/06 0500) Weight:  [155 lb 3.2 oz (70.398 kg)] 155 lb 3.2 oz (70.398 kg) (03/06 0500)  Intake/Output from previous day: 03/05 0701 - 03/06 0700 In: 720 [P.O.:720] Out: 1750 [Urine:1750]  Medications: . amiodarone  200 mg Oral Daily  . aspirin EC  325 mg Oral Daily  . atorvastatin  80 mg Oral q1800  . carvedilol  6.25 mg Oral BID WC  . docusate sodium  200 mg Oral Daily  . enoxaparin (LOVENOX) injection  30 mg Subcutaneous Q24H  . glipiZIDE  10 mg Oral BID WC  . insulin aspart  0-24 Units Subcutaneous TID AC & HS  . insulin detemir  15 Units Subcutaneous Daily  . lisinopril  5 mg Oral Daily  . pantoprazole  40 mg Oral QAC breakfast  . polyethylene glycol  17 g Oral Daily  . sodium chloride  3 mL Intravenous Q12H       Physical Exam:   General appearance: alert, cooperative and no distress Neck: no adenopathy, no JVD, supple, symmetrical, trachea midline and thyroid not enlarged, symmetric, no tenderness/mass/nodules Lungs: BS improved Heart: regular rate and rhythm and 1/6 sem Abdomen: soft, non-tender; bowel sounds normal; no masses,  no organomegaly Extremities: mild LE edema, better Neurologic: Grossly normal   Rate: 67  Rhythm: normal sinus rhythm  Lab Results:    Recent Labs  06/17/13 0255  NA 139  K 4.7  CL 100  CO2 29  GLUCOSE 171*  BUN 13  CREATININE 0.56   No results found for this basename: TROPONINI, CK, MB,  in the last 72 hours Hepatic Function Panel No results found for this basename: PROT, ALBUMIN, AST, ALT, ALKPHOS, BILITOT, BILIDIR, IBILI,  in the last 72 hours No results found for this basename: INR,  in the last 72 hours BNP (last 3  results)  Recent Labs  06/02/13 1506 06/05/13 0515  PROBNP 3307.0* 1757.0*    Lipid Panel     Component Value Date/Time   CHOL 278* 06/05/2013 0515   TRIG 295* 06/05/2013 0515   HDL 27* 06/05/2013 0515   CHOLHDL 10.3 06/05/2013 0515   VLDL 59* 06/05/2013 0515   LDLCALC 192* 06/05/2013 0515      Imaging:  Dg Chest 2 View  06/17/2013   CLINICAL DATA:  Sore chest post CABG  EXAM: CHEST  2 VIEW  COMPARISON:  DG CHEST 2 VIEW dated 06/15/2013; DG CHEST 1V PORT dated 06/13/2013; DG CHEST 1V PORT dated 06/12/2013; DG CHEST 1V PORT dated 06/10/2013  FINDINGS: Sternotomy wires overlie stable enlarged cardiac silhouette. There is bilateral pleural effusions unchanged. There is linear atelectasis in the left lower lobe similar to prior. No pulmonary edema. No pneumothorax.  IMPRESSION: 1. No interval change. 2. Cardiomegaly, bilateral pleural effusions, and right lower lobe atelectasis.   Electronically Signed   By: Genevive BiStewart  Edmunds M.D.   On: 06/17/2013 07:47      Assessment/Plan:   Principal Problem:   Acute systolic CHF (congestive heart failure) Active Problems:   DM (diabetes mellitus), type 2, uncontrolled   CAD (coronary artery disease), status post stents 2004   Anemia   Constipation  Mild aortic stenosis   S/P CABG x 4  I/O excellent diuresis yesterday - 1030; now -2840 since admission.Lost 2 lbs since yesterday; wt 155 today. No success with BM yet; consider dulcolax suppository. No AF, maintaining NSR. Tolerating med titration from yesterday with further titration as outpatient.    Lennette Bihari, MD, [facility] 06/18/2013, 8:48 AM

## 2013-06-19 ENCOUNTER — Telehealth: Payer: Self-pay | Admitting: Thoracic Surgery (Cardiothoracic Vascular Surgery)

## 2013-06-19 NOTE — Telephone Encounter (Signed)
Patient called because she only got a prescription for levemir insulin at the time of discharge and no prescription for Novolog flex-pen or other insulin to take during the daytime.  The discharge summary makes no mention of any intention to give insulin other than levemir long acting.  The patient states that a nurse told her she should have 2 types of insulin.  I suggested that she call the office on Monday to clarify the plan, but I was not going to alter the plan clearly outlined in her discharge summary.  Smrithi Pigford H 06/19/2013 1:12 PM

## 2013-06-21 LAB — GLUCOSE, CAPILLARY: Glucose-Capillary: 201 mg/dL — ABNORMAL HIGH (ref 70–99)

## 2013-06-23 ENCOUNTER — Other Ambulatory Visit: Payer: Self-pay | Admitting: *Deleted

## 2013-06-23 DIAGNOSIS — I2510 Atherosclerotic heart disease of native coronary artery without angina pectoris: Secondary | ICD-10-CM

## 2013-06-29 ENCOUNTER — Ambulatory Visit (INDEPENDENT_AMBULATORY_CARE_PROVIDER_SITE_OTHER): Payer: BC Managed Care – PPO | Admitting: Cardiovascular Disease

## 2013-06-29 ENCOUNTER — Encounter: Payer: Self-pay | Admitting: Cardiovascular Disease

## 2013-06-29 VITALS — BP 121/78 | HR 80 | Ht 61.0 in | Wt 147.0 lb

## 2013-06-29 DIAGNOSIS — I350 Nonrheumatic aortic (valve) stenosis: Secondary | ICD-10-CM

## 2013-06-29 DIAGNOSIS — I359 Nonrheumatic aortic valve disorder, unspecified: Secondary | ICD-10-CM

## 2013-06-29 DIAGNOSIS — IMO0002 Reserved for concepts with insufficient information to code with codable children: Secondary | ICD-10-CM

## 2013-06-29 DIAGNOSIS — E785 Hyperlipidemia, unspecified: Secondary | ICD-10-CM

## 2013-06-29 DIAGNOSIS — I10 Essential (primary) hypertension: Secondary | ICD-10-CM

## 2013-06-29 DIAGNOSIS — I2589 Other forms of chronic ischemic heart disease: Secondary | ICD-10-CM

## 2013-06-29 DIAGNOSIS — I255 Ischemic cardiomyopathy: Secondary | ICD-10-CM

## 2013-06-29 DIAGNOSIS — E1165 Type 2 diabetes mellitus with hyperglycemia: Secondary | ICD-10-CM

## 2013-06-29 DIAGNOSIS — Z951 Presence of aortocoronary bypass graft: Secondary | ICD-10-CM

## 2013-06-29 DIAGNOSIS — I5022 Chronic systolic (congestive) heart failure: Secondary | ICD-10-CM

## 2013-06-29 DIAGNOSIS — I2510 Atherosclerotic heart disease of native coronary artery without angina pectoris: Secondary | ICD-10-CM

## 2013-06-29 DIAGNOSIS — IMO0001 Reserved for inherently not codable concepts without codable children: Secondary | ICD-10-CM

## 2013-06-29 NOTE — Patient Instructions (Signed)
Continue all current medications. Follow up in  8-10 weeks  

## 2013-06-29 NOTE — Progress Notes (Signed)
Patient ID: Isabel GaskinsSusan J [name], female   DOB: [birth year], 48 y.o.   MRN: [id number]      SUBJECTIVE: The patient is a 48 year old woman who recently underwent 4 vessel CABG. She has a history of coronary artery disease with prior stenting in 2004. She also has hypertension, hyperlipidemia, poorly controlled diabetes mellitus, ischemic cardiomyopathy (EF 25-30%), and at least mild aortic stenosis (low output, low gradient). Additional echocardiographic findings showed restrictive diastolic dysfunction, mild aortic regurgitation, mild mitral regurgitation, severe left atrial enlargement, mild right ventricular enlargement, moderate right atrial enlargement, and a small to medium size pericardial effusion. Chest x-ray in March 5 showed bilateral pleural effusions. BUN/creatinine 13/0.56 on 06/17/13. ECG today shows sinus rhythm, 80 beats per minute, old anteroseptal infarct, and a nonspecific T wave abnormality. She is feeling well. She denies chest pain, shortness of breath, palpitations, lightheadedness, dizziness, fevers, chills, and leg swelling. She has noticed that her weight has been gradually coming down by 1 or 2 pounds.      No Known Allergies  Current Outpatient Prescriptions  Medication Sig Dispense Refill  . amiodarone (PACERONE) 200 MG tablet Take 1 tablet (200 mg total) by mouth daily.  30 tablet  1  . aspirin EC 325 MG tablet Take 1 tablet (325 mg total) by mouth daily.      Marland Kitchen. atorvastatin (LIPITOR) 80 MG tablet Take 1 tablet (80 mg total) by mouth daily at 6 PM.  30 tablet  1  . carvedilol (COREG) 6.25 MG tablet Take 1 tablet (6.25 mg total) by mouth 2 (two) times daily with a meal.  60 tablet  1  . furosemide (LASIX) 40 MG tablet Take 1 tablet (40 mg total) by mouth daily.  30 tablet  1  . Insulin Detemir (LEVEMIR) 100 UNIT/ML Pen Inject 19 Units into the skin daily.      . Insulin Pen Needle 29G X 5MM MISC 1 box  1 each  2  . lisinopril (PRINIVIL,ZESTRIL) 5 MG tablet Take 1 tablet  (5 mg total) by mouth daily.  30 tablet  1  . metFORMIN (GLUCOPHAGE-XR) 500 MG 24 hr tablet Take 500 mg by mouth daily with breakfast.       . polyethylene glycol (MIRALAX / GLYCOLAX) packet Take 17 g by mouth daily.  14 each  1  . potassium chloride 20 MEQ TBCR Take 20 mEq by mouth daily.  30 tablet  1  . traMADol (ULTRAM) 50 MG tablet Take 1-2 tablets (50-100 mg total) by mouth every 4 (four) hours as needed for moderate pain.  30 tablet  1  . ALPRAZolam (XANAX) 1 MG tablet Take 1 mg by mouth 4 (four) times daily as needed.       No current facility-administered medications for this visit.    Past Medical History  Diagnosis Date  . Coronary artery disease   . Diabetes mellitus without complication   . Renal disorder     kidney stones  . CHF (congestive heart failure)     Past Surgical History  Procedure Laterality Date  . Cardiac stents    . Back surgery    . Kidney stone surgery    . Fallopian tube and 1 ovary removed    . Tonsillectomy    . Coronary artery bypass graft N/A 06/08/2013    Procedure: CORONARY ARTERY BYPASS GRAFTING (CABG) times four using left internal mammary and right saphenous vein.;  Surgeon: Delight OvensEdward B Gerhardt, MD;  Location: [facility] OR;  Service: Open Heart Surgery;  Laterality: N/A;  . Intraoperative transesophageal echocardiogram N/A 06/08/2013    Procedure: INTRAOPERATIVE TRANSESOPHAGEAL ECHOCARDIOGRAM;  Surgeon: Delight Ovens, MD;  Location: [facility] OR;  Service: Open Heart Surgery;  Laterality: N/A;    History   Social History  . Marital Status: Married    Spouse Name: N/A    Number of Children: N/A  . Years of Education: N/A   Occupational History  . Not on file.   Social History Main Topics  . Smoking status: Former Smoker -- 1.00 packs/day for 10 years    Types: Cigarettes    Quit date: 04/15/2000  . Smokeless tobacco: Never Used     Comment: QUIT SMOKING YEARS AGO "  . Alcohol Use: Yes     Comment: occ  . Drug Use: No  . Sexual Activity: Not  on file   Other Topics Concern  . Not on file   Social History Narrative  . No narrative on file     Filed Vitals:   06/29/13 0835  BP: 121/78  Height: 5\' 1"  (1.549 m)  Weight: 147 lb (66.679 kg)    PHYSICAL EXAM General: NAD Neck: No JVD, no thyromegaly. Lungs: Diminished at bases bilaterally with faint crackles at bases, with normal respiratory effort. CV: Nondisplaced PMI.  Healing midline sternotomy. Regular rate and rhythm, normal S1/S2, no S3/S4, no murmur. No pretibial or periankle edema.  No carotid bruit.  Normal pedal pulses.  Abdomen: Soft, nontender, no hepatosplenomegaly, no distention.  Neurologic: Alert and oriented x 3.  Psych: Normal affect. Extremities: No clubbing or cyanosis.   ECG: reviewed and available in electronic records.  Echo (06-03-2013): Study Conclusions  - Left ventricle: The cavity size was normal. Wall thickness was normal. Systolic function was severely reduced. The estimated ejection fraction was in the range of 25% to 30%. Doppler parameters are consistent with restrictive physiology, indicative of decreased left ventricular diastolic compliance and/or increased left atrial pressure. - Regional wall motion abnormality: Hypokinesis of the mid anterior, basal-mid anteroseptal, mid inferoseptal, mid anterolateral, apical septal, apical lateral, and apical myocardium. - Aortic valve: Mildly calcified annulus. Trileaflet; mildly thickened leaflets. There was mild stenosis. The low gradient is liklely due to decreased cardiac output in the setting of systolic dysfunction. Mild regurgitation. Valve area: 1.45cm^2(VTI). Valve area: 1.51cm^2 (Vmax). - Mitral valve: Mildly calcified annulus. Mildly thickened leaflets . Mild regurgitation. - Left atrium: The atrium was severely dilated. - Right ventricle: The cavity size was mildly dilated. - Right atrium: The atrium was moderately dilated. - Pulmonary arteries: Systolic pressure was  moderately increased. - Inferior vena cava: The vessel was normal in size; the respirophasic diameter changes were in the normal range (= 50%); findings are consistent with normal central venous pressure. - Pericardium, extracardiac: There is a mild to moderate sized pericardial effusion adjacent to the RV free wall. There is mild respiratory inflow variation, however the IVC is normal and there is no evidence of early RA or RV diastolic collapse. Overall findings not consistent with tamponade physiology. There is a large left pleural effusion.  CABG: SURGICAL PROCEDURE: Coronary artery bypass grafting x4 with the left  internal mammary to the left anterior descending coronary artery,  sequential reverse saphenous vein graft to the first obtuse marginal and  distal circumflex, reverse saphenous vein graft to the distal right  coronary artery    ASSESSMENT AND PLAN: 1. CAD s/p 4-v CABG: doing well postoperatively. Denies symptoms of chest pain and shortness of breath. Continue ASA, Lipitor, Coreg,  and lisinopril. Enrolled in cardiac rehabilitation, and should start next week. 2. HTN: controlled on current therapy. 3. Hyperlipidemia: continue high-dose Lipitor. 4. Ischemic cardiomyopathy/chronic systolic heart failure: appears euvolemic. Sodium-restricted diet emphasized, and handouts were provided. She also knows to weigh herself daily. LV systolic function will need to be reevaluated in the future, to see if she is a candidate for AICD. 5. Diabetes mellitus: management per Dr. Dimas Aguas (PCP).  Dispo: f/u 8-10 weeks. Prentice Docker, M.D., F.A.C.C.

## 2013-07-01 ENCOUNTER — Ambulatory Visit (INDEPENDENT_AMBULATORY_CARE_PROVIDER_SITE_OTHER): Payer: Self-pay | Admitting: Cardiothoracic Surgery

## 2013-07-01 ENCOUNTER — Ambulatory Visit
Admission: RE | Admit: 2013-07-01 | Discharge: 2013-07-01 | Disposition: A | Discharge status: Home / Self Care | Payer: BC Managed Care – PPO | Source: Ambulatory Visit | Attending: Cardiothoracic Surgery | Admitting: Cardiothoracic Surgery

## 2013-07-01 ENCOUNTER — Encounter: Payer: Self-pay | Admitting: Cardiothoracic Surgery

## 2013-07-01 VITALS — BP 122/78 | HR 83 | Resp 20 | Ht 61.0 in | Wt 144.0 lb

## 2013-07-01 DIAGNOSIS — I2510 Atherosclerotic heart disease of native coronary artery without angina pectoris: Secondary | ICD-10-CM

## 2013-07-01 DIAGNOSIS — Z951 Presence of aortocoronary bypass graft: Secondary | ICD-10-CM

## 2013-07-01 NOTE — Progress Notes (Signed)
[address]       [place] [address]             [phone]      Isabel [name] [facility] Medical Record #[id number] Date of Birth: 28-Jul-1965  Referring: Isabel Poche, MD Primary Care: Isabel Flavin, MD  Chief Complaint:   POST OP FOLLOW UP 06/08/2013  OPERATIVE REPORT  PREOPERATIVE DIAGNOSES: Severe three-vessel coronary artery disease  with recurrent obstruction of the previous stent and severe ischemic  cardiomyopathy with ejection fraction of 20% to 25%.  POSTOPERATIVE DIAGNOSES: Severe three-vessel coronary artery disease  with recurrent obstruction of the previous stent and severe ischemic  cardiomyopathy with ejection fraction of 20% to 25%.  SURGICAL PROCEDURE: Coronary artery bypass grafting x4 with the left  internal mammary to the left anterior descending coronary artery,  sequential reverse saphenous vein graft to the first obtuse marginal and  distal circumflex, reverse saphenous vein graft to the distal right  coronary artery with right thigh and leg endo vein harvesting and  placement of right femoral arterial line.  SURGEON: Isabel Ovens MD  History of Present Illness:     Patient returns to the office day after recent coronary artery bypass grafting she presented with significant congestive heart failure symptoms with depressed ejection fraction to 20-25%. She underwent coronary artery bypass grafting February 24. Since discharge she continues to improve she notes no overt symptoms of congestive heart failure, has been seeing primary care and adjusting her diabetes treatment. Preoperatively she was not on insulin but subsequently has gone home on insulin. She notes she is doing much better about glucose control.     Past Medical History  Diagnosis Date  . Coronary artery disease   . Diabetes mellitus with complication of CAD   . Renal disorder     kidney stones  . CHF (congestive heart failure)      History  Smoking  status  . Former Smoker -- 1.00 packs/day for 10 years  . Types: Cigarettes  . Quit date: 04/15/2000  Smokeless tobacco  . Never Used    Comment: QUIT SMOKING YEARS AGO "    History  Alcohol Use  . Yes    Comment: occ     No Known Allergies  Current Outpatient Prescriptions  Medication Sig Dispense Refill  . ALPRAZolam (XANAX) 1 MG tablet Take 1 mg by mouth 4 (four) times daily as needed.      Marland Kitchen amiodarone (PACERONE) 200 MG tablet Take 1 tablet (200 mg total) by mouth daily.  30 tablet  1  . aspirin EC 325 MG tablet Take 1 tablet (325 mg total) by mouth daily.      Marland Kitchen atorvastatin (LIPITOR) 80 MG tablet Take 1 tablet (80 mg total) by mouth daily at 6 PM.  30 tablet  1  . carvedilol (COREG) 6.25 MG tablet Take 1 tablet (6.25 mg total) by mouth 2 (two) times daily with a meal.  60 tablet  1  . furosemide (LASIX) 40 MG tablet Take 1 tablet (40 mg total) by mouth daily.  30 tablet  1  . Insulin Detemir (LEVEMIR) 100 UNIT/ML Pen Inject 19 Units into the skin daily.      . Insulin Pen Needle 29G X MISC 1 box  1 each  2  . lisinopril (PRINIVIL,ZESTRIL) 5 MG tablet Take 1 tablet (5 mg total) by mouth daily.  30 tablet  1  . metFORMIN (GLUCOPHAGE-XR) 500  MG 24 hr tablet Take 500 mg by mouth daily with breakfast.       . polyethylene glycol (MIRALAX / GLYCOLAX) packet Take 17 g by mouth daily.  14 each  1  . potassium chloride 20 MEQ TBCR Take 20 mEq by mouth daily.  30 tablet  1  . traMADol (ULTRAM) 50 MG tablet Take 1-2 tablets (50-100 mg total) by mouth every 4 (four) hours as needed for moderate pain.  30 tablet  1   No current facility-administered medications for this visit.       Physical Exam: BP 122/78  Pulse 83  Resp 20  Ht 5\' 1"  (1.549 m)  Wt 144 lb (65.318 kg)  BMI 27.22 kg/m2  SpO2 97%  General appearance: alert and cooperative Neurologic: intact Heart: regular rate and rhythm, S1, S2 normal, no murmur, click, rub or gallop Lungs: diminished breath sounds  LLL Abdomen: soft, non-tender; bowel sounds normal; no masses,  no organomegaly Extremities: extremities normal, atraumatic, no cyanosis  edema very mild edema right ankle and Homans sign is negative, no sign of DVT Wound: Her sternum is stable and well healed the right leg vein harvest sites also healing well   Diagnostic Studies & Laboratory data:     Recent Radiology Findings:   Dg Chest 2 View  07/01/2013   CLINICAL DATA:  History of CABG 2/15, sternal pain  EXAM: CHEST  2 VIEW  COMPARISON:  06/21/2013  FINDINGS: Cardiomediastinal silhouette is stable. Status post CABG. Right lung is clear. Small left pleural effusion with left basilar atelectasis or infiltrate. No pulmonary edema.  Bony thorax is stable.  IMPRESSION: Status post CABG. Small left pleural effusion with left basilar atelectasis or infiltrate. No pulmonary edema.   Electronically Signed   By: Natasha MeadLiviu  Pop M.D.   On: 07/01/2013 08:47      Recent Lab Findings: Lab Results  Component Value Date   WBC 7.2 06/17/2013   HGB 8.5* 06/17/2013   HCT 26.5* 06/17/2013   PLT 403* 06/17/2013   GLUCOSE 171* 06/17/2013   CHOL 278* 06/05/2013   TRIG 295* 06/05/2013   HDL 27* 06/05/2013   LDLCALC 192* 06/05/2013   ALT 16 06/03/2013   AST 12 06/03/2013   NA 139 06/17/2013   K 4.7 06/17/2013   CL 100 06/17/2013   CREATININE 0.56 06/17/2013   BUN 13 06/17/2013   CO2 29 06/17/2013   TSH 0.241* 06/10/2013   INR 1.28 06/08/2013   HGBA1C 12.4* 06/02/2013      Assessment / Plan:     Overall the patient is doing very well postoperatively, and appears to be well engaged in her continued long-term care, with depressed LV function and previously poor control diabetes. She will have a followup echocardiogram in the future per cardiology to reevaluate her postoperative LV function. I have not made very specific return appointment to be seen in the surgical office but would be glad to see her at her or cardiology requested anytime. She was encouraged to start in  cardiac rehabilitation in the near future Instructions about weight lifting limitations and driving were given to the patient.   Isabel OvensEdward B [name] MD      [address] [place], [id number] Office [phone]   Beeper [phone]  07/01/2013 10:10 AM

## 2013-07-01 NOTE — Patient Instructions (Signed)
    [address]       [place] [address]             [phone]       Coronary Artery Bypass Grafting  Care After  Refer to this sheet in the next few weeks. These instructions provide you with information on caring for yourself after your procedure. Your caregiver may also give you more specific instructions. Your treatment has been planned according to current medical practices, but problems sometimes occur. Call your caregiver if you have any problems or questions after your procedure.  Recovery from open heart surgery will be different for everyone. Some people feel well after 3 or 4 weeks, while for others it takes longer. After heart surgery, it may be normal to:  Not have an appetite, feel nauseated by the smell of food, or only want to eat a small amount.   Be constipated because of changes in your diet, activity, and medicines. Eat foods high in fiber. Add fresh fruits and vegetables to your diet. Stool softeners may be helpful.   Feel sad or unhappy. You may be frustrated or cranky. You may have good days and bad days. Do not give up. Talk to your caregiver if you do not feel better.   Feel weakness and fatigue. You many need physical therapy or cardiac rehabilitation to get your strength back.   Develop an irregular heartbeat called atrial fibrillation. Symptoms of atrial fibrillation are a fast, irregular heartbeat or feelings of fluttery heartbeats, shortness of breath, low blood pressure, and dizziness. If these symptoms develop, see your caregiver right away.  MEDICATION  Have a list of all the medicines you will be taking when you leave the hospital. For every medicine, know the following:   Name.   Exact dose.   Time of day to be taken.   How often it should be taken.   Why you are taking it.   Ask which medicines should or should not be taken together. If you take more than one heart medicine, ask if it is okay to take them together. Some  heart medicines should not be taken at the same time because they may lower your blood pressure too much.   Narcotic pain medicine can cause constipation. Eat fresh fruits and vegetables. Add fiber to your diet. Stool softener medicine may help relieve constipation.   Keep a copy of your medicines with you at all times.   Do not add or stop taking any medicine until you check with your caregiver.   Medicines can have side effects. Call your caregiver who prescribed the medicine if you:   Start throwing up, have diarrhea, or have stomach pain.   Feel dizzy or lightheaded when you stand up.   Feel your heart is skipping beats or is beating too fast or too slow.   Develop a rash.   Notice unusual bruising or bleeding.  HOME CARE INSTRUCTIONS  After heart surgery, it is important to learn how to take your pulse. Have your caregiver show you how to take your pulse.   Use your incentive spirometer. Ask your caregiver how long after surgery you need to use it.  Care of your chest incision  Tell your caregiver right away if you notice clicking in your chest (sternum).   Support your chest with a pillow or your arms when you take deep breaths and cough.   Follow your caregiver's instructions about when you can bathe or   swim.   Protect your incision from sunlight during the first year to keep the scar from getting dark.   Tell your caregiver if you notice:   Increased tenderness of your incision.   Increased redness or swelling around your incision.   Drainage or pus from your incision.  Care of your leg incision(s)  Avoid crossing your legs.   Avoid sitting for long periods of time. Change positions every half hour.   Elevate your leg(s) when you are sitting.   Check your leg(s) daily for swelling. Check the incisions for redness or drainage.   Diet is very important to heart health.   Eat plenty of fresh fruits and vegetables. Meats should be lean cut. Avoid canned,  processed, and fried foods.   Talk to a dietician. They can teach you how to make healthy food and drink choices.  Weight  Weigh yourself every day. This is important because it helps to know if you are retaining fluid that may make your heart and lungs work harder.   Use the same scale each time.   Weigh yourself every morning at the same time. You should do this after you go to the bathroom, but before you eat breakfast.   Your weight will be more accurate if you do not wear any clothes.   Record your weight.   Tell your caregiver if you have gained 2 pounds or more overnight.  Activity Stop any activity at once if you have chest pain, shortness of breath, irregular heartbeats, or dizziness. Get help right away if you have any of these symptoms.  Bathing.  Avoid soaking in a bath or hot tub until your incisions are healed.   Rest. You need a balance of rest and activity.   Exercise. Exercise per your caregiver's advice. You may need physical therapy or cardiac rehabilitation to help strengthen your muscles and build your endurance.   Climbing stairs. Unless your caregiver tells you not to climb stairs, go up stairs slowly and rest if you tire. Do not pull yourself up by the handrail.   Driving a car. Follow your caregiver's advice on when you may drive. You may ride as a passenger at any time. When traveling for long periods of time in a car, get out of the car and walk around for a few minutes every 2 hours.   Lifting. Avoid lifting, pushing, or pulling anything heavier than 10 pounds for 6 weeks after surgery or as told by your caregiver.   Returning to work. Check with your caregiver. People heal at different rates. Most people will be able to go back to work 6 to 12 weeks after surgery.   Sexual activity. You may resume sexual relations as told by your caregiver.  SEEK MEDICAL CARE IF:  Any of your incisions are red, painful, or have any type of drainage coming from them.     You have an oral temperature above 101.5 F .   You have ankle or leg swelling.   You have pain in your legs.   You have weight gain of 2 or more pounds a day.   You feel dizzy or lightheaded when you stand up.  SEEK IMMEDIATE MEDICAL CARE IF:  You have angina or chest pain that goes to your jaw or arms. Call your local emergency services right away.   You have shortness of breath at rest or with activity.   You have a fast or irregular heartbeat (arrhythmia).   There is   a "clicking" in your sternum when you move.   You have numbness or weakness in your arms or legs.  MAKE SURE YOU:  Understand these instructions.   Will watch your condition.   Will get help right away if you are not doing well or get worse.    No lifting over 25 lbs for 3 months May start driving

## 2013-08-03 ENCOUNTER — Encounter: Payer: BC Managed Care – PPO | Attending: Cardiothoracic Surgery | Admitting: *Deleted

## 2013-08-03 ENCOUNTER — Encounter: Payer: Self-pay | Admitting: *Deleted

## 2013-08-03 VITALS — Ht 61.5 in | Wt 148.0 lb

## 2013-08-03 DIAGNOSIS — IMO0002 Reserved for concepts with insufficient information to code with codable children: Secondary | ICD-10-CM

## 2013-08-03 DIAGNOSIS — E119 Type 2 diabetes mellitus without complications: Secondary | ICD-10-CM | POA: Insufficient documentation

## 2013-08-03 DIAGNOSIS — I252 Old myocardial infarction: Secondary | ICD-10-CM | POA: Insufficient documentation

## 2013-08-03 DIAGNOSIS — Z951 Presence of aortocoronary bypass graft: Secondary | ICD-10-CM | POA: Insufficient documentation

## 2013-08-03 DIAGNOSIS — E1165 Type 2 diabetes mellitus with hyperglycemia: Secondary | ICD-10-CM

## 2013-08-03 DIAGNOSIS — Z713 Dietary counseling and surveillance: Secondary | ICD-10-CM | POA: Insufficient documentation

## 2013-08-03 NOTE — Patient Instructions (Addendum)
Plan:  Aim for 2-3 Carb Choices per meal (30-45 grams) +/- 1 either way  Aim for 0-15 Carbs per snack if hungry  Include protein in moderation with your meals and snacks Consider reading food labels for Total Carbohydrate and Fat Grams of foods Continue with exercise Continue checking BG at alternate times per day as directed by MD  Consider taking medication as directed by MD  Consider [facility] Protein Bars for snacks Consider Light and Fit Yogurt Always have protein and Carbs together

## 2013-08-03 NOTE — Progress Notes (Signed)
Appt start time: 1630 end time:  1800.   Assessment:  Patient was seen on  08/03/13 for individual diabetes education. Isabel [name] comes for DSME following heart attack and bypass surgery. She will soon beginning cardiac rehab in [place].   Current HbA1c: 11% ?  FBS avg 236 mg/dl  Preferred Learning Style:   No preference indicated   Learning Readiness:   Change in progress  MEDICATIONS: Levemir, Metformin  DIETARY INTAKE:  B ( AM): Activia yogurt, cheerios, sweet-n-low  Snk ( AM): peanut crackers  L ( PM): turkey/chicken whole wheat bread, mayonnaise, mustard, baked lays potato chips Snk ( PM): nuts D ( PM): fish (broiled), chicken (baked), broccoli, green beans, (macaroni & cheese), peas Snk ( PM): none Beverages: 2% milk, water(lemon), diet gingerAle, unsweet tea  Usual physical activity: walking, will begin Cardiac Rehab next week  Intervention:  Nutrition counseling provided.  Discussed diabetes disease process and treatment options.  Discussed physiology of diabetes and role of obesity on insulin resistance.  Encouraged moderate weight reduction to improve glucose levels.  Discussed role of medications and diet in glucose control  Provided education on macronutrients on glucose levels.  Provided education on carb counting, importance of regularly scheduled meals/snacks, and meal planning  Discussed effects of physical activity on glucose levels and long-term glucose control.  Recommended 150 minutes of physical activity/week.  Reviewed patient medications.  Discussed role of medication on blood glucose and possible side effects  Discussed blood glucose monitoring and interpretation.  Discussed recommended target ranges and individual ranges.    Described short-term complications: hyper- and hypo-glycemia.  Discussed causes,symptoms, and treatment options.  Discussed prevention, detection, and treatment of long-term complications.  Discussed the role of prolonged elevated  glucose levels on body systems.  Discussed role of stress on blood glucose levels and discussed strategies to manage psychosocial issues.  Discussed recommendations for long-term diabetes self-care.  Established checklist for medical, dental, and emotional self-care.  Plan:  Aim for 2-3 Carb Choices per meal (30-45 grams) +/- 1 either way  Aim for 0-15 Carbs per snack if hungry  Include protein in moderation with your meals and snacks Consider reading food labels for Total Carbohydrate and Fat Grams of foods Continue with exercise Continue checking BG at alternate times per day as directed by MD  Consider taking medication as directed by MD  Consider [facility] Protein Bars for snacks Consider Light and Fit Yogurt Always have protein and Carbs together  Teaching Method Utilized:  Visual Auditory Hands on  Handouts given during visit include: Living Well with Diabetes Carb Counting and Food Label handouts Meal Plan Card Snack hand out  Barriers to learning/adherence to lifestyle change: other health concerns  Diabetes self-care support plan:   [facility] support group  Cardiac Rehab  Demonstrated degree of understanding via:  Teach Back   Monitoring/Evaluation:  Dietary intake, exercise, test glucose, and body weight prn.

## 2013-08-05 ENCOUNTER — Encounter (HOSPITAL_COMMUNITY)
Admission: RE | Admit: 2013-08-05 | Discharge: 2013-08-05 | Disposition: A | Discharge status: Home / Self Care | Payer: BC Managed Care – PPO | Source: Ambulatory Visit | Attending: Cardiovascular Disease | Admitting: Cardiovascular Disease

## 2013-08-05 ENCOUNTER — Encounter (HOSPITAL_COMMUNITY): Payer: Self-pay

## 2013-08-05 VITALS — BP 112/62 | HR 81 | Ht 62.0 in | Wt 142.9 lb

## 2013-08-05 DIAGNOSIS — Z951 Presence of aortocoronary bypass graft: Secondary | ICD-10-CM | POA: Insufficient documentation

## 2013-08-05 DIAGNOSIS — I214 Non-ST elevation (NSTEMI) myocardial infarction: Secondary | ICD-10-CM | POA: Insufficient documentation

## 2013-08-05 DIAGNOSIS — I219 Acute myocardial infarction, unspecified: Secondary | ICD-10-CM

## 2013-08-05 DIAGNOSIS — I25810 Atherosclerosis of coronary artery bypass graft(s) without angina pectoris: Secondary | ICD-10-CM

## 2013-08-05 NOTE — Progress Notes (Signed)
Patient referred CR by Dr. Bryan Lemma due to recent CABGx4 and MI. During orientation advised patient on arrival and appointment times what to wear, what to do before, during and after exercise. Reviewed attendance and class policy. Talked about inclement weather and class consultation policy. Pt is scheduled to start Cardiac Rehab on 08/09/13 at 8:15 am. Pt was advised to come to class 5 minutes before class starts. He was also given instructions on meeting with the dietician and attending the Family Structure classes. Pt is eager to get started. Patient was able to complete the 6 minute walk test.

## 2013-08-05 NOTE — Patient Instructions (Signed)
Pt has finished orientation and is scheduled to start CR on 08/09/13 at 8:15am. Pt has been instructed to arrive to class 15 minutes early for scheduled class. Pt has been instructed to wear comfortable clothing and shoes with rubber soles. Pt has been told to take their medications 1 hour prior to coming to class.  If the patient is not going to attend class, he/she has been instructed to call.

## 2013-08-09 ENCOUNTER — Encounter (HOSPITAL_COMMUNITY)
Admission: RE | Admit: 2013-08-09 | Discharge: 2013-08-09 | Disposition: A | Discharge status: Home / Self Care | Payer: BC Managed Care – PPO | Source: Ambulatory Visit | Attending: Cardiovascular Disease | Admitting: Cardiovascular Disease

## 2013-08-11 ENCOUNTER — Encounter (HOSPITAL_COMMUNITY)
Admission: RE | Admit: 2013-08-11 | Discharge: 2013-08-11 | Disposition: A | Discharge status: Home / Self Care | Payer: BC Managed Care – PPO | Source: Ambulatory Visit | Attending: Cardiovascular Disease | Admitting: Cardiovascular Disease

## 2013-08-13 ENCOUNTER — Encounter (HOSPITAL_COMMUNITY)
Admission: RE | Admit: 2013-08-13 | Discharge: 2013-08-13 | Disposition: A | Discharge status: Home / Self Care | Payer: BC Managed Care – PPO | Source: Ambulatory Visit | Attending: Cardiovascular Disease | Admitting: Cardiovascular Disease

## 2013-08-13 DIAGNOSIS — Z951 Presence of aortocoronary bypass graft: Secondary | ICD-10-CM | POA: Insufficient documentation

## 2013-08-13 DIAGNOSIS — I214 Non-ST elevation (NSTEMI) myocardial infarction: Secondary | ICD-10-CM | POA: Insufficient documentation

## 2013-08-16 ENCOUNTER — Encounter (HOSPITAL_COMMUNITY): Payer: BC Managed Care – PPO

## 2013-08-18 ENCOUNTER — Encounter (HOSPITAL_COMMUNITY): Payer: BC Managed Care – PPO

## 2013-08-20 ENCOUNTER — Encounter (HOSPITAL_COMMUNITY)
Admission: RE | Admit: 2013-08-20 | Discharge: 2013-08-20 | Disposition: A | Discharge status: Home / Self Care | Payer: BC Managed Care – PPO | Source: Ambulatory Visit | Attending: Cardiovascular Disease | Admitting: Cardiovascular Disease

## 2013-08-23 ENCOUNTER — Encounter (HOSPITAL_COMMUNITY)
Admission: RE | Admit: 2013-08-23 | Discharge: 2013-08-23 | Disposition: A | Discharge status: Home / Self Care | Payer: BC Managed Care – PPO | Source: Ambulatory Visit | Attending: Cardiovascular Disease | Admitting: Cardiovascular Disease

## 2013-08-25 ENCOUNTER — Encounter (HOSPITAL_COMMUNITY)
Admission: RE | Admit: 2013-08-25 | Discharge: 2013-08-25 | Disposition: A | Discharge status: Home / Self Care | Payer: BC Managed Care – PPO | Source: Ambulatory Visit | Attending: Cardiovascular Disease | Admitting: Cardiovascular Disease

## 2013-08-26 ENCOUNTER — Encounter: Payer: Self-pay | Admitting: *Deleted

## 2013-08-26 ENCOUNTER — Ambulatory Visit (INDEPENDENT_AMBULATORY_CARE_PROVIDER_SITE_OTHER): Payer: BC Managed Care – PPO | Admitting: Cardiovascular Disease

## 2013-08-26 ENCOUNTER — Encounter: Payer: Self-pay | Admitting: Cardiovascular Disease

## 2013-08-26 ENCOUNTER — Ambulatory Visit: Payer: BC Managed Care – PPO | Admitting: Cardiovascular Disease

## 2013-08-26 VITALS — BP 139/85 | HR 80 | Ht 61.0 in | Wt 147.0 lb

## 2013-08-26 DIAGNOSIS — I2589 Other forms of chronic ischemic heart disease: Secondary | ICD-10-CM

## 2013-08-26 DIAGNOSIS — I359 Nonrheumatic aortic valve disorder, unspecified: Secondary | ICD-10-CM

## 2013-08-26 DIAGNOSIS — IMO0002 Reserved for concepts with insufficient information to code with codable children: Secondary | ICD-10-CM

## 2013-08-26 DIAGNOSIS — I2510 Atherosclerotic heart disease of native coronary artery without angina pectoris: Secondary | ICD-10-CM

## 2013-08-26 DIAGNOSIS — Z951 Presence of aortocoronary bypass graft: Secondary | ICD-10-CM

## 2013-08-26 DIAGNOSIS — IMO0001 Reserved for inherently not codable concepts without codable children: Secondary | ICD-10-CM

## 2013-08-26 DIAGNOSIS — E1165 Type 2 diabetes mellitus with hyperglycemia: Secondary | ICD-10-CM

## 2013-08-26 DIAGNOSIS — I10 Essential (primary) hypertension: Secondary | ICD-10-CM

## 2013-08-26 DIAGNOSIS — I5022 Chronic systolic (congestive) heart failure: Secondary | ICD-10-CM

## 2013-08-26 DIAGNOSIS — I350 Nonrheumatic aortic (valve) stenosis: Secondary | ICD-10-CM

## 2013-08-26 DIAGNOSIS — E785 Hyperlipidemia, unspecified: Secondary | ICD-10-CM

## 2013-08-26 DIAGNOSIS — I255 Ischemic cardiomyopathy: Secondary | ICD-10-CM

## 2013-08-26 NOTE — Progress Notes (Signed)
Patient ID: Isabel [name], female   DOB: 1965-08-22, 48 y.o.   MRN: [id number]      SUBJECTIVE: The patient is here for routine cardiovascular followup after undergoing 4-v CABG earlier this year. She has a history of coronary artery disease with prior stenting in 2004. She also has hypertension, hyperlipidemia, poorly controlled diabetes mellitus, ischemic cardiomyopathy (EF 25-30%), and at least mild aortic stenosis (low output, low gradient). Additional echocardiographic findings showed restrictive diastolic dysfunction, mild aortic regurgitation, mild mitral regurgitation, severe left atrial enlargement, mild right ventricular enlargement, moderate right atrial enlargement, and a small to medium size pericardial effusion.   She is feeling well and denies chest pain, palpitations, and shortness of breath. She has a "acid reflux sensation" due to one of the medications. She has some residual right leg pain at the site of the incision. She is 3 weeks into cardiac rehabilitation and would like to complete the entire session.  No Known Allergies  Current Outpatient Prescriptions  Medication Sig Dispense Refill  . ALPRAZolam (XANAX) 1 MG tablet Take 1 mg by mouth 4 (four) times daily as needed.      Marland Kitchen amiodarone (PACERONE) 200 MG tablet Take 1 tablet (200 mg total) by mouth daily.  30 tablet  1  . atorvastatin (LIPITOR) 80 MG tablet Take 1 tablet (80 mg total) by mouth daily at 6 PM.  30 tablet  1  . carvedilol (COREG) 6.25 MG tablet Take 1 tablet (6.25 mg total) by mouth 2 (two) times daily with a meal.  60 tablet  1  . furosemide (LASIX) 40 MG tablet Take 1 tablet (40 mg total) by mouth daily.  30 tablet  1  . Insulin Detemir (LEVEMIR) 100 UNIT/ML Pen Inject 19 Units into the skin daily.      . Insulin Pen Needle 29G X MISC 1 box  1 each  2  . lisinopril (PRINIVIL,ZESTRIL) 5 MG tablet Take 1 tablet (5 mg total) by mouth daily.  30 tablet  1  . metFORMIN (GLUCOPHAGE-XR) 500 MG 24 hr tablet  Take 500 mg by mouth daily with breakfast.       . polyethylene glycol (MIRALAX / GLYCOLAX) packet Take 17 g by mouth daily.  14 each  1  . potassium chloride 20 MEQ TBCR Take 20 mEq by mouth daily.  30 tablet  1  . traMADol (ULTRAM) 50 MG tablet Take 1-2 tablets (50-100 mg total) by mouth every 4 (four) hours as needed for moderate pain.  30 tablet  1   No current facility-administered medications for this visit.    Past Medical History  Diagnosis Date  . Coronary artery disease   . Diabetes mellitus without complication   . Renal disorder     kidney stones  . CHF (congestive heart failure)     Past Surgical History  Procedure Laterality Date  . Cardiac stents    . Back surgery    . Kidney stone surgery    . Fallopian tube and 1 ovary removed    . Tonsillectomy    . Coronary artery bypass graft N/A 06/08/2013    Procedure: CORONARY ARTERY BYPASS GRAFTING (CABG) times four using left internal mammary and right saphenous vein.;  Surgeon: Delight Ovens, MD;  Location: [facility] OR;  Service: Open Heart Surgery;  Laterality: N/A;  . Intraoperative transesophageal echocardiogram N/A 06/08/2013    Procedure: INTRAOPERATIVE TRANSESOPHAGEAL ECHOCARDIOGRAM;  Surgeon: Delight Ovens, MD;  Location: [facility] OR;  Service: Open Heart Surgery;  Laterality: N/A;    History   Social History  . Marital Status: Married    Spouse Name: N/A    Number of Children: N/A  . Years of Education: N/A   Occupational History  . Not on file.   Social History Main Topics  . Smoking status: Former Smoker -- 0.50 packs/day for 10 years    Types: Cigarettes    Quit date: 04/15/2000  . Smokeless tobacco: Never Used     Comment: QUIT SMOKING YEARS AGO "  . Alcohol Use: Yes     Comment: occ  . Drug Use: No  . Sexual Activity: Not on file   Other Topics Concern  . Not on file   Social History Narrative  . No narrative on file     Filed Vitals:   08/26/13 1043  Height: 5\' 1"  (1.549 m)  Weight: 147  lb (66.679 kg)   BP 139/85 Pulse 80    PHYSICAL EXAM General: NAD Neck: No JVD, no thyromegaly. Lungs: Clear to auscultation bilaterally with normal respiratory effort. CV: Nondisplaced PMI.  Well-healed midline sternotomy. Regular rate and rhythm, normal S1/S2, no S3/S4, no murmur. No pretibial or periankle edema.  No carotid bruit.  Normal pedal pulses.  Abdomen: Soft, nontender, no hepatosplenomegaly, no distention.  Neurologic: Alert and oriented x 3.  Psych: Normal affect. Extremities: No clubbing or cyanosis.   ECG: reviewed and available in electronic records.      ASSESSMENT AND PLAN: 1. CAD s/p 4-v CABG: Symptomatically stable. Denies symptoms of chest pain and shortness of breath. Continue ASA, Lipitor, Coreg, and lisinopril. Enrolled in cardiac rehabilitation (3 weeks completed). I will discontinue amiodarone. 2. HTN: Controlled on current therapy.  3. Hyperlipidemia: Continue high-dose Lipitor.  4. Ischemic cardiomyopathy/chronic systolic heart failure: Appears euvolemic. Sodium-restricted diet emphasized. She also knows to weigh herself daily. LV systolic function will need to be reevaluated in the future, to see if she is a candidate for AICD.  5. Diabetes mellitus: Management per Dr. Dimas AguasHoward (PCP).   Dispo: f/u 3 months.   Prentice DockerSuresh Gila Lauf, M.D., F.A.C.C.

## 2013-08-26 NOTE — Patient Instructions (Signed)
   Stop Amiodarone Continue all other medications.   Work note provided for patient.  Follow up in  3 months

## 2013-08-27 ENCOUNTER — Encounter (HOSPITAL_COMMUNITY): Payer: BC Managed Care – PPO

## 2013-08-30 ENCOUNTER — Encounter (HOSPITAL_COMMUNITY)
Admission: RE | Admit: 2013-08-30 | Discharge: 2013-08-30 | Disposition: A | Discharge status: Home / Self Care | Payer: BC Managed Care – PPO | Source: Ambulatory Visit | Attending: Cardiovascular Disease | Admitting: Cardiovascular Disease

## 2013-09-01 ENCOUNTER — Encounter (HOSPITAL_COMMUNITY)
Admission: RE | Admit: 2013-09-01 | Discharge: 2013-09-01 | Disposition: A | Discharge status: Home / Self Care | Payer: BC Managed Care – PPO | Source: Ambulatory Visit | Attending: Cardiovascular Disease | Admitting: Cardiovascular Disease

## 2013-09-03 ENCOUNTER — Encounter (HOSPITAL_COMMUNITY)
Admission: RE | Admit: 2013-09-03 | Discharge: 2013-09-03 | Disposition: A | Discharge status: Home / Self Care | Payer: BC Managed Care – PPO | Source: Ambulatory Visit | Attending: Cardiovascular Disease | Admitting: Cardiovascular Disease

## 2013-09-06 ENCOUNTER — Encounter (HOSPITAL_COMMUNITY): Payer: BC Managed Care – PPO

## 2013-09-08 ENCOUNTER — Encounter (HOSPITAL_COMMUNITY): Payer: BC Managed Care – PPO

## 2013-09-10 ENCOUNTER — Encounter (HOSPITAL_COMMUNITY)
Admission: RE | Admit: 2013-09-10 | Discharge: 2013-09-10 | Disposition: A | Discharge status: Home / Self Care | Payer: BC Managed Care – PPO | Source: Ambulatory Visit | Attending: Cardiovascular Disease | Admitting: Cardiovascular Disease

## 2013-09-13 ENCOUNTER — Encounter (HOSPITAL_COMMUNITY): Payer: BC Managed Care – PPO

## 2013-09-15 ENCOUNTER — Encounter (HOSPITAL_COMMUNITY): Payer: BC Managed Care – PPO

## 2013-09-17 ENCOUNTER — Encounter (HOSPITAL_COMMUNITY)
Admission: RE | Admit: 2013-09-17 | Discharge: 2013-09-17 | Disposition: A | Discharge status: Home / Self Care | Payer: BC Managed Care – PPO | Source: Ambulatory Visit | Attending: Cardiovascular Disease | Admitting: Cardiovascular Disease

## 2013-09-17 DIAGNOSIS — Z951 Presence of aortocoronary bypass graft: Secondary | ICD-10-CM | POA: Insufficient documentation

## 2013-09-17 DIAGNOSIS — I214 Non-ST elevation (NSTEMI) myocardial infarction: Secondary | ICD-10-CM | POA: Insufficient documentation

## 2013-09-20 ENCOUNTER — Encounter (HOSPITAL_COMMUNITY)
Admission: RE | Admit: 2013-09-20 | Discharge: 2013-09-20 | Disposition: A | Discharge status: Home / Self Care | Payer: BC Managed Care – PPO | Source: Ambulatory Visit | Attending: Cardiovascular Disease | Admitting: Cardiovascular Disease

## 2013-09-22 ENCOUNTER — Encounter (HOSPITAL_COMMUNITY): Payer: BC Managed Care – PPO

## 2013-09-24 ENCOUNTER — Encounter (HOSPITAL_COMMUNITY): Payer: BC Managed Care – PPO

## 2013-09-27 ENCOUNTER — Encounter (HOSPITAL_COMMUNITY)
Admission: RE | Admit: 2013-09-27 | Discharge: 2013-09-27 | Disposition: A | Discharge status: Home / Self Care | Payer: BC Managed Care – PPO | Source: Ambulatory Visit | Attending: Cardiovascular Disease | Admitting: Cardiovascular Disease

## 2013-09-29 ENCOUNTER — Encounter (HOSPITAL_COMMUNITY)
Admission: RE | Admit: 2013-09-29 | Discharge: 2013-09-29 | Disposition: A | Discharge status: Home / Self Care | Payer: BC Managed Care – PPO | Source: Ambulatory Visit | Attending: Cardiovascular Disease | Admitting: Cardiovascular Disease

## 2013-10-01 ENCOUNTER — Encounter (HOSPITAL_COMMUNITY)
Admission: RE | Admit: 2013-10-01 | Discharge: 2013-10-01 | Disposition: A | Discharge status: Home / Self Care | Payer: BC Managed Care – PPO | Source: Ambulatory Visit | Attending: Cardiovascular Disease | Admitting: Cardiovascular Disease

## 2013-10-04 ENCOUNTER — Encounter (HOSPITAL_COMMUNITY)
Admission: RE | Admit: 2013-10-04 | Discharge: 2013-10-04 | Disposition: A | Discharge status: Home / Self Care | Payer: BC Managed Care – PPO | Source: Ambulatory Visit | Attending: Cardiovascular Disease | Admitting: Cardiovascular Disease

## 2013-10-06 ENCOUNTER — Encounter (HOSPITAL_COMMUNITY)
Admission: RE | Admit: 2013-10-06 | Discharge: 2013-10-06 | Disposition: A | Discharge status: Home / Self Care | Payer: BC Managed Care – PPO | Source: Ambulatory Visit | Attending: Cardiovascular Disease | Admitting: Cardiovascular Disease

## 2013-10-08 ENCOUNTER — Encounter (HOSPITAL_COMMUNITY)
Admission: RE | Admit: 2013-10-08 | Discharge: 2013-10-08 | Disposition: A | Discharge status: Home / Self Care | Payer: BC Managed Care – PPO | Source: Ambulatory Visit | Attending: Cardiovascular Disease | Admitting: Cardiovascular Disease

## 2013-10-11 ENCOUNTER — Encounter (HOSPITAL_COMMUNITY): Payer: BC Managed Care – PPO

## 2013-10-13 ENCOUNTER — Encounter (HOSPITAL_COMMUNITY): Payer: BC Managed Care – PPO

## 2013-10-15 ENCOUNTER — Encounter (HOSPITAL_COMMUNITY): Payer: BC Managed Care – PPO

## 2013-10-18 ENCOUNTER — Encounter (HOSPITAL_COMMUNITY)
Admission: RE | Admit: 2013-10-18 | Discharge: 2013-10-18 | Disposition: A | Discharge status: Home / Self Care | Payer: BC Managed Care – PPO | Source: Ambulatory Visit | Attending: Cardiovascular Disease | Admitting: Cardiovascular Disease

## 2013-10-18 DIAGNOSIS — I214 Non-ST elevation (NSTEMI) myocardial infarction: Secondary | ICD-10-CM | POA: Insufficient documentation

## 2013-10-18 DIAGNOSIS — Z951 Presence of aortocoronary bypass graft: Secondary | ICD-10-CM | POA: Insufficient documentation

## 2013-10-20 ENCOUNTER — Encounter (HOSPITAL_COMMUNITY)
Admission: RE | Admit: 2013-10-20 | Discharge: 2013-10-20 | Disposition: A | Discharge status: Home / Self Care | Payer: BC Managed Care – PPO | Source: Ambulatory Visit | Attending: Cardiovascular Disease | Admitting: Cardiovascular Disease

## 2013-10-22 ENCOUNTER — Encounter (HOSPITAL_COMMUNITY)
Admission: RE | Admit: 2013-10-22 | Discharge: 2013-10-22 | Disposition: A | Discharge status: Home / Self Care | Payer: BC Managed Care – PPO | Source: Ambulatory Visit | Attending: Cardiovascular Disease | Admitting: Cardiovascular Disease

## 2013-10-25 ENCOUNTER — Encounter (HOSPITAL_COMMUNITY)
Admission: RE | Admit: 2013-10-25 | Discharge: 2013-10-25 | Disposition: A | Discharge status: Home / Self Care | Payer: BC Managed Care – PPO | Source: Ambulatory Visit | Attending: Cardiovascular Disease | Admitting: Cardiovascular Disease

## 2013-10-27 ENCOUNTER — Encounter (HOSPITAL_COMMUNITY)
Admission: RE | Admit: 2013-10-27 | Discharge: 2013-10-27 | Disposition: A | Discharge status: Home / Self Care | Payer: BC Managed Care – PPO | Source: Ambulatory Visit | Attending: Cardiovascular Disease | Admitting: Cardiovascular Disease

## 2013-10-29 ENCOUNTER — Encounter (HOSPITAL_COMMUNITY)
Admission: RE | Admit: 2013-10-29 | Discharge: 2013-10-29 | Disposition: A | Discharge status: Home / Self Care | Payer: BC Managed Care – PPO | Source: Ambulatory Visit | Attending: Cardiovascular Disease | Admitting: Cardiovascular Disease

## 2013-11-01 ENCOUNTER — Encounter (HOSPITAL_COMMUNITY)
Admission: RE | Admit: 2013-11-01 | Discharge: 2013-11-01 | Disposition: A | Discharge status: Home / Self Care | Payer: BC Managed Care – PPO | Source: Ambulatory Visit | Attending: Cardiovascular Disease | Admitting: Cardiovascular Disease

## 2013-11-02 DIAGNOSIS — Z0279 Encounter for issue of other medical certificate: Secondary | ICD-10-CM

## 2013-11-03 ENCOUNTER — Encounter (HOSPITAL_COMMUNITY)
Admission: RE | Admit: 2013-11-03 | Discharge: 2013-11-03 | Disposition: A | Discharge status: Home / Self Care | Payer: BC Managed Care – PPO | Source: Ambulatory Visit | Attending: Cardiovascular Disease | Admitting: Cardiovascular Disease

## 2013-11-05 ENCOUNTER — Encounter (HOSPITAL_COMMUNITY)
Admission: RE | Admit: 2013-11-05 | Discharge: 2013-11-05 | Disposition: A | Discharge status: Home / Self Care | Payer: BC Managed Care – PPO | Source: Ambulatory Visit | Attending: Cardiovascular Disease | Admitting: Cardiovascular Disease

## 2013-11-08 ENCOUNTER — Encounter (HOSPITAL_COMMUNITY)
Admission: RE | Admit: 2013-11-08 | Discharge: 2013-11-08 | Disposition: A | Discharge status: Home / Self Care | Payer: BC Managed Care – PPO | Source: Ambulatory Visit | Attending: Cardiovascular Disease | Admitting: Cardiovascular Disease

## 2013-11-10 ENCOUNTER — Encounter (HOSPITAL_COMMUNITY)
Admission: RE | Admit: 2013-11-10 | Discharge: 2013-11-10 | Disposition: A | Discharge status: Home / Self Care | Payer: BC Managed Care – PPO | Source: Ambulatory Visit | Attending: Cardiovascular Disease | Admitting: Cardiovascular Disease

## 2013-11-12 ENCOUNTER — Encounter (HOSPITAL_COMMUNITY)
Admission: RE | Admit: 2013-11-12 | Discharge: 2013-11-12 | Disposition: A | Discharge status: Home / Self Care | Payer: BC Managed Care – PPO | Source: Ambulatory Visit | Attending: Cardiovascular Disease | Admitting: Cardiovascular Disease

## 2013-11-15 ENCOUNTER — Encounter (HOSPITAL_COMMUNITY)
Admission: RE | Admit: 2013-11-15 | Discharge: 2013-11-15 | Disposition: A | Discharge status: Home / Self Care | Payer: BC Managed Care – PPO | Source: Ambulatory Visit | Attending: Cardiovascular Disease | Admitting: Cardiovascular Disease

## 2013-11-15 DIAGNOSIS — Z951 Presence of aortocoronary bypass graft: Secondary | ICD-10-CM | POA: Diagnosis not present

## 2013-11-15 DIAGNOSIS — I214 Non-ST elevation (NSTEMI) myocardial infarction: Secondary | ICD-10-CM | POA: Diagnosis not present

## 2013-11-17 ENCOUNTER — Encounter (HOSPITAL_COMMUNITY)
Admission: RE | Admit: 2013-11-17 | Discharge: 2013-11-17 | Disposition: A | Discharge status: Home / Self Care | Payer: BC Managed Care – PPO | Source: Ambulatory Visit | Attending: Cardiovascular Disease | Admitting: Cardiovascular Disease

## 2013-11-17 DIAGNOSIS — I214 Non-ST elevation (NSTEMI) myocardial infarction: Secondary | ICD-10-CM | POA: Diagnosis not present

## 2013-11-19 ENCOUNTER — Encounter (HOSPITAL_COMMUNITY)
Admission: RE | Admit: 2013-11-19 | Discharge: 2013-11-19 | Disposition: A | Discharge status: Home / Self Care | Payer: BC Managed Care – PPO | Source: Ambulatory Visit | Attending: Cardiovascular Disease | Admitting: Cardiovascular Disease

## 2013-11-19 DIAGNOSIS — I214 Non-ST elevation (NSTEMI) myocardial infarction: Secondary | ICD-10-CM | POA: Diagnosis not present

## 2013-11-22 ENCOUNTER — Encounter (HOSPITAL_COMMUNITY): Payer: BC Managed Care – PPO

## 2013-11-24 ENCOUNTER — Encounter (HOSPITAL_COMMUNITY)
Admission: RE | Admit: 2013-11-24 | Discharge: 2013-11-24 | Disposition: A | Discharge status: Home / Self Care | Payer: BC Managed Care – PPO | Source: Ambulatory Visit | Attending: Cardiovascular Disease | Admitting: Cardiovascular Disease

## 2013-11-24 DIAGNOSIS — I214 Non-ST elevation (NSTEMI) myocardial infarction: Secondary | ICD-10-CM | POA: Diagnosis not present

## 2013-11-26 ENCOUNTER — Encounter (HOSPITAL_COMMUNITY)
Admission: RE | Admit: 2013-11-26 | Discharge: 2013-11-26 | Disposition: A | Discharge status: Home / Self Care | Payer: BC Managed Care – PPO | Source: Ambulatory Visit | Attending: Cardiovascular Disease | Admitting: Cardiovascular Disease

## 2013-11-26 DIAGNOSIS — I214 Non-ST elevation (NSTEMI) myocardial infarction: Secondary | ICD-10-CM | POA: Diagnosis not present

## 2013-11-29 ENCOUNTER — Encounter (HOSPITAL_COMMUNITY)
Admission: RE | Admit: 2013-11-29 | Discharge: 2013-11-29 | Disposition: A | Discharge status: Home / Self Care | Payer: BC Managed Care – PPO | Source: Ambulatory Visit | Attending: Cardiovascular Disease | Admitting: Cardiovascular Disease

## 2013-11-29 ENCOUNTER — Encounter: Payer: Self-pay | Admitting: Cardiology

## 2013-11-29 DIAGNOSIS — I214 Non-ST elevation (NSTEMI) myocardial infarction: Secondary | ICD-10-CM | POA: Diagnosis not present

## 2013-11-30 ENCOUNTER — Encounter: Payer: Self-pay | Admitting: Cardiovascular Disease

## 2013-11-30 ENCOUNTER — Ambulatory Visit (INDEPENDENT_AMBULATORY_CARE_PROVIDER_SITE_OTHER): Payer: BC Managed Care – PPO | Admitting: Cardiovascular Disease

## 2013-11-30 ENCOUNTER — Encounter: Payer: Self-pay | Admitting: *Deleted

## 2013-11-30 VITALS — BP 105/68 | HR 88 | Ht 61.0 in | Wt 157.0 lb

## 2013-11-30 DIAGNOSIS — I2589 Other forms of chronic ischemic heart disease: Secondary | ICD-10-CM

## 2013-11-30 DIAGNOSIS — I10 Essential (primary) hypertension: Secondary | ICD-10-CM

## 2013-11-30 DIAGNOSIS — IMO0001 Reserved for inherently not codable concepts without codable children: Secondary | ICD-10-CM

## 2013-11-30 DIAGNOSIS — I255 Ischemic cardiomyopathy: Secondary | ICD-10-CM

## 2013-11-30 DIAGNOSIS — I5022 Chronic systolic (congestive) heart failure: Secondary | ICD-10-CM

## 2013-11-30 DIAGNOSIS — Z951 Presence of aortocoronary bypass graft: Secondary | ICD-10-CM

## 2013-11-30 DIAGNOSIS — E1165 Type 2 diabetes mellitus with hyperglycemia: Secondary | ICD-10-CM

## 2013-11-30 DIAGNOSIS — I25812 Atherosclerosis of bypass graft of coronary artery of transplanted heart without angina pectoris: Secondary | ICD-10-CM

## 2013-11-30 DIAGNOSIS — E785 Hyperlipidemia, unspecified: Secondary | ICD-10-CM

## 2013-11-30 DIAGNOSIS — IMO0002 Reserved for concepts with insufficient information to code with codable children: Secondary | ICD-10-CM

## 2013-11-30 MED ORDER — CARVEDILOL 3.125 MG PO TABS: 3.1250 mg | ORAL_TABLET | Freq: Two times a day (BID) | ORAL | Status: DC

## 2013-11-30 NOTE — Progress Notes (Signed)
Patient ID: Isabel GaskinsSusan J [name], female   DOB: Jul 01, 1965, 48 y.o.   MRN: [id number]      SUBJECTIVE: The patient is here for routine cardiovascular followup after undergoing 4-v CABG earlier this year. She has a history of coronary artery disease with prior stenting in 2004. She also has hypertension, hyperlipidemia, poorly controlled diabetes mellitus (now on insulin), ischemic cardiomyopathy (EF 25-30%), and at least mild aortic stenosis (low output, low gradient). Additional echocardiographic findings showed restrictive diastolic dysfunction, mild aortic regurgitation, mild mitral regurgitation, severe left atrial enlargement, mild right ventricular enlargement, moderate right atrial enlargement, and a small to medium size pericardial effusion.  She is feeling very well today. She completed cardiac rehabilitation today. She denies chest pain, shortness of breath, leg swelling, palpitations, orthopnea and paroxysmal nocturnal dyspnea. She developed some weight gain with insulin. She did not tolerate metformin due to diarrhea. She would like to return to work. She works at [employer]. She has mistakenly continued to take amiodarone which I discontinued at her last visit. Her primary care physician reduced her carvedilol to 6.25 mg once a day due to blood pressures being 80/50 accompanied by hypotension and dizziness. This has since resolved.   Review of Systems: As per "subjective", otherwise negative.  No Known Allergies  Current Outpatient Prescriptions  Medication Sig Dispense Refill  . ALPRAZolam (XANAX) 1 MG tablet Take 1 mg by mouth 4 (four) times daily as needed.      Marland Kitchen. amiodarone (PACERONE) 200 MG tablet Take 1 tablet by mouth daily.      Marland Kitchen. aspirin EC 81 MG tablet Take 81 mg by mouth daily.      Marland Kitchen. atorvastatin (LIPITOR) 80 MG tablet Take 1 tablet (80 mg total) by mouth daily at 6 PM.  30 tablet  1  . carvedilol (COREG) 6.25 MG tablet Take 6.25 mg by mouth daily.      . furosemide  (LASIX) 40 MG tablet Take 1 tablet (40 mg total) by mouth daily.  30 tablet  1  . HUMALOG KWIKPEN 100 UNIT/ML KiwkPen Inject 20 Units into the skin 3 (three) times daily.      . Insulin Detemir (LEVEMIR) 100 UNIT/ML Pen Inject 40 Units into the skin every morning. &20 at lunch & 40 units at night      . insulin lispro (HUMALOG) 100 UNIT/ML injection Inject into the skin 3 (three) times daily before meals.      . Insulin Pen Needle 29G X 5MM MISC 1 box  1 each  2  . lisinopril (PRINIVIL,ZESTRIL) 5 MG tablet Take 1 tablet (5 mg total) by mouth daily.  30 tablet  1  . omeprazole (PRILOSEC) 20 MG capsule Take 1 capsule by mouth 2 (two) times daily.      . polyethylene glycol (MIRALAX / GLYCOLAX) packet Take 17 g by mouth daily as needed.      . potassium chloride 20 MEQ TBCR Take 20 mEq by mouth daily.  30 tablet  1  . traMADol (ULTRAM) 50 MG tablet Take 1-2 tablets (50-100 mg total) by mouth every 4 (four) hours as needed for moderate pain.  30 tablet  1   No current facility-administered medications for this visit.    Past Medical History  Diagnosis Date  . Coronary artery disease   . Diabetes mellitus without complication   . Renal disorder     kidney stones  . CHF (congestive heart failure)     Past Surgical History  Procedure Laterality Date  .  Cardiac stents    . Back surgery    . Kidney stone surgery    . Fallopian tube and 1 ovary removed    . Tonsillectomy    . Coronary artery bypass graft N/A 06/08/2013    Procedure: CORONARY ARTERY BYPASS GRAFTING (CABG) times four using left internal mammary and right saphenous vein.;  Surgeon: Delight Ovens, MD;  Location: [facility] OR;  Service: Open Heart Surgery;  Laterality: N/A;  . Intraoperative transesophageal echocardiogram N/A 06/08/2013    Procedure: INTRAOPERATIVE TRANSESOPHAGEAL ECHOCARDIOGRAM;  Surgeon: Delight Ovens, MD;  Location: [facility] OR;  Service: Open Heart Surgery;  Laterality: N/A;    History   Social History  .  Marital Status: Married    Spouse Name: N/A    Number of Children: N/A  . Years of Education: N/A   Occupational History  . Not on file.   Social History Main Topics  . Smoking status: Former Smoker -- 0.50 packs/day for 10 years    Types: Cigarettes    Quit date: 04/15/2000  . Smokeless tobacco: Never Used     Comment: QUIT SMOKING YEARS AGO "  . Alcohol Use: Yes     Comment: occ  . Drug Use: No  . Sexual Activity: Not on file   Other Topics Concern  . Not on file   Social History Narrative  . No narrative on file     Filed Vitals:   11/30/13 0944  BP: 105/68  Pulse: 88  Height: 5\' 1"  (1.549 m)  Weight: 157 lb (71.215 kg)  SpO2: 100%    PHYSICAL EXAM General: NAD  Neck: No JVD, no thyromegaly.  Lungs: Clear to auscultation bilaterally with normal respiratory effort.  CV: Nondisplaced PMI. Well-healed midline sternotomy. Regular rate and rhythm, normal S1/S2, no S3/S4, no murmur. No pretibial or periankle edema. No carotid bruit. Normal pedal pulses.  Abdomen: Soft, nontender, no hepatosplenomegaly, no distention.  Neurologic: Alert and oriented x 3.  Psych: Normal affect.  Extremities: No clubbing or cyanosis.   ECG: reviewed and available in electronic records.    ASSESSMENT AND PLAN: 1. CAD s/p 4-v CABG: Symptomatically stable. Denies symptoms of chest pain and shortness of breath. Continue ASA, Lipitor, Coreg (switch to 3.125 mg bid), and lisinopril. Has completed cardiac rehabilitation. 2. Essential HTN: Controlled on current therapy.  3. Hyperlipidemia: Continue high-dose Lipitor.  4. Ischemic cardiomyopathy/chronic systolic heart failure: Appears euvolemic. Sodium-restricted diet previously emphasized. She knows to weigh herself daily. LV systolic function will need to be reevaluated in the future, to see if she is a candidate for AICD.  5. Insulin-dependent diabetes mellitus: Management per Dr. Dimas Aguas (PCP). Now on insulin, no longer on  metformin.  Dispo: f/u 3 months.  Prentice Docker, M.D., F.A.C.C.

## 2013-11-30 NOTE — Patient Instructions (Signed)
   Stop Amiodarone  Change Coreg to 3.125mg  twice a day  - new sent to pharm Continue all other medications.   Out of work note provided today  Follow up in 3 months

## 2013-11-30 NOTE — Progress Notes (Signed)
Patient graduated from [place] today on 11/29/13 after completing 36 sessions. He achieved LTG of 30 minutes of aerobic exercise at Max Met level of 3.74. All patients vitals are WNL. Patient has met with dietician. Discharge instruction has been reviewed in detail and patient stated an understanding of material given. Patient plans to exercise at home on his Treadmill. Cardiac Rehab staff will make f/u calls at 1 month, 6 months, and 1 year. Patient had no complaints of any abnormal S/S or pain on their exit visit. Patient was able to do the 6 minute walk test w/o any abnormal S/S.

## 2013-11-30 NOTE — Addendum Note (Signed)
Addended by: Lesle Chris on: 11/30/2013 10:44 AM   Modules accepted: Orders, Medications

## 2013-12-01 ENCOUNTER — Ambulatory Visit: Payer: BC Managed Care – PPO | Admitting: Cardiovascular Disease

## 2013-12-01 ENCOUNTER — Encounter (HOSPITAL_COMMUNITY): Payer: BC Managed Care – PPO

## 2013-12-03 ENCOUNTER — Encounter (HOSPITAL_COMMUNITY): Payer: BC Managed Care – PPO

## 2013-12-06 ENCOUNTER — Encounter (HOSPITAL_COMMUNITY): Payer: BC Managed Care – PPO

## 2014-02-25 ENCOUNTER — Encounter: Payer: Self-pay | Admitting: Cardiovascular Disease

## 2014-02-25 ENCOUNTER — Ambulatory Visit (INDEPENDENT_AMBULATORY_CARE_PROVIDER_SITE_OTHER): Payer: BC Managed Care – PPO | Admitting: Cardiovascular Disease

## 2014-02-25 VITALS — BP 110/70 | HR 92 | Ht 62.0 in | Wt 156.0 lb

## 2014-02-25 DIAGNOSIS — I5022 Chronic systolic (congestive) heart failure: Secondary | ICD-10-CM

## 2014-02-25 DIAGNOSIS — I3139 Other pericardial effusion (noninflammatory): Secondary | ICD-10-CM

## 2014-02-25 DIAGNOSIS — I25812 Atherosclerosis of bypass graft of coronary artery of transplanted heart without angina pectoris: Secondary | ICD-10-CM

## 2014-02-25 DIAGNOSIS — E785 Hyperlipidemia, unspecified: Secondary | ICD-10-CM

## 2014-02-25 DIAGNOSIS — I255 Ischemic cardiomyopathy: Secondary | ICD-10-CM

## 2014-02-25 DIAGNOSIS — I10 Essential (primary) hypertension: Secondary | ICD-10-CM

## 2014-02-25 DIAGNOSIS — E1165 Type 2 diabetes mellitus with hyperglycemia: Secondary | ICD-10-CM

## 2014-02-25 DIAGNOSIS — I313 Pericardial effusion (noninflammatory): Secondary | ICD-10-CM

## 2014-02-25 DIAGNOSIS — I319 Disease of pericardium, unspecified: Secondary | ICD-10-CM

## 2014-02-25 DIAGNOSIS — I350 Nonrheumatic aortic (valve) stenosis: Secondary | ICD-10-CM

## 2014-02-25 DIAGNOSIS — I519 Heart disease, unspecified: Secondary | ICD-10-CM

## 2014-02-25 DIAGNOSIS — IMO0002 Reserved for concepts with insufficient information to code with codable children: Secondary | ICD-10-CM

## 2014-02-25 NOTE — Addendum Note (Signed)
Addended by: Lesle Chris on: 02/25/2014 10:26 AM   Modules accepted: Orders, Level of Service

## 2014-02-25 NOTE — Patient Instructions (Signed)
Your physician has requested that you have an echocardiogram. Echocardiography is a painless test that uses sound waves to create images of your heart. It provides your doctor with information about the size and shape of your heart and how well your heart's chambers and valves are working. This procedure takes approximately one hour. There are no restrictions for this procedure. Office will contact with results via phone or letter.   Your physician wants you to follow up in: 6 months.  You will receive a reminder letter in the mail one-two months in advance.  If you don't receive a letter, please call our office to schedule the follow up appointment    

## 2014-02-25 NOTE — Progress Notes (Signed)
Patient ID: Isabel GaskinsSusan J [name], female   DOB: 1966-01-21, 48 y.o.   MRN: [id number]      SUBJECTIVE: The patient is here for routine cardiovascular followup after undergoing 4-v CABG earlier this year. She has a history of coronary artery disease with prior stenting in 2004. She also has hypertension, hyperlipidemia, insulin-dependent diabetes mellitus, ischemic cardiomyopathy (EF 25-30%), and at least mild aortic stenosis (low output, low gradient). Additional echocardiographic findings showed restrictive diastolic dysfunction, mild aortic regurgitation, mild mitral regurgitation, severe left atrial enlargement, mild right ventricular enlargement, moderate right atrial enlargement, and a small to medium size pericardial effusion.   She denies exertional chest pain and dyspnea. She is able to use the bike at work. 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 She only has chest discomfort on occasions when she wears her seatbelt as it causes pressure over her sternotomy. She denies orthopnea, palpitations, paroxysmal nocturnal dyspnea, and leg swelling. She said her diabetes is better controlled than it had been over the summer.   Review of Systems: As per "subjective", otherwise negative.  No Known Allergies  Current Outpatient Prescriptions  Medication Sig Dispense Refill  . ALPRAZolam (XANAX) 1 MG tablet Take 1 mg by mouth 4 (four) times daily as needed.    Marland Kitchen. aspirin EC 81 MG tablet Take 81 mg by mouth daily.    Marland Kitchen. atorvastatin (LIPITOR) 80 MG tablet Take 1 tablet (80 mg total) by mouth daily at 6 PM. 30 tablet 1  . carvedilol (COREG) 3.125 MG tablet Take 1 tablet (3.125 mg total) by mouth 2 (two) times daily. 60 tablet 6  . furosemide (LASIX) 40 MG tablet Take 1 tablet (40 mg total) by mouth daily. 30 tablet 1  . HUMALOG KWIKPEN 100 UNIT/ML KiwkPen Inject 20 Units into the skin 3 (three) times daily.    . Insulin Detemir (LEVEMIR) 100 UNIT/ML Pen Inject 40 Units into the skin every morning.  &20 at lunch & 40 units at night    . insulin lispro (HUMALOG) 100 UNIT/ML injection Inject into the skin 3 (three) times daily before meals.    . Insulin Pen Needle 29G X 5MM MISC 1 box 1 each 2  . lisinopril (PRINIVIL,ZESTRIL) 5 MG tablet Take 1 tablet (5 mg total) by mouth daily. 30 tablet 1  . omeprazole (PRILOSEC) 20 MG capsule Take 1 capsule by mouth 2 (two) times daily.    . polyethylene glycol (MIRALAX / GLYCOLAX) packet Take 17 g by mouth daily as needed.    . potassium chloride 20 MEQ TBCR Take 20 mEq by mouth daily. 30 tablet 1  . traMADol (ULTRAM) 50 MG tablet Take 1-2 tablets (50-100 mg total) by mouth every 4 (four) hours as needed for moderate pain. 30 tablet 1   No current facility-administered medications for this visit.    Past Medical History  Diagnosis Date  . Coronary artery disease   . Diabetes mellitus without complication   . Renal disorder     kidney stones  . CHF (congestive heart failure)     Past Surgical History  Procedure Laterality Date  . Cardiac stents    . Back surgery    . Kidney stone surgery    . Fallopian tube and 1 ovary removed    . Tonsillectomy    . Coronary artery bypass graft N/A 06/08/2013    Procedure: CORONARY ARTERY BYPASS GRAFTING (CABG) times four using left internal mammary and right saphenous vein.;  Surgeon: Delight OvensEdward B Gerhardt, MD;  Location: [facility] OR;  Service: Open Heart Surgery;  Laterality: N/A;  . Intraoperative transesophageal echocardiogram N/A 06/08/2013    Procedure: INTRAOPERATIVE TRANSESOPHAGEAL ECHOCARDIOGRAM;  Surgeon: Delight Ovens, MD;  Location: [facility] OR;  Service: Open Heart Surgery;  Laterality: N/A;    History   Social History  . Marital Status: Married    Spouse Name: N/A    Number of Children: N/A  . Years of Education: N/A   Occupational History  . Not on file.   Social History Main Topics  . Smoking status: Former Smoker -- 0.50 packs/day for 10 years    Types: Cigarettes    Start date: 12/26/1992      Quit date: 04/15/2000  . Smokeless tobacco: Never Used     Comment: QUIT SMOKING YEARS AGO "  . Alcohol Use: 0.0 oz/week    0 Not specified per week     Comment: occ  . Drug Use: No  . Sexual Activity: Not on file   Other Topics Concern  . Not on file   Social History Narrative     Filed Vitals:   02/25/14 0948  BP: 110/70  Pulse: 92  Height: 5\' 2"  (1.575 m)  Weight: 156 lb (70.761 kg)  SpO2: 100%    PHYSICAL EXAM General: NAD  Neck: No JVD, no thyromegaly.  Lungs: Clear to auscultation bilaterally with normal respiratory effort.  CV: Nondisplaced PMI. Well-healed midline sternotomy. Regular rate and rhythm, normal S1/S2, no S3/S4, soft 1/6 systolic murmur along left sternal border. No pretibial or periankle edema. No carotid bruit. Normal pedal pulses.  Abdomen: Soft, nontender, no hepatosplenomegaly, no distention.  Neurologic: Alert and oriented x 3.  Psych: Normal affect. Skin: Normal. Musculoskeletal: Normal range of motion, no gross deformities. Extremities: No clubbing or cyanosis.   ECG: Most recent ECG reviewed.      ASSESSMENT AND PLAN: 1. CAD s/p 4-v CABG: Stable ischemic heart disease. Denies symptoms of chest pain and shortness of breath. Continue ASA, Lipitor, Coreg, and lisinopril.  2. Essential HTN: Controlled on current therapy. No changes. 3. Hyperlipidemia: Continue high-dose Lipitor.  4. Ischemic cardiomyopathy/chronic systolic heart failure: Appears euvolemic. Sodium-restricted diet previously emphasized. She knows to weigh herself daily. LV systolic function will be reevaluated to see if she is a candidate for AICD.  5. Insulin-dependent diabetes mellitus: Management per Dr. Dimas Aguas (PCP). On insulin. 6. Aortic stenosis: Will reevaluate severity by echocardiogram, being ordered primarily to assess LV systolic function/EF. 7. Pericardial effusion: Will reevaluate size by echo.  Dispo: f/u 6 months.   Prentice Docker, M.D.,  F.A.C.C.

## 2014-03-02 ENCOUNTER — Other Ambulatory Visit: Payer: Self-pay

## 2014-03-02 ENCOUNTER — Other Ambulatory Visit (INDEPENDENT_AMBULATORY_CARE_PROVIDER_SITE_OTHER): Payer: BC Managed Care – PPO

## 2014-03-02 DIAGNOSIS — I255 Ischemic cardiomyopathy: Secondary | ICD-10-CM

## 2014-03-02 DIAGNOSIS — I5022 Chronic systolic (congestive) heart failure: Secondary | ICD-10-CM

## 2014-03-02 DIAGNOSIS — I350 Nonrheumatic aortic (valve) stenosis: Secondary | ICD-10-CM

## 2014-03-02 DIAGNOSIS — I25812 Atherosclerosis of bypass graft of coronary artery of transplanted heart without angina pectoris: Secondary | ICD-10-CM

## 2014-03-08 ENCOUNTER — Telehealth: Payer: Self-pay | Admitting: *Deleted

## 2014-03-08 NOTE — Telephone Encounter (Signed)
-----   Message from Laqueta Linden, MD sent at 03/04/2014  9:34 AM EST ----- Please tell patient LV systolic function has normalized.

## 2014-03-08 NOTE — Telephone Encounter (Signed)
Notes Recorded by Lesle Chris, LPN on 64/33/2951 at 6:07 PM Left message to return call.

## 2014-03-14 NOTE — Telephone Encounter (Signed)
Notes Recorded by Lesle Chris, LPN on 50/06/7046 at 4:09 PM Patient notified and verbalized understanding.

## 2014-03-24 ENCOUNTER — Encounter (HOSPITAL_COMMUNITY): Payer: Self-pay | Admitting: Cardiology

## 2014-03-31 NOTE — Addendum Note (Signed)
Encounter addended by: Julious Payer, CCT on: 03/31/2014  3:25 PM<BR>     Documentation filed: Flowsheet VN

## 2014-10-10 ENCOUNTER — Other Ambulatory Visit: Payer: Self-pay

## 2014-12-08 ENCOUNTER — Ambulatory Visit: Payer: Self-pay | Admitting: Cardiovascular Disease

## 2015-01-10 ENCOUNTER — Ambulatory Visit (INDEPENDENT_AMBULATORY_CARE_PROVIDER_SITE_OTHER): Payer: BLUE CROSS/BLUE SHIELD | Admitting: Cardiovascular Disease

## 2015-01-10 ENCOUNTER — Encounter: Payer: Self-pay | Admitting: Cardiovascular Disease

## 2015-01-10 VITALS — BP 125/73 | HR 91 | Ht 62.0 in | Wt 160.0 lb

## 2015-01-10 DIAGNOSIS — E785 Hyperlipidemia, unspecified: Secondary | ICD-10-CM

## 2015-01-10 DIAGNOSIS — I10 Essential (primary) hypertension: Secondary | ICD-10-CM

## 2015-01-10 DIAGNOSIS — I25812 Atherosclerosis of bypass graft of coronary artery of transplanted heart without angina pectoris: Secondary | ICD-10-CM

## 2015-01-10 NOTE — Progress Notes (Signed)
Patient ID: Isabel [name], female   DOB: 16-Aug-1965, 49 y.o.   MRN: [id number]      SUBJECTIVE: The patient is here for routine cardiovascular follow up for CAD with 4-v CABG performed in 2015. She had prior stenting in 2004. She also has hypertension, hyperlipidemia, insulin-dependent diabetes mellitus.  Echocardiogram performed on 03/02/14 showed normal left ventricular systolic function, LV EF 60-65%, grade 1 diastolic dysfunction , and mild left atrial dilatation.  She has had issues with an upper respiratory tract infection. She denies exertional chest pain and exertional dyspnea prior to this. She also denies palpitations and leg swelling.  Due to have lipids checked next month.  Review of Systems: As per "subjective", otherwise negative.  No Known Allergies  Current Outpatient Prescriptions  Medication Sig Dispense Refill  . ALPRAZolam (XANAX) 1 MG tablet Take 1 mg by mouth 4 (four) times daily as needed.    Marland Kitchen aspirin EC 81 MG tablet Take 81 mg by mouth daily.    Marland Kitchen atorvastatin (LIPITOR) 80 MG tablet Take 1 tablet (80 mg total) by mouth daily at 6 PM. 30 tablet 1  . carvedilol (COREG) 3.125 MG tablet Take 1 tablet (3.125 mg total) by mouth 2 (two) times daily. 60 tablet 6  . cefUROXime (CEFTIN) 500 MG tablet Take 1 tablet by mouth 2 (two) times daily.    . furosemide (LASIX) 40 MG tablet Take 1 tablet (40 mg total) by mouth daily. 30 tablet 1  . HUMALOG KWIKPEN 100 UNIT/ML KiwkPen Inject into the skin as directed. Per sliding scale    . Insulin Detemir (LEVEMIR) 100 UNIT/ML Pen Inject 100 Units into the skin every morning.     . Insulin Pen Needle 29G X MISC 1 box 1 each 2  . lisinopril (PRINIVIL,ZESTRIL) 5 MG tablet Take 1 tablet (5 mg total) by mouth daily. 30 tablet 1  . omeprazole (PRILOSEC) 20 MG capsule Take 1 capsule by mouth 2 (two) times daily.    . polyethylene glycol (MIRALAX / GLYCOLAX) packet Take 17 g by mouth daily as needed.    . potassium chloride 20 MEQ  TBCR Take 20 mEq by mouth daily. 30 tablet 1  . PROAIR HFA 108 (90 BASE) MCG/ACT inhaler Inhale 2 puffs into the lungs 4 (four) times daily as needed.    . traMADol (ULTRAM) 50 MG tablet Take 1-2 tablets (50-100 mg total) by mouth every 4 (four) hours as needed for moderate pain. 30 tablet 1   No current facility-administered medications for this visit.    Past Medical History  Diagnosis Date  . Coronary artery disease   . Diabetes mellitus without complication   . Renal disorder     kidney stones  . CHF (congestive heart failure)     Past Surgical History  Procedure Laterality Date  . Cardiac stents    . Back surgery    . Kidney stone surgery    . Fallopian tube and 1 ovary removed    . Tonsillectomy    . Coronary artery bypass graft N/A 06/08/2013    Procedure: CORONARY ARTERY BYPASS GRAFTING (CABG) times four using left internal mammary and right saphenous vein.;  Surgeon: Delight Ovens, MD;  Location: [facility] OR;  Service: Open Heart Surgery;  Laterality: N/A;  . Intraoperative transesophageal echocardiogram N/A 06/08/2013    Procedure: INTRAOPERATIVE TRANSESOPHAGEAL ECHOCARDIOGRAM;  Surgeon: Delight Ovens, MD;  Location: [facility] OR;  Service: Open Heart Surgery;  Laterality: N/A;  . Left and right heart  catheterization with coronary angiogram N/A 06/07/2013    Procedure: LEFT AND RIGHT HEART CATHETERIZATION WITH CORONARY ANGIOGRAM;  Surgeon: Marykay Lex, MD;  Location: [facility] CATH LAB;  Service: Cardiovascular;  Laterality: N/A;    Social History   Social History  . Marital Status: Married    Spouse Name: N/A  . Number of Children: N/A  . Years of Education: N/A   Occupational History  . Not on file.   Social History Main Topics  . Smoking status: Former Smoker -- 0.50 packs/day for 10 years    Types: Cigarettes    Start date: 12/26/1992    Quit date: 04/15/2000  . Smokeless tobacco: Never Used     Comment: QUIT SMOKING YEARS AGO "  . Alcohol Use: 0.0 oz/week    0  Standard drinks or equivalent per week     Comment: occ  . Drug Use: No  . Sexual Activity: Not on file   Other Topics Concern  . Not on file   Social History Narrative     Filed Vitals:   01/10/15 1552  BP: 125/73  Pulse: 91  Height:  (1.575 m)  Weight: 160 lb (72.576 kg)    PHYSICAL EXAM General: NAD HEENT: Normal. Neck: No JVD, no thyromegaly. Lungs: Clear to auscultation bilaterally with normal respiratory effort. CV: Nondisplaced PMI.  Regular rate and rhythm, normal S1/S2, no S3/S4, no murmur. No pretibial or periankle edema.  No carotid bruit.  Normal pedal pulses.  Abdomen: Soft, nontender, obese, no distention.  Neurologic: Alert and oriented x 3.  Psych: Normal affect. Musculoskeletal: Normal range of motion, no gross deformities. Extremities: No clubbing or cyanosis.   ECG: Most recent ECG reviewed.      ASSESSMENT AND PLAN: 1. CAD s/p 4-v CABG: Stable ischemic heart disease. Continue ASA, Lipitor, Coreg, and lisinopril. As LV function has normalized, will stop Lasix and potassium.  2. Essential HTN: Controlled on current therapy. No changes.  3. Hyperlipidemia: Continue high-dose Lipitor. Due to have lipids checked next month. Will obtain copy of results.  Dispo: f/u 1 year.  Prentice Docker, M.D., F.A.C.C.

## 2015-01-10 NOTE — Patient Instructions (Signed)
   Stop Lasix   Stop Potassium Continue all other medications.   Your physician wants you to follow up in:  1 year.  You will receive a reminder letter in the mail one-two months in advance.  If you don't receive a letter, please call our office to schedule the follow up appointment

## 2015-07-19 IMAGING — CR DG ABDOMEN ACUTE W/ 1V CHEST
3 series · 3 of 3 positions shown · non-contrast
Comparison: Chest radiograph July 04, 2008

CLINICAL DATA: Difficulty breathing and constipation

EXAM:
ACUTE ABDOMEN SERIES (ABDOMEN 2 VIEW & CHEST 1 VIEW)

[view not recorded (1 of 3)]
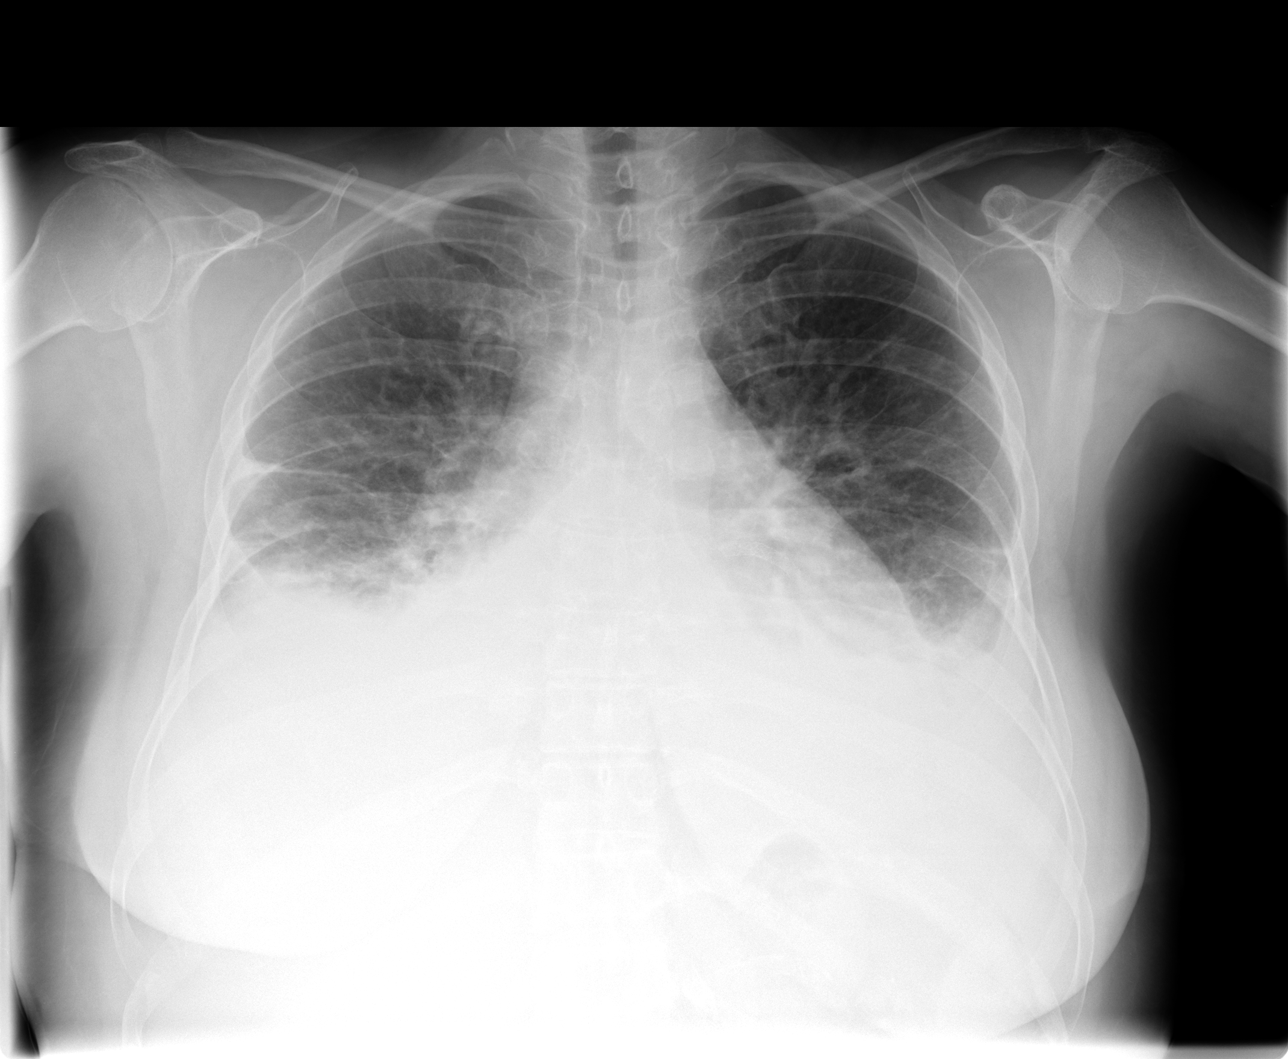

[view not recorded (2 of 3)]
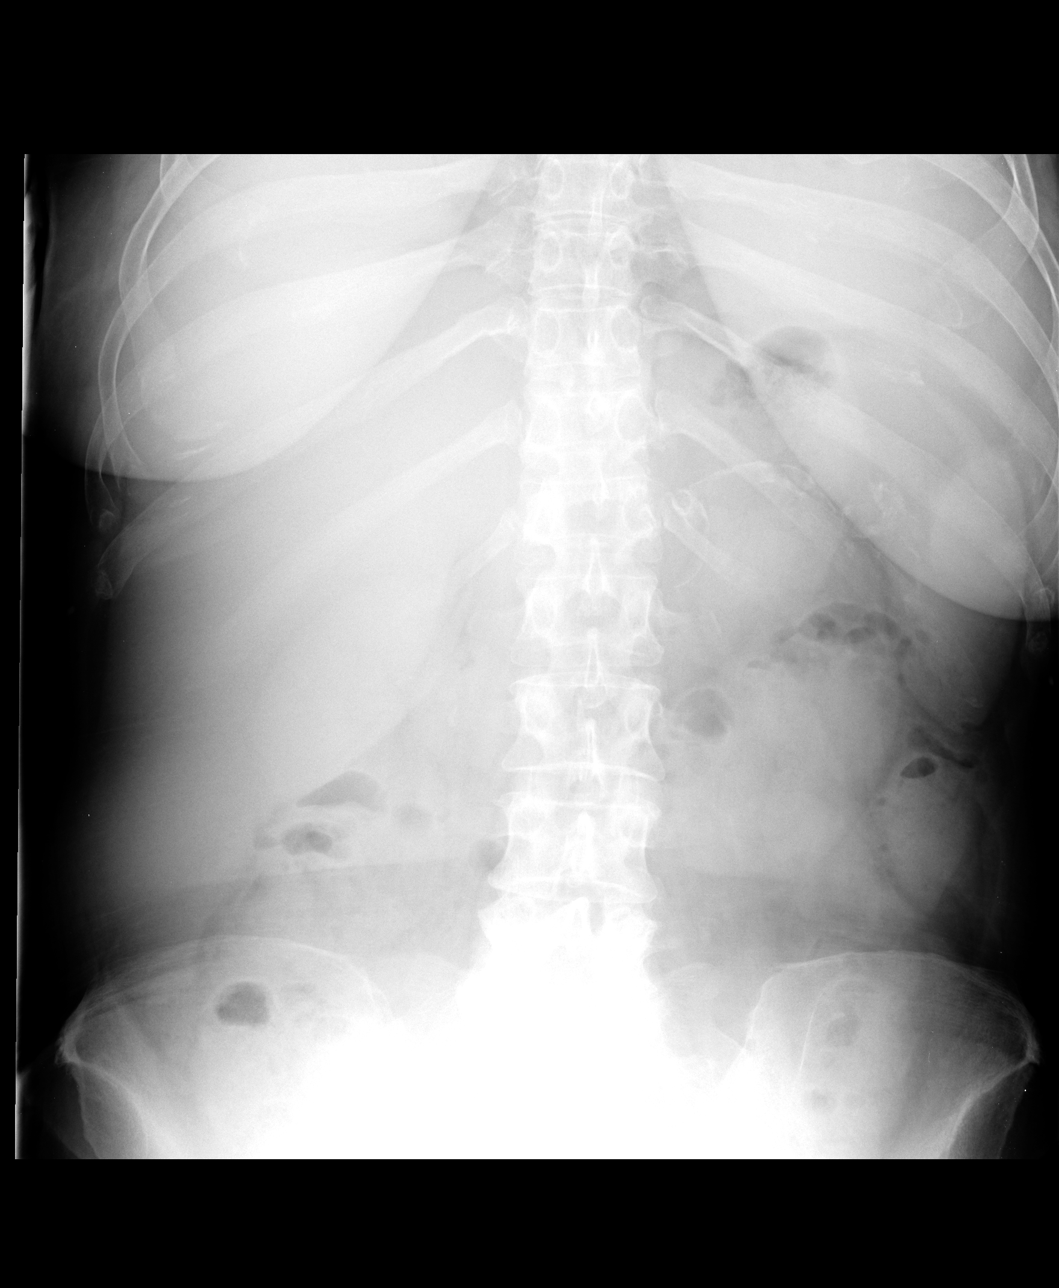

[view not recorded (3 of 3)]
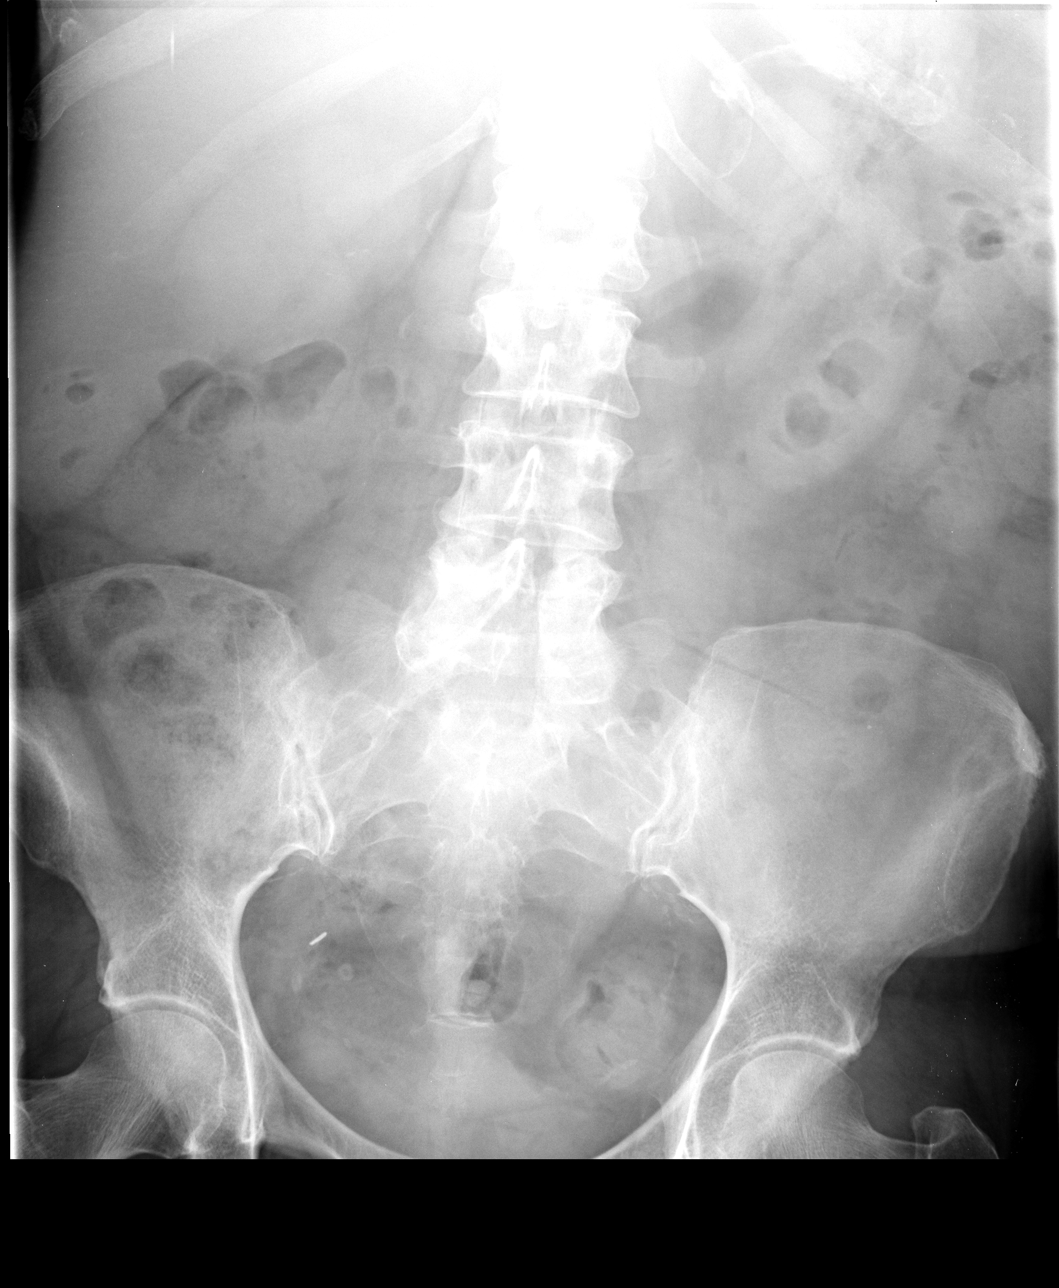

[3 of 3 positions shown; findings below may reference images not displayed]

FINDINGS: PA chest: There is cardiomegaly with bibasilar edema and bilateral
effusions. There is mild pulmonary venous hypertension. There is
mild patchy consolidation in the medial bases bilaterally.

Supine and upright abdomen: There is moderate stool in the colon.
The bowel gas pattern is unremarkable. No obstruction or free air.
Liver appears enlarged. There is a surgical clip in the right
pelvis. There are areas of vascular calcification.
IMPRESSION: Congestive heart failure.

Bowel gas pattern unremarkable.  No obstruction or free air.

Hepatomegaly.

## 2015-07-26 ENCOUNTER — Emergency Department (HOSPITAL_COMMUNITY): Payer: BLUE CROSS/BLUE SHIELD

## 2015-07-26 ENCOUNTER — Inpatient Hospital Stay (HOSPITAL_COMMUNITY)
Admission: EM | Admit: 2015-07-26 | Discharge: 2015-07-29 | DRG: 300 | Disposition: A | Discharge status: Home / Self Care | Payer: BLUE CROSS/BLUE SHIELD | Attending: Family Medicine | Admitting: Family Medicine

## 2015-07-26 ENCOUNTER — Encounter (HOSPITAL_COMMUNITY): Payer: Self-pay | Admitting: Family Medicine

## 2015-07-26 DIAGNOSIS — E10621 Type 1 diabetes mellitus with foot ulcer: Secondary | ICD-10-CM | POA: Diagnosis present

## 2015-07-26 DIAGNOSIS — L03116 Cellulitis of left lower limb: Secondary | ICD-10-CM | POA: Diagnosis present

## 2015-07-26 DIAGNOSIS — Z833 Family history of diabetes mellitus: Secondary | ICD-10-CM

## 2015-07-26 DIAGNOSIS — Z79899 Other long term (current) drug therapy: Secondary | ICD-10-CM | POA: Diagnosis not present

## 2015-07-26 DIAGNOSIS — I509 Heart failure, unspecified: Secondary | ICD-10-CM | POA: Diagnosis present

## 2015-07-26 DIAGNOSIS — Z87442 Personal history of urinary calculi: Secondary | ICD-10-CM

## 2015-07-26 DIAGNOSIS — E10628 Type 1 diabetes mellitus with other skin complications: Secondary | ICD-10-CM | POA: Diagnosis present

## 2015-07-26 DIAGNOSIS — Z7982 Long term (current) use of aspirin: Secondary | ICD-10-CM

## 2015-07-26 DIAGNOSIS — Z951 Presence of aortocoronary bypass graft: Secondary | ICD-10-CM | POA: Diagnosis not present

## 2015-07-26 DIAGNOSIS — Z8249 Family history of ischemic heart disease and other diseases of the circulatory system: Secondary | ICD-10-CM | POA: Diagnosis not present

## 2015-07-26 DIAGNOSIS — I96 Gangrene, not elsewhere classified: Secondary | ICD-10-CM | POA: Diagnosis present

## 2015-07-26 DIAGNOSIS — E1152 Type 2 diabetes mellitus with diabetic peripheral angiopathy with gangrene: Secondary | ICD-10-CM | POA: Diagnosis not present

## 2015-07-26 DIAGNOSIS — I771 Stricture of artery: Secondary | ICD-10-CM | POA: Diagnosis present

## 2015-07-26 DIAGNOSIS — Z794 Long term (current) use of insulin: Secondary | ICD-10-CM

## 2015-07-26 DIAGNOSIS — Z87891 Personal history of nicotine dependence: Secondary | ICD-10-CM

## 2015-07-26 DIAGNOSIS — I2510 Atherosclerotic heart disease of native coronary artery without angina pectoris: Secondary | ICD-10-CM | POA: Diagnosis present

## 2015-07-26 DIAGNOSIS — Z955 Presence of coronary angioplasty implant and graft: Secondary | ICD-10-CM | POA: Diagnosis not present

## 2015-07-26 DIAGNOSIS — Z823 Family history of stroke: Secondary | ICD-10-CM

## 2015-07-26 DIAGNOSIS — IMO0002 Reserved for concepts with insufficient information to code with codable children: Secondary | ICD-10-CM | POA: Diagnosis present

## 2015-07-26 DIAGNOSIS — E1165 Type 2 diabetes mellitus with hyperglycemia: Secondary | ICD-10-CM | POA: Diagnosis not present

## 2015-07-26 DIAGNOSIS — I110 Hypertensive heart disease with heart failure: Secondary | ICD-10-CM | POA: Diagnosis present

## 2015-07-26 DIAGNOSIS — E1065 Type 1 diabetes mellitus with hyperglycemia: Secondary | ICD-10-CM | POA: Diagnosis present

## 2015-07-26 DIAGNOSIS — D649 Anemia, unspecified: Secondary | ICD-10-CM | POA: Diagnosis present

## 2015-07-26 DIAGNOSIS — E1052 Type 1 diabetes mellitus with diabetic peripheral angiopathy with gangrene: Secondary | ICD-10-CM | POA: Diagnosis present

## 2015-07-26 DIAGNOSIS — E785 Hyperlipidemia, unspecified: Secondary | ICD-10-CM | POA: Diagnosis present

## 2015-07-26 DIAGNOSIS — Z01810 Encounter for preprocedural cardiovascular examination: Secondary | ICD-10-CM | POA: Diagnosis not present

## 2015-07-26 LAB — BASIC METABOLIC PANEL
Anion gap: 8 (ref 5–15)
BUN: 20 mg/dL (ref 6–20)
CHLORIDE: 108 mmol/L (ref 101–111)
CO2: 26 mmol/L (ref 22–32)
CREATININE: 0.84 mg/dL (ref 0.44–1.00)
Calcium: 9.5 mg/dL (ref 8.9–10.3)
GFR calc Af Amer: >60 mL/min (ref >60)
GFR calc non Af Amer: >60 mL/min (ref >60)
Glucose, Bld: 87 mg/dL (ref 65–99)
POTASSIUM: 4.2 mmol/L (ref 3.5–5.1)
SODIUM: 142 mmol/L (ref 135–145)

## 2015-07-26 LAB — GLUCOSE, CAPILLARY: GLUCOSE-CAPILLARY: 91 mg/dL (ref 65–99)

## 2015-07-26 LAB — I-STAT CG4 LACTIC ACID, ED
LACTIC ACID, VENOUS: 0.72 mmol/L (ref 0.5–2.0)
Lactic Acid, Venous: 1.02 mmol/L (ref 0.5–2.0)

## 2015-07-26 LAB — CBC WITH DIFFERENTIAL/PLATELET
BASOS ABS: 0 10*3/uL (ref 0.0–0.1)
BASOS PCT: 1 %
EOS ABS: 0.3 10*3/uL (ref 0.0–0.7)
EOS PCT: 4 %
HCT: 32.8 % — ABNORMAL LOW (ref 36.0–46.0)
Hemoglobin: 11.2 g/dL — ABNORMAL LOW (ref 12.0–15.0)
Lymphocytes Relative: 31 %
Lymphs Abs: 2.3 10*3/uL (ref 0.7–4.0)
MCH: 31.3 pg (ref 26.0–34.0)
MCHC: 34.1 g/dL (ref 30.0–36.0)
MCV: 91.6 fL (ref 78.0–100.0)
MONO ABS: 0.4 10*3/uL (ref 0.1–1.0)
Monocytes Relative: 6 %
Neutro Abs: 4.3 10*3/uL (ref 1.7–7.7)
Neutrophils Relative %: 58 %
Platelets: 225 10*3/uL (ref 150–400)
RBC: 3.58 MIL/uL — ABNORMAL LOW (ref 3.87–5.11)
RDW: 13.2 % (ref 11.5–15.5)
WBC: 7.4 10*3/uL (ref 4.0–10.5)

## 2015-07-26 LAB — SEDIMENTATION RATE: SED RATE: 74 mm/h — AB (ref 0–22)

## 2015-07-26 MED ORDER — INSULIN DETEMIR 100 UNIT/ML ~~LOC~~ SOLN
20.0000 [IU] | Freq: Every day | SUBCUTANEOUS | Status: DC
  Administered 2015-07-27 – 2015-07-29 (×3): 20 [IU] via SUBCUTANEOUS
  Filled 2015-07-26 (×4): qty 0.2

## 2015-07-26 MED ORDER — VANCOMYCIN HCL IN DEXTROSE 1-5 GM/200ML-% IV SOLN
1000.0000 mg | Freq: Once | INTRAVENOUS | Status: AC
  Administered 2015-07-26: 1000 mg via INTRAVENOUS
  Filled 2015-07-26: qty 200

## 2015-07-26 MED ORDER — METRONIDAZOLE 500 MG PO TABS
500.0000 mg | ORAL_TABLET | Freq: Three times a day (TID) | ORAL | Status: DC
  Administered 2015-07-27 – 2015-07-29 (×8): 500 mg via ORAL
  Filled 2015-07-26 (×8): qty 1

## 2015-07-26 MED ORDER — PIPERACILLIN-TAZOBACTAM 3.375 G IVPB 30 MIN
3.3750 g | Freq: Once | INTRAVENOUS | Status: AC
Start: 2015-07-26 — End: 2015-07-26
  Administered 2015-07-26: 3.375 g via INTRAVENOUS
  Filled 2015-07-26: qty 50

## 2015-07-26 MED ORDER — CARVEDILOL 3.125 MG PO TABS
3.1250 mg | ORAL_TABLET | Freq: Two times a day (BID) | ORAL | Status: DC
  Administered 2015-07-27 – 2015-07-29 (×6): 3.125 mg via ORAL
  Filled 2015-07-26 (×6): qty 1

## 2015-07-26 MED ORDER — ONDANSETRON HCL 4 MG PO TABS: 4.0000 mg | ORAL_TABLET | Freq: Four times a day (QID) | ORAL | Status: DC | PRN

## 2015-07-26 MED ORDER — ATORVASTATIN CALCIUM 80 MG PO TABS
80.0000 mg | ORAL_TABLET | Freq: Every day | ORAL | Status: DC
  Administered 2015-07-27 – 2015-07-28 (×2): 80 mg via ORAL
  Filled 2015-07-26 (×2): qty 1

## 2015-07-26 MED ORDER — ACETAMINOPHEN 325 MG PO TABS: 650.0000 mg | ORAL_TABLET | Freq: Four times a day (QID) | ORAL | Status: DC | PRN

## 2015-07-26 MED ORDER — SODIUM CHLORIDE 0.9 % IV SOLN
INTRAVENOUS | Status: DC
  Administered 2015-07-27 – 2015-07-29 (×4): via INTRAVENOUS

## 2015-07-26 MED ORDER — ASPIRIN EC 81 MG PO TBEC
81.0000 mg | DELAYED_RELEASE_TABLET | Freq: Every day | ORAL | Status: DC
  Administered 2015-07-28 – 2015-07-29 (×2): 81 mg via ORAL
  Filled 2015-07-26 (×2): qty 1

## 2015-07-26 MED ORDER — INSULIN ASPART 100 UNIT/ML ~~LOC~~ SOLN
0.0000 [IU] | SUBCUTANEOUS | Status: DC
  Administered 2015-07-27: 2 [IU] via SUBCUTANEOUS
  Administered 2015-07-27: 3 [IU] via SUBCUTANEOUS
  Administered 2015-07-27: 1 [IU] via SUBCUTANEOUS
  Administered 2015-07-28: 3 [IU] via SUBCUTANEOUS
  Administered 2015-07-28: 2 [IU] via SUBCUTANEOUS
  Administered 2015-07-28: 1 [IU] via SUBCUTANEOUS
  Administered 2015-07-28: 3 [IU] via SUBCUTANEOUS
  Administered 2015-07-29 (×2): 2 [IU] via SUBCUTANEOUS
  Administered 2015-07-29: 1 [IU] via SUBCUTANEOUS
  Administered 2015-07-29: 3 [IU] via SUBCUTANEOUS

## 2015-07-26 MED ORDER — ACETAMINOPHEN 650 MG RE SUPP: 650.0000 mg | Freq: Four times a day (QID) | RECTAL | Status: DC | PRN

## 2015-07-26 MED ORDER — DEXTROSE 5 % IV SOLN
2.0000 g | INTRAVENOUS | Status: DC
  Administered 2015-07-27 – 2015-07-29 (×3): 2 g via INTRAVENOUS
  Filled 2015-07-26 (×5): qty 2

## 2015-07-26 MED ORDER — ONDANSETRON HCL 4 MG/2ML IJ SOLN: 4.0000 mg | Freq: Four times a day (QID) | INTRAMUSCULAR | Status: DC | PRN

## 2015-07-26 MED ORDER — POLYETHYLENE GLYCOL 3350 17 G PO PACK
17.0000 g | PACK | Freq: Every day | ORAL | Status: DC | PRN
Start: 2015-07-26 — End: 2015-07-29

## 2015-07-26 MED ORDER — HYDROCODONE-ACETAMINOPHEN 5-325 MG PO TABS
1.0000 | ORAL_TABLET | Freq: Once | ORAL | Status: AC
  Administered 2015-07-26: 1 via ORAL
  Filled 2015-07-26: qty 1

## 2015-07-26 MED ORDER — ALPRAZOLAM 0.5 MG PO TABS: 1.0000 mg | ORAL_TABLET | Freq: Four times a day (QID) | ORAL | Status: DC | PRN

## 2015-07-26 MED ORDER — PANTOPRAZOLE SODIUM 40 MG PO TBEC
40.0000 mg | DELAYED_RELEASE_TABLET | Freq: Every day | ORAL | Status: DC
  Administered 2015-07-28 – 2015-07-29 (×2): 40 mg via ORAL
  Filled 2015-07-26 (×2): qty 1

## 2015-07-26 MED ORDER — HYDROCODONE-ACETAMINOPHEN 5-325 MG PO TABS
1.0000 | ORAL_TABLET | ORAL | Status: DC | PRN
  Administered 2015-07-27 – 2015-07-28 (×4): 2 via ORAL
  Filled 2015-07-26 (×4): qty 2

## 2015-07-26 NOTE — Consult Note (Signed)
Vascular Surgery Consultation  Reason for Consult: Gangrene left fifth toe  HPI: Isabel [name] is a 50 y.o. female who presents for evaluation of gangrene left fifth toe. Patient developed blister on left fifth toe about 6-8 weeks ago. This has gradually progressed to dry gangrene. She was seen in the wound center earlier today. She came to the emergency department or pain. She has not had a vascular evaluation. She does have a previous history of claudication in the left calf after walking about 1 block. She has no previous history of nonhealing ulcers infection or gangrene. She has no symptoms in the contralateral right leg. She did have coronary artery bypass grafting in 2015. She quit smoking about 20 years ago. She has type 1 diabetes mellitus. She has had no chills and fever.   Past Medical History  Diagnosis Date  . Coronary artery disease   . Diabetes mellitus without complication (HCC)   . Renal disorder     kidney stones  . CHF (congestive heart failure) [facility])    Past Surgical History  Procedure Laterality Date  . Cardiac stents    . Back surgery    . Kidney stone surgery    . Fallopian tube and 1 ovary removed    . Tonsillectomy    . Coronary artery bypass graft N/A 06/08/2013    Procedure: CORONARY ARTERY BYPASS GRAFTING (CABG) times four using left internal mammary and right saphenous vein.;  Surgeon: Delight Ovens, MD;  Location: [facility] OR;  Service: Open Heart Surgery;  Laterality: N/A;  . Intraoperative transesophageal echocardiogram N/A 06/08/2013    Procedure: INTRAOPERATIVE TRANSESOPHAGEAL ECHOCARDIOGRAM;  Surgeon: Delight Ovens, MD;  Location: [facility] OR;  Service: Open Heart Surgery;  Laterality: N/A;  . Left and right heart catheterization with coronary angiogram N/A 06/07/2013    Procedure: LEFT AND RIGHT HEART CATHETERIZATION WITH CORONARY ANGIOGRAM;  Surgeon: Marykay Lex, MD;  Location: [facility] CATH LAB;  Service: Cardiovascular;  Laterality: N/A;   Social History    Social History  . Marital Status: Married    Spouse Name: N/A  . Number of Children: N/A  . Years of Education: N/A   Social History Main Topics  . Smoking status: Former Smoker -- 0.50 packs/day for 10 years    Types: Cigarettes    Start date: 12/26/1992    Quit date: 04/15/2000  . Smokeless tobacco: Never Used     Comment: QUIT SMOKING YEARS AGO "  . Alcohol Use: 0.0 oz/week    0 Standard drinks or equivalent per week     Comment: occ  . Drug Use: No  . Sexual Activity: Not Asked   Other Topics Concern  . None   Social History Narrative   Family History  Problem Relation Age of Onset  . Heart disease Mother   . Diabetes Mother   . Stroke Mother   . Cancer Father   . Heart disease Father   . Diabetes Father   . Heart disease Brother   . Diabetes Paternal Aunt   . Stroke Maternal Grandmother   . Diabetes Paternal Grandfather    No Known Allergies Prior to Admission medications   Medication Sig Start Date End Date Taking? Authorizing Provider  ALPRAZolam Prudy Feeler) 1 MG tablet Take 1 mg by mouth 4 (four) times daily as needed for anxiety.  05/06/13  Yes Historical Provider, MD  aspirin EC 81 MG tablet Take 81 mg by mouth daily.   Yes Historical Provider, MD  atorvastatin (  LIPITOR) 80 MG tablet Take 1 tablet (80 mg total) by mouth daily at 6 PM. 06/18/13  Yes Gina L Collins, PA-C  carvedilol (COREG) 3.125 MG tablet Take 1 tablet (3.125 mg total) by mouth 2 (two) times daily. 11/30/13  Yes Laqueta Linden, MD  HUMALOG KWIKPEN 100 UNIT/ML KiwkPen Inject 10 Units into the skin daily as needed. Per sliding scale. For high sugar 10/22/13  Yes Historical Provider, MD  HYDROcodone-acetaminophen (NORCO) 10-325 MG tablet Take 1 tablet by mouth every 6 (six) hours as needed for moderate pain.   Yes Historical Provider, MD  Insulin Detemir (LEVEMIR) 100 UNIT/ML Pen Inject 100 Units into the skin See admin instructions. Take 40 units in the morning, then take 20 units in the  afternoon, then take 20 units again to equal out to 100units a day per patient 06/18/13  Yes Wilmon Pali, PA-C  lisinopril (PRINIVIL,ZESTRIL) 5 MG tablet Take 1 tablet (5 mg total) by mouth daily. 06/18/13  Yes Wilmon Pali, PA-C  omeprazole (PRILOSEC) 20 MG capsule Take 1 capsule by mouth 2 (two) times daily. 11/17/13  Yes Historical Provider, MD  polyethylene glycol (MIRALAX / GLYCOLAX) packet Take 17 g by mouth daily as needed for mild constipation.  06/18/13  Yes Gina L Collins, PA-C  PROAIR HFA 108 (90 BASE) MCG/ACT inhaler Inhale 2 puffs into the lungs 4 (four) times daily as needed for shortness of breath.  01/09/15  Yes Historical Provider, MD  traMADol (ULTRAM) 50 MG tablet Take 1-2 tablets (50-100 mg total) by mouth every 4 (four) hours as needed for moderate pain. 06/18/13  Yes Wilmon Pali, PA-C  Insulin Pen Needle 29G X MISC 1 box 06/18/13   Wilmon Pali, PA-C     Positive ROS: Denies chest pain, dyspnea on exertion, PND, orthopnea, hemoptysis, lateralizing weakness, aphasia, amaurosis fugax, diplopia, blurred vision, syncope. Also has history of kidney stones. Has had coronary artery bypass grafting. Denies arrhythmias. Is not on anticoagulants.  All other systems have been reviewed and were otherwise negative with the exception of those mentioned in the HPI and as above.  Physical Exam: Filed Vitals:   07/26/15 2015 07/26/15 2100  BP: 152/80 169/79  Pulse: 79 81  Temp:    Resp:      General: Alert, no acute distress HEENT: Normal for age Cardiovascular: Regular rate and rhythm. Carotid pulses 2+, no bruits audible Respiratory: Clear to auscultation. No cyanosis, no use of accessory musculature GI: No organomegaly, abdomen is soft and non-tender Skin: No lesions in the area of chief complaint Neurologic: Sensation intact distally Psychiatric: Patient is competent for consent with normal mood and affect Musculoskeletal: No obvious deformities Extremities: Right leg with  3+ femoral 2+ popliteal and 2+ dorsalis pedis pulse palpable. Left leg with absent femoral and distal pulses. Dry gangrene distal half of left fifth toe. Mild swelling dorsum of foot and laterally. No fluctuance or purulence drainage.   Imaging reviewed: ABIs apparently done a few weeks ago which revealed 0.42 on the left and 0.87 on the right.   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 Edilia Bo if schedule permits although it could possibly be done on Friday if necessary. Patient may well need inflow and outflow procedure such as possible iliac stent or right to left femoral-femoral bypass and left femoral-popliteal bypass graft. This will be dependent on findings of angiography  Discuss this plan with patient and her husband and they are agreeable Needs to be nothing by mouth tomorrow morning-Thursday after 7 AM   Josephina Gip, MD 07/26/2015 9:22 PM

## 2015-07-26 NOTE — H&P (Signed)
History and Physical  Patient Name: Isabel [name]     ZOX:[id number]    DOB: 1965-12-31    DOA: 07/26/2015 Referring physician: Rhoderick Moody, MD PCP: [name], MD      Chief Complaint: Toe pain, blackness  HPI: Isabel [name] is a 50 y.o. female with a past medical history significant for IDDM, CAD s/p CABG x4 2015 who presents with toe gangrene.  The patient initially developed a blister on her left pinky toe in February after walking on the carvedilol with a grandchild. She saw her PCP, was given a shot ceftriaxone followed by Keflex for a short course, which did nothing.  She will return to see her PCP is some mild surrounding redness on this toe, was prescribed doxycycline short course, which also helped minimally.  She returned a third time, was given a long course of doxycycline (which she is still on), and referred to wound clinic and for ABIs. She saw wound clinic finally yesterday, ABI on the left reportedly 0.4, today was asked to go to the ER.    She notes that over the last week the foot has a can become red and tender to mid shin, puffy. Also in about the last week and a half, the toe has become black. She has not had fever, chills, vomiting, confusion, fatigue, syncope.  In the ED, she was afebrile and hemodynamically stable.  Na 142, K 4.2, Cr 0.84, glucose 80s.  Lactate normal, WBC 7.4K, Hgb 11.2, and platelets 225.  Plain radiograph of the left foot showed cellulitis without bony destruction. Blood cultures were obtained and the patient was administered vancomycin and Zosyn. Vascular surgery consult, and TRH were asked to evaluate for admission.       Review of Systems:  Pt complains of leg swelling, redness, pain, blackness toe. Pt denies any chest pain, shortness of breath, irregular heartbeats, difficulty breathing with exertion.  All other systems negative except as just noted or noted in the history of present illness.  No Known Allergies  Prior to Admission  medications   Medication Sig Start Date End Date Taking? Authorizing Provider  ALPRAZolam Prudy Feeler) 1 MG tablet Take 1 mg by mouth 4 (four) times daily as needed for anxiety.  05/06/13  Yes Historical Provider, MD  aspirin EC 81 MG tablet Take 81 mg by mouth daily.   Yes Historical Provider, MD  atorvastatin (LIPITOR) 80 MG tablet Take 1 tablet (80 mg total) by mouth daily at 6 PM. 06/18/13  Yes Gina L Collins, PA-C  carvedilol (COREG) 3.125 MG tablet Take 1 tablet (3.125 mg total) by mouth 2 (two) times daily. 11/30/13  Yes Laqueta Linden, MD  HUMALOG KWIKPEN 100 UNIT/ML KiwkPen Inject 10 Units into the skin daily as needed. Per sliding scale. For high sugar 10/22/13  Yes Historical Provider, MD  HYDROcodone-acetaminophen (NORCO) 10-325 MG tablet Take 1 tablet by mouth every 6 (six) hours as needed for moderate pain.   Yes Historical Provider, MD  Insulin Detemir (LEVEMIR) 100 UNIT/ML Pen Inject 100 Units into the skin See admin instructions. Take 40 units in the morning, then take 20 units in the afternoon, then take 20 units again to equal out to 100units a day per patient 06/18/13  Yes Wilmon Pali, PA-C  lisinopril (PRINIVIL,ZESTRIL) 5 MG tablet Take 1 tablet (5 mg total) by mouth daily. 06/18/13  Yes Wilmon Pali, PA-C  omeprazole (PRILOSEC) 20 MG capsule Take 1 capsule by mouth 2 (two) times daily. 11/17/13  Yes Historical Provider, MD  polyethylene glycol (MIRALAX / GLYCOLAX) packet Take 17 g by mouth daily as needed for mild constipation.  06/18/13  Yes Gina L Collins, PA-C  PROAIR HFA 108 (90 BASE) MCG/ACT inhaler Inhale 2 puffs into the lungs 4 (four) times daily as needed for shortness of breath.  01/09/15  Yes Historical Provider, MD  traMADol (ULTRAM) 50 MG tablet Take 1-2 tablets (50-100 mg total) by mouth every 4 (four) hours as needed for moderate pain. 06/18/13  Yes Wilmon Pali, PA-C  Insulin Pen Needle 29G X MISC 1 box 06/18/13   Wilmon Pali, PA-C    Past Medical History    Diagnosis Date  . Coronary artery disease   . Diabetes mellitus without complication (HCC)   . Renal disorder     kidney stones  . CHF (congestive heart failure) [facility])     Past Surgical History  Procedure Laterality Date  . Cardiac stents    . Back surgery    . Kidney stone surgery    . Fallopian tube and 1 ovary removed    . Tonsillectomy    . Coronary artery bypass graft N/A 06/08/2013    Procedure: CORONARY ARTERY BYPASS GRAFTING (CABG) times four using left internal mammary and right saphenous vein.;  Surgeon: Delight Ovens, MD;  Location: [facility] OR;  Service: Open Heart Surgery;  Laterality: N/A;  . Intraoperative transesophageal echocardiogram N/A 06/08/2013    Procedure: INTRAOPERATIVE TRANSESOPHAGEAL ECHOCARDIOGRAM;  Surgeon: Delight Ovens, MD;  Location: [facility] OR;  Service: Open Heart Surgery;  Laterality: N/A;  . Left and right heart catheterization with coronary angiogram N/A 06/07/2013    Procedure: LEFT AND RIGHT HEART CATHETERIZATION WITH CORONARY ANGIOGRAM;  Surgeon: Marykay Lex, MD;  Location: [facility] CATH LAB;  Service: Cardiovascular;  Laterality: N/A;    Family history: family history includes Cancer in her father; Diabetes in her father, mother, paternal aunt, and paternal grandfather; Heart disease in her brother, father, and mother; Stroke in her maternal grandmother and mother.  Social History: Patient lives with her husband.  She works for [employer]. She lives in [place]. She is not a smoker. She is independent with all ADLs and IADLs at baseline.       Physical Exam: BP 156/80 mmHg  Pulse 84  Temp(Src) 98.2 F (36.8 C)  Resp 20  SpO2 99% General appearance: Well-developed, adult female, alert and in no acute distress.  Oriented x4.    Eyes: Anicteric, conjunctiva pink, lids and lashes normal.     ENT: No nasal deformity, discharge, or epistaxis.  OP moist without lesions.   Lymph: No cervical or supraclavicular lymphadenopathy. Skin: Warm and dry.  No jaundice.   No suspicious rashes or lesions.  There is hyperemia and hairloss on Left lower extremity, slight swelling especially on dorusm of left foot, pain and tenderness to palpation up to shin, and dry blackening of the left 5th toe. Cardiac: RRR, nl S1-S2, no murmurs appreciated.  Capillary refill is brisk.  JVP normal.  Radial pulses 2+ and symmetric.  Right DP faintly palpable.  Left DP not palpable. Respiratory: Normal respiratory rate and rhythm.  CTAB without rales or wheezes. Abdomen: Abdomen soft without rigidity.  No TTP. No ascites, distension.   MSK: No deformities or effusions. Neuro: Sensorium intact and responding to questions, attention normal.  Speech is fluent.  Moves all extremities equally and with normal coordination.    Cranial nerves normal. Psych: Behavior appropriate.  Affect normal.  No evidence of aural or visual hallucinations or delusions.       Labs on Admission:  The metabolic panel shows normal electrolytes and renal function. Lactate normal.  Blood cultures pending. The complete blood count shows no leukocytosis or thrombocytopenia. There is anemia present.   Radiological Exams on Admission: Dg Foot Complete Left  07/26/2015  CLINICAL DATA:  Cellulitis with gangrenous fifth digit EXAM: LEFT FOOT - COMPLETE 3+ VIEW COMPARISON:  None. FINDINGS: Frontal, oblique and lateral views were obtained. There is loss of soft tissue in the fifth digit. There is no demonstrable fracture or dislocation. There is no appreciable joint space narrowing. No erosive change or bony destruction. There is a bone island in the medial proximal aspect of the first distal phalanx. There it is a small exostosis arising from the dorsal distal talus. There is a spur arising from the inferior calcaneus. IMPRESSION: Soft tissue loss in the fifth digit. No erosive change or bony destruction. No fracture or dislocation. No appreciable joint space narrowing. Small benign exostosis arising from the dorsal  distal talus. There is an inferior calcaneal spur. Electronically Signed   By: Bretta Bang III M.D.   On: 07/26/2015 17:54    Echocardiogram 2015: Ejection fraction 60-65% no significant valvular disease.     Assessment/Plan 1. Gangrene:  This is new.  Diabetic foot infection, now with gangrene.  Per Vascular surgery, patient has diminished DP and femoral pulses on LEFT, possibly will need stenting versus bypass.   Arteriogram planned tomorrow.  She has no risk factors for MRSA or DR organisms, and so I will defer broad spectrum coverage unless patient foot worsening or spikes fever -Ceftriaxone and metronidazole for now -Consult to Vascular surgery, appreciate cares -Clears until 0700 then NPO for arteriogram -MIVF after 0700 tomorrow -Ondansetron for nausea, acetaminophen or hydrocodone for pain -Check prealbumin, CRP, HIV -Nutrition, diabetes mgmt, Social work, and CM consults   2. CAD s/p CABG:  No active symptoms.  Previously had LVSD, last echocardiogram showed EF 60% and no significant valvular disease. -Continue aspirin, statin, BB -Hold ACEi while getting angiogram/dye tomorrow  3. IDDM:  Takes Levemir 40 in AM 20 at lunch and 40 in PM.  -Reduce Levemir to 20 in AM for now -Sliding scale corrections q4hrs tomorrow while NPO, and adjust Levemir as needed -Check HgbA1c  4. Anemia:  Unclear cause. -Check ferritin and iron studies, B12 and folate and reticulocytes      DVT PPx: SCDs for now Diet: NPO after 0700 Consultants: Vascular Code Status: FULL Family Communication: Husband at bedside  Medical decision making: What exists of the patient's previous chart was reviewed in depth and the case was discussed with Dr. Yetta Barre and Dr. Hart Rochester. Patient seen 9:17 PM on 07/26/2015.  Disposition Plan:  I recommend admission to medical surgical bed, inpatietn status.  Clinical condition: stable.  Anticipate arteriogram tomorrow, planned intervention, likely early next  week after that.  IV antibiotics and monitor for change.      Alberteen Sam [facility] Pager [phone]

## 2015-07-26 NOTE — ED Notes (Signed)
Admitting MD at the bedside.  

## 2015-07-26 NOTE — ED Provider Notes (Signed)
CSN: [id number]     Arrival date & time 07/26/15  1312 History   First MD Initiated Contact with Patient 07/26/15 1639     Chief Complaint  Patient presents with  . Wound Infection     (Consider location/radiation/quality/duration/timing/severity/associated sxs/prior Treatment) HPI 50 y.o. female with a history of CAD, IDDM, CABG presents to the emergency department noting a 6 week history of what started as a blister on her little toe of her left foot which then progressed to a nonhealing ulcer which was initially treated with outpatient wound care per her PCP with only gradual worsening. The patient states that over the last week her pain has progressively worsened and her toe has gradually turned black. Her PCP then referred her to wound care for further evaluation which was performed yesterday. During that evaluation she underwent ultrasound which found evidence consistent with occlusive disease with abnormal ABIs at 0.87 and 0.42 in the right and left leg respectively. Her bilateral lower extremity pulses were difficult to palpate bilaterally. She notes a one-week history of gradually worsening pain and swelling with slight redness progressing over the dorsum of her left foot concomittently with the color change and necrosis of her fifth toe. She denies any fever, chills, nausea, vomiting, diarrhea.   Past Medical History  Diagnosis Date  . Coronary artery disease   . Diabetes mellitus without complication (HCC)   . Renal disorder     kidney stones  . CHF (congestive heart failure) [facility])    Past Surgical History  Procedure Laterality Date  . Cardiac stents    . Back surgery    . Kidney stone surgery    . Fallopian tube and 1 ovary removed    . Tonsillectomy    . Coronary artery bypass graft N/A 06/08/2013    Procedure: CORONARY ARTERY BYPASS GRAFTING (CABG) times four using left internal mammary and right saphenous vein.;  Surgeon: Delight Ovens, MD;  Location: [facility] OR;  Service:  Open Heart Surgery;  Laterality: N/A;  . Intraoperative transesophageal echocardiogram N/A 06/08/2013    Procedure: INTRAOPERATIVE TRANSESOPHAGEAL ECHOCARDIOGRAM;  Surgeon: Delight Ovens, MD;  Location: [facility] OR;  Service: Open Heart Surgery;  Laterality: N/A;  . Left and right heart catheterization with coronary angiogram N/A 06/07/2013    Procedure: LEFT AND RIGHT HEART CATHETERIZATION WITH CORONARY ANGIOGRAM;  Surgeon: Marykay Lex, MD;  Location: [facility] CATH LAB;  Service: Cardiovascular;  Laterality: N/A;   Family History  Problem Relation Age of Onset  . Heart disease Mother   . Diabetes Mother   . Stroke Mother   . Cancer Father   . Heart disease Father   . Diabetes Father   . Heart disease Brother   . Diabetes Paternal Aunt   . Stroke Maternal Grandmother   . Diabetes Paternal Grandfather    Social History  Substance Use Topics  . Smoking status: Former Smoker -- 0.50 packs/day for 10 years    Types: Cigarettes    Start date: 12/26/1992    Quit date: 04/15/2000  . Smokeless tobacco: Never Used     Comment: QUIT SMOKING YEARS AGO "  . Alcohol Use: 0.0 oz/week    0 Standard drinks or equivalent per week     Comment: occ   OB History    No data available     Review of Systems  Constitutional: Positive for activity change. Negative for fever, chills, appetite change and fatigue.  Respiratory: Negative for cough, chest tightness and shortness of  breath.   Cardiovascular: Positive for leg swelling (left foot). Negative for chest pain.  Gastrointestinal: Negative for nausea, vomiting, abdominal pain and diarrhea.  Genitourinary: Negative for dysuria, urgency and frequency.  Musculoskeletal: Positive for gait problem. Negative for myalgias, back pain, arthralgias and neck pain.  Skin: Positive for rash and wound.  Neurological: Positive for numbness (distal peripheral neuropathy with decreased sensation, chronic). Negative for dizziness, syncope, weakness and  light-headedness.  All other systems reviewed and are negative.     Allergies  Review of patient's allergies indicates no known allergies.  Home Medications   Prior to Admission medications   Medication Sig Start Date End Date Taking? Authorizing Provider  ALPRAZolam Prudy Feeler) 1 MG tablet Take 1 mg by mouth 4 (four) times daily as needed for anxiety.  05/06/13  Yes Historical Provider, MD  aspirin EC 81 MG tablet Take 81 mg by mouth daily.   Yes Historical Provider, MD  atorvastatin (LIPITOR) 80 MG tablet Take 1 tablet (80 mg total) by mouth daily at 6 PM. 06/18/13  Yes Gina L Collins, PA-C  carvedilol (COREG) 3.125 MG tablet Take 1 tablet (3.125 mg total) by mouth 2 (two) times daily. 11/30/13  Yes Laqueta Linden, MD  HUMALOG KWIKPEN 100 UNIT/ML KiwkPen Inject 10 Units into the skin daily as needed. Per sliding scale. For high sugar 10/22/13  Yes Historical Provider, MD  HYDROcodone-acetaminophen (NORCO) 10-325 MG tablet Take 1 tablet by mouth every 6 (six) hours as needed for moderate pain.   Yes Historical Provider, MD  Insulin Detemir (LEVEMIR) 100 UNIT/ML Pen Inject 100 Units into the skin See admin instructions. Take 40 units in the morning, then take 20 units in the afternoon, then take 20 units again to equal out to 100units a day per patient 06/18/13  Yes Wilmon Pali, PA-C  lisinopril (PRINIVIL,ZESTRIL) 5 MG tablet Take 1 tablet (5 mg total) by mouth daily. 06/18/13  Yes Wilmon Pali, PA-C  omeprazole (PRILOSEC) 20 MG capsule Take 1 capsule by mouth 2 (two) times daily. 11/17/13  Yes Historical Provider, MD  polyethylene glycol (MIRALAX / GLYCOLAX) packet Take 17 g by mouth daily as needed for mild constipation.  06/18/13  Yes Gina L Collins, PA-C  PROAIR HFA 108 (90 BASE) MCG/ACT inhaler Inhale 2 puffs into the lungs 4 (four) times daily as needed for shortness of breath.  01/09/15  Yes Historical Provider, MD  traMADol (ULTRAM) 50 MG tablet Take 1-2 tablets (50-100 mg total) by mouth  every 4 (four) hours as needed for moderate pain. 06/18/13  Yes Wilmon Pali, PA-C  Insulin Pen Needle 29G X MISC 1 box 06/18/13   Gina L Collins, PA-C   BP 156/71 mmHg  Pulse 84  Temp(Src) 98.4 F (36.9 C) (Oral)  Resp 18  Ht 5\' 1"  (1.549 m)  Wt 74.662 kg  BMI 31.12 kg/m2  SpO2 99% Physical Exam  Constitutional: She is oriented to person, place, and time. She appears well-developed and well-nourished. No distress.  HENT:  Head: Normocephalic and atraumatic.  Right Ear: External ear normal.  Mouth/Throat: Oropharynx is clear and moist.  Eyes: Conjunctivae and EOM are normal. Pupils are equal, round, and reactive to light.  Neck: Neck supple.  Cardiovascular: Normal rate, regular rhythm, normal heart sounds and intact distal pulses.   Pulses:      Femoral pulses are 1+ on the right side, and 1+ on the left side.      Dorsalis pedis pulses are 1+ on  the right side, and 1+ on the left side.  Pulmonary/Chest: Effort normal and breath sounds normal.  Abdominal: Soft. Bowel sounds are normal. There is no tenderness.  Musculoskeletal: She exhibits edema and tenderness.       Left ankle: She exhibits swelling and abnormal pulse. Tenderness.       Feet:  Black, necrotic dry gangrene fifth toe of left foot.  Neurological: She is alert and oriented to person, place, and time. No cranial nerve deficit. Coordination normal.  Skin: Skin is warm and dry. Rash noted. She is not diaphoretic. There is erythema.     Significant edema noted over the dorsum of her left foot.  Nursing note and vitals reviewed.   ED Course  Procedures (including critical care time) Labs Review Labs Reviewed  CBC WITH DIFFERENTIAL/PLATELET - Abnormal; Notable for the following:    RBC 3.58 (*)    Hemoglobin 11.2 (*)    HCT 32.8 (*)    All other components within normal limits  SEDIMENTATION RATE - Abnormal; Notable for the following:    Sed Rate 74 (*)    All other components within normal limits    CULTURE, BLOOD (ROUTINE X 2)  CULTURE, BLOOD (ROUTINE X 2)  BASIC METABOLIC PANEL  GLUCOSE, CAPILLARY  BASIC METABOLIC PANEL  HEMOGLOBIN A1C  HIV ANTIBODY (ROUTINE TESTING)  C-REACTIVE PROTEIN  PREALBUMIN  CBC WITH DIFFERENTIAL/PLATELET  FERRITIN  IRON AND TIBC  VITAMIN B12  FOLATE RBC  RETICULOCYTES  I-STAT CG4 LACTIC ACID, ED  I-STAT CG4 LACTIC ACID, ED    Imaging Review Dg Foot Complete Left  07/26/2015  CLINICAL DATA:  Cellulitis with gangrenous fifth digit EXAM: LEFT FOOT - COMPLETE 3+ VIEW COMPARISON:  None. FINDINGS: Frontal, oblique and lateral views were obtained. There is loss of soft tissue in the fifth digit. There is no demonstrable fracture or dislocation. There is no appreciable joint space narrowing. No erosive change or bony destruction. There is a bone island in the medial proximal aspect of the first distal phalanx. There it is a small exostosis arising from the dorsal distal talus. There is a spur arising from the inferior calcaneus. IMPRESSION: Soft tissue loss in the fifth digit. No erosive change or bony destruction. No fracture or dislocation. No appreciable joint space narrowing. Small benign exostosis arising from the dorsal distal talus. There is an inferior calcaneal spur. Electronically Signed   By: Bretta Bang III M.D.   On: 07/26/2015 17:54   I have personally reviewed and evaluated these images and lab results as part of my medical decision-making.   EKG Interpretation None      MDM  50 y.o. female with a history of diabetes and CAD status post CABG in 2015 reasons to the emergency department for evaluation of necrotic left fifth toe and cellulitis of her left foot. On exam the patient has obvious dry gangrene to her fifth digit with surrounding cellulitis over the dorsum of her left foot with associated swelling and tenderness. Distal pulses are faintly palpable, 1+.  Findings were reassuring with normal lactate, no leukocytosis, grossly normal  BMP. X-ray showed significant soft tissue loss in the fifth digit but no erosive or bony destruction, nor dislocation. Antibiotics were given. Her case was discussed with vascular surgery on call who recommended medicine admission with vascular consultation. They've agreed to see the patient at the bedside. While awaiting this consultation the patient was admitted to the hospitalist for further care and assessment.   Final diagnoses:  Gangrene  of toe (HCC)  Cellulitis of foot, left        Francoise Ceo, DO 07/27/15 0003  Laurence Spates, MD 07/27/15 1925

## 2015-07-26 NOTE — ED Notes (Signed)
Pt sent here by her doctor for gangrene in left fifth toe and vascular issues in let leg.

## 2015-07-27 ENCOUNTER — Encounter (HOSPITAL_COMMUNITY)
Admission: EM | Disposition: A | Discharge status: Home / Self Care | Payer: Self-pay | Source: Home / Self Care | Attending: Family Medicine

## 2015-07-27 ENCOUNTER — Encounter (HOSPITAL_COMMUNITY): Payer: Self-pay | Admitting: General Practice

## 2015-07-27 ENCOUNTER — Inpatient Hospital Stay (HOSPITAL_COMMUNITY): Payer: BLUE CROSS/BLUE SHIELD

## 2015-07-27 DIAGNOSIS — I96 Gangrene, not elsewhere classified: Secondary | ICD-10-CM

## 2015-07-27 HISTORY — PX: PERIPHERAL VASCULAR CATHETERIZATION: SHX172C

## 2015-07-27 HISTORY — PX: LOWER EXTREMITY ANGIOGRAM: SHX5508

## 2015-07-27 LAB — CBC
HCT: 33.1 % — ABNORMAL LOW (ref 36.0–46.0)
Hemoglobin: 10.9 g/dL — ABNORMAL LOW (ref 12.0–15.0)
MCH: 30.1 pg (ref 26.0–34.0)
MCHC: 32.9 g/dL (ref 30.0–36.0)
MCV: 91.4 fL (ref 78.0–100.0)
PLATELETS: 218 10*3/uL (ref 150–400)
RBC: 3.62 MIL/uL — AB (ref 3.87–5.11)
RDW: 13 % (ref 11.5–15.5)
WBC: 7.5 10*3/uL (ref 4.0–10.5)

## 2015-07-27 LAB — CBC WITH DIFFERENTIAL/PLATELET
Basophils Absolute: 0 10*3/uL (ref 0.0–0.1)
Basophils Relative: 1 %
Eosinophils Absolute: 0.2 10*3/uL (ref 0.0–0.7)
Eosinophils Relative: 4 %
HCT: 30.7 % — ABNORMAL LOW (ref 36.0–46.0)
Hemoglobin: 10.4 g/dL — ABNORMAL LOW (ref 12.0–15.0)
Lymphocytes Relative: 26 %
Lymphs Abs: 1.6 10*3/uL (ref 0.7–4.0)
MCH: 31.1 pg (ref 26.0–34.0)
MCHC: 33.9 g/dL (ref 30.0–36.0)
MCV: 91.9 fL (ref 78.0–100.0)
Monocytes Absolute: 0.4 10*3/uL (ref 0.1–1.0)
Monocytes Relative: 6 %
Neutro Abs: 4.1 10*3/uL (ref 1.7–7.7)
Neutrophils Relative %: 63 %
Platelets: 202 10*3/uL (ref 150–400)
RBC: 3.34 MIL/uL — ABNORMAL LOW (ref 3.87–5.11)
RDW: 13.2 % (ref 11.5–15.5)
WBC: 6.3 10*3/uL (ref 4.0–10.5)

## 2015-07-27 LAB — GLUCOSE, CAPILLARY
GLUCOSE-CAPILLARY: 202 mg/dL — AB (ref 65–99)
GLUCOSE-CAPILLARY: 89 mg/dL (ref 65–99)
Glucose-Capillary: 119 mg/dL — ABNORMAL HIGH (ref 65–99)
Glucose-Capillary: 81 mg/dL (ref 65–99)
Glucose-Capillary: 189 mg/dL — ABNORMAL HIGH (ref 65–99)
Glucose-Capillary: 125 mg/dL — ABNORMAL HIGH (ref 65–99)

## 2015-07-27 LAB — PREALBUMIN: Prealbumin: 16.7 mg/dL — ABNORMAL LOW (ref 18–38)

## 2015-07-27 LAB — RETICULOCYTES
RBC.: 3.34 MIL/uL — ABNORMAL LOW (ref 3.87–5.11)
Retic Count, Absolute: 63.5 10*3/uL (ref 19.0–186.0)
Retic Ct Pct: 1.9 % (ref 0.4–3.1)

## 2015-07-27 LAB — BASIC METABOLIC PANEL
Anion gap: 9 (ref 5–15)
BUN: 17 mg/dL (ref 6–20)
CO2: 25 mmol/L (ref 22–32)
Calcium: 9 mg/dL (ref 8.9–10.3)
Chloride: 104 mmol/L (ref 101–111)
Creatinine, Ser: 0.73 mg/dL (ref 0.44–1.00)
GFR calc Af Amer: >60 mL/min (ref >60)
GFR calc non Af Amer: >60 mL/min (ref >60)
Glucose, Bld: 237 mg/dL — ABNORMAL HIGH (ref 65–99)
Potassium: 4.3 mmol/L (ref 3.5–5.1)
Sodium: 138 mmol/L (ref 135–145)

## 2015-07-27 LAB — IRON AND TIBC
IRON: 36 ug/dL (ref 28–170)
SATURATION RATIOS: 18 % (ref 10.4–31.8)
TIBC: 200 ug/dL — AB (ref 250–450)
UIBC: 164 ug/dL

## 2015-07-27 LAB — C-REACTIVE PROTEIN: CRP: 1.4 mg/dL — AB (ref <1.0)

## 2015-07-27 LAB — VITAMIN B12: Vitamin B-12: 302 pg/mL (ref 180–914)

## 2015-07-27 LAB — FERRITIN: FERRITIN: 154 ng/mL (ref 11–307)

## 2015-07-27 LAB — CREATININE, SERUM: Creatinine, Ser: 0.6 mg/dL (ref 0.44–1.00)

## 2015-07-27 SURGERY — ABDOMINAL AORTOGRAM

## 2015-07-27 MED ORDER — SODIUM CHLORIDE 0.9 % IV SOLN: 1.0000 mL/kg/h | INTRAVENOUS | Status: AC

## 2015-07-27 MED ORDER — FENTANYL CITRATE (PF) 100 MCG/2ML IJ SOLN
INTRAMUSCULAR | Status: AC
  Filled 2015-07-27: qty 2

## 2015-07-27 MED ORDER — HEPARIN (PORCINE) IN NACL 2-0.9 UNIT/ML-% IJ SOLN
INTRAMUSCULAR | Status: DC | PRN
  Administered 2015-07-27: 1000 mL

## 2015-07-27 MED ORDER — MIDAZOLAM HCL 2 MG/2ML IJ SOLN
INTRAMUSCULAR | Status: DC | PRN
  Administered 2015-07-27: 1 mg via INTRAVENOUS

## 2015-07-27 MED ORDER — LIDOCAINE HCL (PF) 1 % IJ SOLN
INTRAMUSCULAR | Status: DC | PRN
  Administered 2015-07-27: 12 mL

## 2015-07-27 MED ORDER — ADULT MULTIVITAMIN W/MINERALS CH
1.0000 | ORAL_TABLET | Freq: Every day | ORAL | Status: DC
  Administered 2015-07-27 – 2015-07-29 (×3): 1 via ORAL
  Filled 2015-07-27 (×3): qty 1

## 2015-07-27 MED ORDER — ACETAMINOPHEN 325 MG PO TABS: 650.0000 mg | ORAL_TABLET | ORAL | Status: DC | PRN

## 2015-07-27 MED ORDER — MIDAZOLAM HCL 2 MG/2ML IJ SOLN
INTRAMUSCULAR | Status: AC
  Filled 2015-07-27: qty 2

## 2015-07-27 MED ORDER — IODIXANOL 320 MG/ML IV SOLN
INTRAVENOUS | Status: DC | PRN
  Administered 2015-07-27: 130 mL via INTRAVENOUS

## 2015-07-27 MED ORDER — ONDANSETRON HCL 4 MG/2ML IJ SOLN
4.0000 mg | Freq: Four times a day (QID) | INTRAMUSCULAR | Status: DC | PRN
  Administered 2015-07-28 – 2015-07-29 (×2): 4 mg via INTRAVENOUS
  Filled 2015-07-27 (×2): qty 2

## 2015-07-27 MED ORDER — ENOXAPARIN SODIUM 40 MG/0.4ML ~~LOC~~ SOLN
40.0000 mg | SUBCUTANEOUS | Status: DC
  Administered 2015-07-28 – 2015-07-29 (×2): 40 mg via SUBCUTANEOUS
  Filled 2015-07-27 (×2): qty 0.4

## 2015-07-27 MED ORDER — HEPARIN (PORCINE) IN NACL 2-0.9 UNIT/ML-% IJ SOLN
INTRAMUSCULAR | Status: AC
  Filled 2015-07-27: qty 1000

## 2015-07-27 MED ORDER — FENTANYL CITRATE (PF) 100 MCG/2ML IJ SOLN
INTRAMUSCULAR | Status: DC | PRN
  Administered 2015-07-27: 50 ug via INTRAVENOUS

## 2015-07-27 MED ORDER — LIDOCAINE HCL (PF) 1 % IJ SOLN
INTRAMUSCULAR | Status: AC
  Filled 2015-07-27: qty 30

## 2015-07-27 SURGICAL SUPPLY — 12 items
CATH ANGIO 5F PIGTAIL 65CM (CATHETERS) ×2 IMPLANT
CATH CROSS OVER TEMPO 5F (CATHETERS) ×2 IMPLANT
CATH STRAIGHT 5FR 65CM (CATHETERS) ×2 IMPLANT
COVER PRB 48X5XTLSCP FOLD TPE (BAG) IMPLANT
COVER PROBE 5X48 (BAG) ×4
KIT MICROINTRODUCER STIFF 5F (SHEATH) ×2 IMPLANT
KIT PV (KITS) ×4 IMPLANT
SHEATH PINNACLE 5F 10CM (SHEATH) ×2 IMPLANT
SYR MEDRAD MARK V 150ML (SYRINGE) ×4 IMPLANT
TRANSDUCER W/STOPCOCK (MISCELLANEOUS) ×4 IMPLANT
TRAY PV CATH (CUSTOM PROCEDURE TRAY) ×4 IMPLANT
WIRE BENTSON .035X145CM (WIRE) ×2 IMPLANT

## 2015-07-27 NOTE — Progress Notes (Signed)
Pt. Refused a bath stated she had a shower yesterday before coming into the hospital and will like to wait till tomorrow

## 2015-07-27 NOTE — Progress Notes (Addendum)
Left Lower Extremity Vein Map    Left Great Saphenous Vein   Segment Diameter Comment  1. Origin 3mm Multiple branches  2. High Thigh 3.13mm   3. Mid Thigh 1.57mm   4. Low Thigh 2.73mm   5. At Knee 4.22mm   6. High Calf 3.8mm Multiple branches  7. Low Calf 1.68mm Multiple branches  8. Ankle 1.20mm Multiple branches                Left Small Saphenous Vein Not visualized  Incidental finding: There are multiple heterogenous areas of the left groin, suggestive of enlarged inguinal lymph nodes. The largest measures 3cm.  07/27/2015 6:02 PM Gertie Fey, RVT, RDCS, RDMS

## 2015-07-27 NOTE — Progress Notes (Signed)
Initial Nutrition Assessment  DOCUMENTATION CODES:   Not applicable  INTERVENTION:   -MVI daily  NUTRITION DIAGNOSIS:   Increased nutrient needs related to wound healing as evidenced by estimated needs.  GOAL:   Patient will meet greater than or equal to 90% of their needs  MONITOR:   Diet advancement, Labs, Weight trends, Skin, I & O's  REASON FOR ASSESSMENT:   Consult Wound healing  ASSESSMENT:   Isabel [name] is a 50 y.o. female with a past medical history significant for IDDM, CAD s/p CABG x4 2015 who presents with toe gangrene.  Pt admitted with gangrene.   Pt to undergo PROCEDURE on 07/27/15:  1. Ultrasound-guided access to the right common femoral artery 2. Aortogram with bilateral iliac arteriogram 3. Selective catheterization of the left external iliac artery with left lower extremity runoff 4. Retrograde right femoral arteriogram with right lower extremity runoff  Pt sitting in recliner chair at time of visit. She reports good appetite PTA. She reports she generally consumes 6 small meals daily, which she coordinates around her work schedule. She reveals that this method of meal planning has worked effectively for her.   She reports no weight loss. She reports UBW of 145#. She admits to weight gain since having heart surgery approximately 2 years ago.   Pt reports being diagnosed with DM approximately 17 years ago. She reveals variable glycemic control in the past, related to poor tolerance of oral medications. She shares that she believes that her glycemic control has improved greatly over the past 2 years after having heart surgery, due to transitioning to insulin. She confirms home regimen of: Levemir 40 units with breakfast, Levemir 20 units with lunch, Levemir 40 units with supper, and Humalog 10 units as needed. Pt reports she checks CBGS 3 times daily and typically yields readings between 130-150.   Nutrition-Focused physical exam completed. Findings  are no fat depletion, no muscle depletion, and no edema.   Discussed importance of good meal intake and optimal glycemic control to promote wound healing. Pt denies any further questions related to DM management at this time.   Case discussed with RN.   Labs reviewed: CBGS: 91-202. Last Hgb A1c: 12.5 in 2015. Current Hgb A1c pending.   Diet Order:  Diet clear liquid Room service appropriate?: Yes; Fluid consistency:: Thin  Skin:  Wound (see comment) (black, necrotic wound on lt 5th toe)  Last BM:  PTA  Height:   Ht Readings from Last 1 Encounters:  07/26/15 5\' 1"  (1.549 m)    Weight:   Wt Readings from Last 1 Encounters:  07/26/15 164 lb 9.6 oz (74.662 kg)    Ideal Body Weight:  47.7 kg  BMI:  Body mass index is 31.12 kg/(m^2).  Estimated Nutritional Needs:   Kcal:  1650-1850  Protein:  85-100 grams  Fluid:  1.6-1.8 L  EDUCATION NEEDS:   No education needs identified at this time  Isabel [name] A. Mayford Knife, RD, LDN, CDE Pager: [phone] After hours Pager: [phone]

## 2015-07-27 NOTE — Care Management Note (Signed)
Case Management Note  Patient Details  Name: Isabel [name] MRN: [id number] Date of Birth: Apr 22, 1965  Subjective/Objective: 50 y.o F admitted 07/26/2015 with PVD. Scheduled for Arterial Angiogram 07/27/2015. CM consulted by MD to arrange DME/HH as needed. None required as of yet. Pt has chosen [facility] to provide [facility] should she require assistance in her private residence where she lives with her spouse.                    Action/Plan: CM will continue to follow for needs. Please call [facility] to arrange when ready.    Expected Discharge Date:                  Expected Discharge Plan:     In-House Referral:     Discharge planning Services  CM Consult  Post Acute Care Choice:  Durable Medical Equipment (Has RW,W/C, Ramp, BSC) Choice offered to:  Patient  DME Arranged:    DME Agency:     HH Arranged:    HH Agency:  [facility] (this is the agency she would like if [facility] is recommended)  Status of Service:  In process, will continue to follow  Medicare Important Message Given:    Date Medicare IM Given:    Medicare IM give by:    Date Additional Medicare IM Given:    Additional Medicare Important Message give by:     If discussed at Long Length of Stay Meetings, dates discussed:    Additional Comments:  Yvone Neu, RN 07/27/2015, 12:56 PM

## 2015-07-27 NOTE — Progress Notes (Signed)
[facility] PROGRESS NOTE  Isabel [name] KGY:[id number] DOB: December 29, 1965 DOA: 07/26/2015 PCP: [name], MD  Assessment/Plan: Principal Problem:   Gangrene of toe (HCC) - Plan is for arteriogram to further assess - Vascular surgeon consulted and on board.  - pt NPO, after procedure please advance diet to diabetic diet   Active Problems:   DM (diabetes mellitus), type 2, uncontrolled (HCC) - Currently on levemir 20 units SQ daily - diabetic diet once able to eat    S/P CABG x 4 - Aspirin and Statin   Code Status: full Family Communication: No family at bedside Disposition Plan: Pending improvement in condition   Consultants:  Vascular surgery: Josephina Gip  Procedures:  pending  Antibiotics:  Ceftriaxone  Metronidazole  HPI/Subjective: Pt has no new complaints this morning. No acute issues reported overnight.  Objective: Filed Vitals:   07/27/15 0601 07/27/15 0949  BP: 123/51 127/69  Pulse: 86 80  Temp: 98.8 F (37.1 C) 98.6 F (37 C)  Resp: 18 14    Intake/Output Summary (Last 24 hours) at 07/27/15 1105 Last data filed at 07/27/15 0602  Gross per 24 hour  Intake      0 ml  Output    500 ml  Net   -500 ml   Filed Weights   07/26/15 2142  Weight: 74.662 kg (164 lb 9.6 oz)    Exam:   General:  Pt in nad, alert and awake  Cardiovascular: Regular rate and rhythm, no rubs  Respiratory: No increased work of breathing, no wheezes, equal chest rise  Abdomen: Soft, nondistended, nontender  Musculoskeletal: No cyanosis or clubbing   Data Reviewed: Basic Metabolic Panel:  Recent Labs Lab 07/26/15 1805 07/27/15 0601  NA 142 138  K 4.2 4.3  CL 108 104  CO2 26 25  GLUCOSE 87 237*  BUN 20 17  CREATININE 0.84 0.73  CALCIUM 9.5 9.0   Liver Function Tests: No results for input(s): AST, ALT, ALKPHOS, BILITOT, PROT, ALBUMIN in the last 168 hours. No results for input(s): LIPASE, AMYLASE in the last 168 hours. No results for  input(s): AMMONIA in the last 168 hours. CBC:  Recent Labs Lab 07/26/15 1805 07/27/15 0601  WBC 7.4 6.3  NEUTROABS 4.3 4.1  HGB 11.2* 10.4*  HCT 32.8* 30.7*  MCV 91.6 91.9  PLT 225 202   Cardiac Enzymes: No results for input(s): CKTOTAL, CKMB, CKMBINDEX, TROPONINI in the last 168 hours. BNP (last 3 results) No results for input(s): BNP in the last 8760 hours.  ProBNP (last 3 results) No results for input(s): PROBNP in the last 8760 hours.  CBG:  Recent Labs Lab 07/26/15 2152 07/27/15 0807  GLUCAP 91 202*    No results found for this or any previous visit (from the past 240 hour(s)).   Studies: Dg Foot Complete Left  07/26/2015  CLINICAL DATA:  Cellulitis with gangrenous fifth digit EXAM: LEFT FOOT - COMPLETE 3+ VIEW COMPARISON:  None. FINDINGS: Frontal, oblique and lateral views were obtained. There is loss of soft tissue in the fifth digit. There is no demonstrable fracture or dislocation. There is no appreciable joint space narrowing. No erosive change or bony destruction. There is a bone island in the medial proximal aspect of the first distal phalanx. There it is a small exostosis arising from the dorsal distal talus. There is a spur arising from the inferior calcaneus. IMPRESSION: Soft tissue loss in the fifth digit. No erosive change or bony destruction. No fracture or dislocation. No appreciable  joint space narrowing. Small benign exostosis arising from the dorsal distal talus. There is an inferior calcaneal spur. Electronically Signed   By: Bretta Bang III M.D.   On: 07/26/2015 17:54    Scheduled Meds: . aspirin EC  81 mg Oral Daily  . atorvastatin  80 mg Oral q1800  . carvedilol  3.125 mg Oral BID  . cefTRIAXone (ROCEPHIN)  IV  2 g Intravenous Q24H   And  . metroNIDAZOLE  500 mg Oral 3 times per day  . insulin aspart  0-9 Units Subcutaneous 6 times per day  . insulin detemir  20 Units Subcutaneous Daily  . pantoprazole  40 mg Oral Daily   Continuous  Infusions: . sodium chloride 100 mL/hr at 07/27/15 0811    Time spent: > 35 minutes   Penny Pia  [facility] Pager [phone] If 7PM-7AM, please contact night-coverage at [email or web address], password [facility] 07/27/2015, 11:05 AM  LOS: 1 day

## 2015-07-27 NOTE — H&P (View-Only) (Signed)
Vascular Surgery Consultation  Reason for Consult: Gangrene left fifth toe  HPI: Isabel [name] is a 50 y.o. female who presents for evaluation of gangrene left fifth toe. Patient developed blister on left fifth toe about 6-8 weeks ago. This has gradually progressed to dry gangrene. She was seen in the wound center earlier today. She came to the emergency department or pain. She has not had a vascular evaluation. She does have a previous history of claudication in the left calf after walking about 1 block. She has no previous history of nonhealing ulcers infection or gangrene. She has no symptoms in the contralateral right leg. She did have coronary artery bypass grafting in 2015. She quit smoking about 20 years ago. She has type 1 diabetes mellitus. She has had no chills and fever.   Past Medical History  Diagnosis Date  . Coronary artery disease   . Diabetes mellitus without complication (HCC)   . Renal disorder     kidney stones  . CHF (congestive heart failure) [facility])    Past Surgical History  Procedure Laterality Date  . Cardiac stents    . Back surgery    . Kidney stone surgery    . Fallopian tube and 1 ovary removed    . Tonsillectomy    . Coronary artery bypass graft N/A 06/08/2013    Procedure: CORONARY ARTERY BYPASS GRAFTING (CABG) times four using left internal mammary and right saphenous vein.;  Surgeon: Delight Ovens, MD;  Location: [facility] OR;  Service: Open Heart Surgery;  Laterality: N/A;  . Intraoperative transesophageal echocardiogram N/A 06/08/2013    Procedure: INTRAOPERATIVE TRANSESOPHAGEAL ECHOCARDIOGRAM;  Surgeon: Delight Ovens, MD;  Location: [facility] OR;  Service: Open Heart Surgery;  Laterality: N/A;  . Left and right heart catheterization with coronary angiogram N/A 06/07/2013    Procedure: LEFT AND RIGHT HEART CATHETERIZATION WITH CORONARY ANGIOGRAM;  Surgeon: Marykay Lex, MD;  Location: [facility] CATH LAB;  Service: Cardiovascular;  Laterality: N/A;   Social History    Social History  . Marital Status: Married    Spouse Name: N/A  . Number of Children: N/A  . Years of Education: N/A   Social History Main Topics  . Smoking status: Former Smoker -- 0.50 packs/day for 10 years    Types: Cigarettes    Start date: 12/26/1992    Quit date: 04/15/2000  . Smokeless tobacco: Never Used     Comment: QUIT SMOKING YEARS AGO "  . Alcohol Use: 0.0 oz/week    0 Standard drinks or equivalent per week     Comment: occ  . Drug Use: No  . Sexual Activity: Not Asked   Other Topics Concern  . None   Social History Narrative   Family History  Problem Relation Age of Onset  . Heart disease Mother   . Diabetes Mother   . Stroke Mother   . Cancer Father   . Heart disease Father   . Diabetes Father   . Heart disease Brother   . Diabetes Paternal Aunt   . Stroke Maternal Grandmother   . Diabetes Paternal Grandfather    No Known Allergies Prior to Admission medications   Medication Sig Start Date End Date Taking? Authorizing Provider  ALPRAZolam Prudy Feeler) 1 MG tablet Take 1 mg by mouth 4 (four) times daily as needed for anxiety.  05/06/13  Yes Historical Provider, MD  aspirin EC 81 MG tablet Take 81 mg by mouth daily.   Yes Historical Provider, MD  atorvastatin (  LIPITOR) 80 MG tablet Take 1 tablet (80 mg total) by mouth daily at 6 PM. 06/18/13  Yes Gina L Collins, PA-C  carvedilol (COREG) 3.125 MG tablet Take 1 tablet (3.125 mg total) by mouth 2 (two) times daily. 11/30/13  Yes Laqueta Linden, MD  HUMALOG KWIKPEN 100 UNIT/ML KiwkPen Inject 10 Units into the skin daily as needed. Per sliding scale. For high sugar 10/22/13  Yes Historical Provider, MD  HYDROcodone-acetaminophen (NORCO) 10-325 MG tablet Take 1 tablet by mouth every 6 (six) hours as needed for moderate pain.   Yes Historical Provider, MD  Insulin Detemir (LEVEMIR) 100 UNIT/ML Pen Inject 100 Units into the skin See admin instructions. Take 40 units in the morning, then take 20 units in the  afternoon, then take 20 units again to equal out to 100units a day per patient 06/18/13  Yes Wilmon Pali, PA-C  lisinopril (PRINIVIL,ZESTRIL) 5 MG tablet Take 1 tablet (5 mg total) by mouth daily. 06/18/13  Yes Wilmon Pali, PA-C  omeprazole (PRILOSEC) 20 MG capsule Take 1 capsule by mouth 2 (two) times daily. 11/17/13  Yes Historical Provider, MD  polyethylene glycol (MIRALAX / GLYCOLAX) packet Take 17 g by mouth daily as needed for mild constipation.  06/18/13  Yes Gina L Collins, PA-C  PROAIR HFA 108 (90 BASE) MCG/ACT inhaler Inhale 2 puffs into the lungs 4 (four) times daily as needed for shortness of breath.  01/09/15  Yes Historical Provider, MD  traMADol (ULTRAM) 50 MG tablet Take 1-2 tablets (50-100 mg total) by mouth every 4 (four) hours as needed for moderate pain. 06/18/13  Yes Wilmon Pali, PA-C  Insulin Pen Needle 29G X MISC 1 box 06/18/13   Wilmon Pali, PA-C     Positive ROS: Denies chest pain, dyspnea on exertion, PND, orthopnea, hemoptysis, lateralizing weakness, aphasia, amaurosis fugax, diplopia, blurred vision, syncope. Also has history of kidney stones. Has had coronary artery bypass grafting. Denies arrhythmias. Is not on anticoagulants.  All other systems have been reviewed and were otherwise negative with the exception of those mentioned in the HPI and as above.  Physical Exam: Filed Vitals:   07/26/15 2015 07/26/15 2100  BP: 152/80 169/79  Pulse: 79 81  Temp:    Resp:      General: Alert, no acute distress HEENT: Normal for age Cardiovascular: Regular rate and rhythm. Carotid pulses 2+, no bruits audible Respiratory: Clear to auscultation. No cyanosis, no use of accessory musculature GI: No organomegaly, abdomen is soft and non-tender Skin: No lesions in the area of chief complaint Neurologic: Sensation intact distally Psychiatric: Patient is competent for consent with normal mood and affect Musculoskeletal: No obvious deformities Extremities: Right leg with  3+ femoral 2+ popliteal and 2+ dorsalis pedis pulse palpable. Left leg with absent femoral and distal pulses. Dry gangrene distal half of left fifth toe. Mild swelling dorsum of foot and laterally. No fluctuance or purulence drainage.   Imaging reviewed: ABIs apparently done a few weeks ago which revealed 0.42 on the left and 0.87 on the right.   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 Edilia Bo if schedule permits although it could possibly be done on Friday if necessary. Patient may well need inflow and outflow procedure such as possible iliac stent or right to left femoral-femoral bypass and left femoral-popliteal bypass graft. This will be dependent on findings of angiography  Discuss this plan with patient and her husband and they are agreeable Needs to be nothing by mouth tomorrow morning-Thursday after 7 AM   Josephina Gip, MD 07/26/2015 9:22 PM

## 2015-07-27 NOTE — Progress Notes (Signed)
Patient lying in bed, husband at bedside, no needs expressed at this time. Call light within reach.

## 2015-07-27 NOTE — Progress Notes (Signed)
Noted consult for diabetes coordinator. Went to talk with patient but she is currently in Cath Lab. According to the home medication list patient takes: Levemir 40 units with breakfast, Levemir 20 units with lunch, Levemir 40 units with supper, and Humalog 10 units as needed. Patient is currently ordered: Levemir 20 units daily and Novolog 0-9 units Q4H. Current glucose is 119 mg/dl at 83:38 today. Diabetes Coordinator will continue to follow and will attempt to see patient again tomorrow. Thanks, Orlando Penner, RN, MSN, CDE Diabetes Coordinator Inpatient Diabetes Program [phone] (Team Pager from 8am to 5pm) [phone] ([facility] office) [phone] [facility] office) [phone] [facility] office)

## 2015-07-27 NOTE — Progress Notes (Signed)
Utilization review completed.  

## 2015-07-27 NOTE — Op Note (Signed)
   PATIENT: Isabel [name]   MRN: [id number] DOB: [birth year]    DATE OF PROCEDURE: 07/27/2015  INDICATIONS: Isabel [name] is a 50 y.o. female who presented with a gangrenous wound of her left fifth toe. She was set up for arteriography.  PROCEDURE:   1. Ultrasound-guided access to the right common femoral artery 2. Aortogram with bilateral iliac arteriogram 3. Selective catheterization of the left external iliac artery with left lower extremity runoff 4. Retrograde right femoral arteriogram with right lower extremity runoff  SURGEON: Di Kindle. Edilia Bo, MD, FACS  ANESTHESIA: Local with sedation   EBL: minimal  TECHNIQUE: The patient was taken to the peripheral vascular lab. The period of conscious sedation began at (12:54 PM) and ended at (1320 7 PM).  During that time period, I was present face-to-face 100% of the time.  The patient was administered (1 mg of Versed and 50 g of fentanyl.). The patient's heart rate, blood pressure, and oxygen saturation were monitored by the nurse continuously during the procedure.  Both groins were prepped and draped in usual sterile fashion. After the skin was anesthetized with 1 slight K, under ultrasound guidance, the right common femoral artery was cannulated with a micropuncture needle and a micropuncture sheath introduced over the wire. This was exchanged for a 5 [place] sheath over a [employer] wire. A pigtail catheter was positioned at the L1 vertebral body and flush aortogram obtained. The catheter was in position above the aortic bifurcation and oblique iliac projection was obtained. The pigtail catheter was exchanged for a crossover catheter which was positioned into the proximal left common iliac artery. The wire was advanced into the external iliac artery in the crossover catheter exchanged for a straight catheter. Selective left external iliac arteriogram was obtained with left lower extremity runoff. This catheter was then removed and a right  femoral arteriogram was obtained with right lower extremity runoff. At the completion of the procedure, the patient was transferred to the holding area for removal of the sheath. No immediate complications were noted.  FINDINGS:  1. There are 2 renal arteries on the right and a single renal artery on the left. No significant renal artery stenosis is identified. 2. The infrarenal aorta, bilateral common iliac arteries, bilateral hypogastric arteries, and bilateral external iliac arteries are widely patent. 3. On the left side, which is the site of concern, the common femoral and deep femoral artery are patent. The superficial femoral artery is occluded distally.There is reconstitution of a short blind segment in the distal superficial femoral artery and the artery occludes below that. There is an reconstitution of the popliteal artery at the level of the knee. There is single-vessel runoff via the peroneal artery on the left. The anterior tibial and posterior tibial arteries are occluded. Collaterals reconstituted the distal posterior tibial on the left and the dorsalis pedis on the left. 4. On the right side, the common femoral and deep femoral artery are patent. There is mild diffuse disease of the superficial femoral artery and popliteal artery. There is two-vessel runoff on the right via the peroneal and posterior tibial arteries. There is moderate diffuse disease in the proximal right posterior tibial artery. The anterior tibial artery is occluded.  CLINICAL NOTE: the patient is a potential candidate for a left femoral to below knee popliteal artery bypass. I will order a vein map.  Waverly Ferrari, MD, FACS [facility]  DATE OF DICTATION:   07/27/2015

## 2015-07-27 NOTE — Interval H&P Note (Signed)
History and Physical Interval Note:  07/27/2015 12:43 PM  Isabel [name]  has presented today for surgery, with the diagnosis of pvd  The various methods of treatment have been discussed with the patient and family. After consideration of risks, benefits and other options for treatment, the patient has consented to  Procedure(s): Abdominal Aortogram (N/A) Lower Extremity Angiogram as a surgical intervention .  The patient's history has been reviewed, patient examined, no change in status, stable for surgery.  I have reviewed the patient's chart and labs.  Questions were answered to the patient's satisfaction.     Waverly Ferrari

## 2015-07-28 ENCOUNTER — Encounter (HOSPITAL_COMMUNITY): Payer: Self-pay | Admitting: Vascular Surgery

## 2015-07-28 LAB — GLUCOSE, CAPILLARY
GLUCOSE-CAPILLARY: 149 mg/dL — AB (ref 65–99)
GLUCOSE-CAPILLARY: 220 mg/dL — AB (ref 65–99)
Glucose-Capillary: 113 mg/dL — ABNORMAL HIGH (ref 65–99)
Glucose-Capillary: 147 mg/dL — ABNORMAL HIGH (ref 65–99)
Glucose-Capillary: 167 mg/dL — ABNORMAL HIGH (ref 65–99)
Glucose-Capillary: 202 mg/dL — ABNORMAL HIGH (ref 65–99)

## 2015-07-28 LAB — FOLATE RBC
FOLATE, HEMOLYSATE: 325 ng/mL
FOLATE, RBC: 1059 ng/mL (ref >498)
HEMATOCRIT: 30.7 % — AB (ref 34.0–46.6)

## 2015-07-28 LAB — HEMOGLOBIN A1C
Hgb A1c MFr Bld: 9.3 % — ABNORMAL HIGH (ref 4.8–5.6)
Mean Plasma Glucose: 220 mg/dL

## 2015-07-28 LAB — HIV ANTIBODY (ROUTINE TESTING W REFLEX): HIV Screen 4th Generation wRfx: NONREACTIVE

## 2015-07-28 NOTE — Progress Notes (Signed)
[facility] PROGRESS NOTE  Isabel [name] WGN:[id number] DOB: 08/20/1965 DOA: 07/26/2015 PCP: [name], MD  Assessment/Plan: Principal Problem:   Gangrene of toe (HCC) - Plan is for arteriogram to further assess, Vascular surgeon recommending cardiac clearance. Will reach out to her cardiologist next am. Dr. Purvis Sheffield - Vascular surgeon on board and plan is for left femoral to below-knee popliteal artery bypass on W[place]day 08/02/15  - pt NPO, after procedure please advance diet to diabetic diet   Active Problems:   DM (diabetes mellitus), type 2, uncontrolled (HCC) - Currently on levemir 20 units SQ daily - diabetic diet once able to eat    S/P CABG x 4 - Aspirin and Statin   Code Status: full Family Communication: No family at bedside Disposition Plan: Pending improvement in condition   Consultants:  Vascular surgery: Josephina Gip  Procedures:  pending  Antibiotics:  Ceftriaxone  Metronidazole  HPI/Subjective: Pt has no new complaints today  Objective: Filed Vitals:   07/27/15 2000 07/28/15 0359  BP: 150/62 119/55  Pulse: 89 80  Temp:  98 F (36.7 C)  Resp:  20    Intake/Output Summary (Last 24 hours) at 07/28/15 1650 Last data filed at 07/28/15 1230  Gross per 24 hour  Intake    720 ml  Output      0 ml  Net    720 ml   Filed Weights   07/26/15 2142  Weight: 74.662 kg (164 lb 9.6 oz)    Exam:   General:  Pt in nad, alert and awake  Cardiovascular: Regular rate and rhythm, no rubs  Respiratory: No increased work of breathing, no wheezes, equal chest rise  Abdomen: Soft, nondistended, nontender  Musculoskeletal: No cyanosis or clubbing   Data Reviewed: Basic Metabolic Panel:  Recent Labs Lab 07/26/15 1805 07/27/15 0601 07/27/15 1555  NA 142 138  --   K 4.2 4.3  --   CL 108 104  --   CO2 26 25  --   GLUCOSE 87 237*  --   BUN 20 17  --   CREATININE 0.84 0.73 0.60  CALCIUM 9.5 9.0  --    Liver Function Tests: No  results for input(s): AST, ALT, ALKPHOS, BILITOT, PROT, ALBUMIN in the last 168 hours. No results for input(s): LIPASE, AMYLASE in the last 168 hours. No results for input(s): AMMONIA in the last 168 hours. CBC:  Recent Labs Lab 07/26/15 1805 07/27/15 0601 07/27/15 1555  WBC 7.4 6.3 7.5  NEUTROABS 4.3 4.1  --   HGB 11.2* 10.4* 10.9*  HCT 32.8* 30.7*  30.7* 33.1*  MCV 91.6 91.9 91.4  PLT 225 202 218   Cardiac Enzymes: No results for input(s): CKTOTAL, CKMB, CKMBINDEX, TROPONINI in the last 168 hours. BNP (last 3 results) No results for input(s): BNP in the last 8760 hours.  ProBNP (last 3 results) No results for input(s): PROBNP in the last 8760 hours.  CBG:  Recent Labs Lab 07/27/15 2001 07/27/15 2327 07/28/15 0403 07/28/15 0759 07/28/15 1129  GLUCAP 189* 125* 113* 149* 220*    Recent Results (from the past 240 hour(s))  Blood culture (routine x 2)     Status: None (Preliminary result)   Collection Time: 07/26/15  5:22 PM  Result Value Ref Range Status   Specimen Description BLOOD RIGHT ANTECUBITAL  Final   Special Requests BOTTLES DRAWN AEROBIC AND ANAEROBIC 5AA  Final   Culture NO GROWTH 2 DAYS  Final   Report Status PENDING  Incomplete  Blood culture (routine x 2)     Status: None (Preliminary result)   Collection Time: 07/26/15  7:16 PM  Result Value Ref Range Status   Specimen Description BLOOD LEFT ANTECUBITAL  Final   Special Requests BOTTLES DRAWN AEROBIC AND ANAEROBIC 5CC  Final   Culture NO GROWTH 2 DAYS  Final   Report Status PENDING  Incomplete     Studies: Dg Foot Complete Left  07/26/2015  CLINICAL DATA:  Cellulitis with gangrenous fifth digit EXAM: LEFT FOOT - COMPLETE 3+ VIEW COMPARISON:  None. FINDINGS: Frontal, oblique and lateral views were obtained. There is loss of soft tissue in the fifth digit. There is no demonstrable fracture or dislocation. There is no appreciable joint space narrowing. No erosive change or bony destruction. There is  a bone island in the medial proximal aspect of the first distal phalanx. There it is a small exostosis arising from the dorsal distal talus. There is a spur arising from the inferior calcaneus. IMPRESSION: Soft tissue loss in the fifth digit. No erosive change or bony destruction. No fracture or dislocation. No appreciable joint space narrowing. Small benign exostosis arising from the dorsal distal talus. There is an inferior calcaneal spur. Electronically Signed   By: Bretta Bang III M.D.   On: 07/26/2015 17:54    Scheduled Meds: . aspirin EC  81 mg Oral Daily  . atorvastatin  80 mg Oral q1800  . carvedilol  3.125 mg Oral BID  . cefTRIAXone (ROCEPHIN)  IV  2 g Intravenous Q24H   And  . metroNIDAZOLE  500 mg Oral 3 times per day  . enoxaparin (LOVENOX) injection  40 mg Subcutaneous Q24H  . insulin aspart  0-9 Units Subcutaneous 6 times per day  . insulin detemir  20 Units Subcutaneous Daily  . multivitamin with minerals  1 tablet Oral Daily  . pantoprazole  40 mg Oral Daily   Continuous Infusions: . sodium chloride 100 mL/hr at 07/28/15 1644    Time spent: > 35 minutes   Penny Pia  [facility] Pager [phone] If 7PM-7AM, please contact night-coverage at [email or web address], password [facility] 07/28/2015, 4:50 PM  LOS: 2 days

## 2015-07-28 NOTE — Progress Notes (Signed)
Patient lying in bed, Zofran given for nausea. No other needs at this time, call light within reach.

## 2015-07-28 NOTE — Progress Notes (Signed)
Inpatient Diabetes Program Recommendations  AACE/ADA: New Consensus Statement on Inpatient Glycemic Control (2015)  Target Ranges:  Prepandial:   less than 140 mg/dL      Peak postprandial:   less than 180 mg/dL (1-2 hours)      Critically ill patients:  140 - 180 mg/dL   Review of Glycemic Control  Inpatient Diabetes Program Recommendations:  Correction (SSI): change Novolog to TID + HS Thank you  Piedad Climes BSN, RN,CDE Inpatient Diabetes Coordinator [phone] (team pager)

## 2015-07-28 NOTE — Progress Notes (Signed)
Per telemetry, no orders for monitoring on file. Paged physician on call-L [place] and requested an order to be placed if monitoring is needed.

## 2015-07-28 NOTE — Progress Notes (Addendum)
  Progress Note    07/28/2015 8:16 AM 1 Day Post-Op  Subjective:  States her pain is controlled with the Vicodin  Afebrile HR 80's NSR 120's-150's systolic 96% RA  Filed Vitals:   07/27/15 2000 07/28/15 0359  BP: 150/62 119/55  Pulse: 89 80  Temp:  98 F (36.7 C)  Resp:  20    Physical Exam: Cardiac:  regular Lungs:  Non labored Incisions:  Right groin is soft without hematoma Extremities:  Monophasic doppler signals left DP/PT/peroneal & right doppler signals in DP/PT; dry gangrene of left 5th toe; malodorous    CBC    Component Value Date/Time   WBC 7.5 07/27/2015 1555   RBC 3.62* 07/27/2015 1555   RBC 3.34* 07/27/2015 0601   HGB 10.9* 07/27/2015 1555   HCT 33.1* 07/27/2015 1555   PLT 218 07/27/2015 1555   MCV 91.4 07/27/2015 1555   MCH 30.1 07/27/2015 1555   MCHC 32.9 07/27/2015 1555   RDW 13.0 07/27/2015 1555   LYMPHSABS 1.6 07/27/2015 0601   MONOABS 0.4 07/27/2015 0601   EOSABS 0.2 07/27/2015 0601   BASOSABS 0.0 07/27/2015 0601    BMET    Component Value Date/Time   NA 138 07/27/2015 0601   K 4.3 07/27/2015 0601   CL 104 07/27/2015 0601   CO2 25 07/27/2015 0601   GLUCOSE 237* 07/27/2015 0601   BUN 17 07/27/2015 0601   CREATININE 0.60 07/27/2015 1555   CALCIUM 9.0 07/27/2015 0601   GFRNONAA >60 07/27/2015 1555   GFRAA >60 07/27/2015 1555    INR    Component Value Date/Time   INR 1.28 06/08/2013 1300     Intake/Output Summary (Last 24 hours) at 07/28/15 0816 Last data filed at 07/27/15 1700  Gross per 24 hour  Intake    240 ml  Output      0 ml  Net    240 ml     Assessment:  50 y.o. female is s/p:  1. Ultrasound-guided access to the right common femoral artery 2. Aortogram with bilateral iliac arteriogram 3. Selective catheterization of the left external iliac artery with left lower extremity runoff 4. Retrograde right femoral arteriogram with right lower extremity runoff  1 Day Post-Op   Plan: -pt's right groin is soft  without hematoma -+monophasic doppler signals left DP/PT/peroneal -left 5th toes with dry gangrene -pain is well controlled with Vicodin -pt with hx of CABG February 2015.  She saw Dr. Purvis Sheffield in September 2016 at which time she was stable and her LV fxn had normalized and f/u in one year.  Will need cardiology clearance.  Dr. Myra Gianotti spoke with Dr. Mayford Knife and she suggested calling Dr. Purvis Sheffield to get clearance as she was just seen 6 months ago.   Doreatha Massed, PA-C [facility] [phone] 07/28/2015 8:16 AM    I agree with the above.  I seen and evaluated the patient.  She is scheduled for a left femoral to below knee popliteal artery bypass graft on Wednesday.  She does have a history of CABG, and therefore will likely need clearance from her cardiologist, Dr Purvis Sheffield.  Once this is been obtained, she would be okay for discharge from a perspective with plans to return on Wednesday for her bypass.  Durene Cal

## 2015-07-29 DIAGNOSIS — E1152 Type 2 diabetes mellitus with diabetic peripheral angiopathy with gangrene: Secondary | ICD-10-CM

## 2015-07-29 DIAGNOSIS — Z951 Presence of aortocoronary bypass graft: Secondary | ICD-10-CM

## 2015-07-29 DIAGNOSIS — E1165 Type 2 diabetes mellitus with hyperglycemia: Secondary | ICD-10-CM

## 2015-07-29 DIAGNOSIS — I96 Gangrene, not elsewhere classified: Secondary | ICD-10-CM

## 2015-07-29 DIAGNOSIS — Z01810 Encounter for preprocedural cardiovascular examination: Secondary | ICD-10-CM

## 2015-07-29 DIAGNOSIS — I2510 Atherosclerotic heart disease of native coronary artery without angina pectoris: Secondary | ICD-10-CM

## 2015-07-29 DIAGNOSIS — Z794 Long term (current) use of insulin: Secondary | ICD-10-CM

## 2015-07-29 LAB — GLUCOSE, CAPILLARY
GLUCOSE-CAPILLARY: 233 mg/dL — AB (ref 65–99)
Glucose-Capillary: 156 mg/dL — ABNORMAL HIGH (ref 65–99)
Glucose-Capillary: 172 mg/dL — ABNORMAL HIGH (ref 65–99)

## 2015-07-29 MED ORDER — METRONIDAZOLE 500 MG PO TABS: 500.0000 mg | ORAL_TABLET | Freq: Three times a day (TID) | ORAL | Status: DC

## 2015-07-29 MED ORDER — CEFPODOXIME PROXETIL 100 MG PO TABS: 100.0000 mg | ORAL_TABLET | Freq: Two times a day (BID) | ORAL | Status: DC

## 2015-07-29 NOTE — Progress Notes (Signed)
D/c teaching complete including reviewed AVS printout including medications taken/ need to take for the remainder of today, new Rx's, and all questions answered. Nursing tech will assist patient to [place] exit via w/c with her personal belongings - to meet husband to drive home.

## 2015-07-29 NOTE — Progress Notes (Signed)
    Subjective  -   Nausea last night Pain and left fifth toe   Physical Exam:  Vascular: Regular rhythm.  Nonpalpable pulses left foot Pulmonary: Nonlabored respirations  skin: Dry gangrene left fifth toe   Assessment/Plan:  Atherosclerosis with ulcer, left foot   the patient is scheduled for left femoral-popliteal bypass graft for this coming Wednesday.  She sees Dr. Purvis Sheffield with cardiology.  She will need a note from him clearing her for surgery.  We'll correlate this if she goes home over the weekend.    Durene Cal 07/29/2015 7:49 AM --  Filed Vitals:   07/28/15 2154 07/29/15 0432  BP: 132/61 150/68  Pulse: 86 88  Temp: 98.8 F (37.1 C) 98.1 F (36.7 C)  Resp: 18 18    Intake/Output Summary (Last 24 hours) at 07/29/15 0749 Last data filed at 07/28/15 1645  Gross per 24 hour  Intake    720 ml  Output      0 ml  Net    720 ml     Laboratory CBC    Component Value Date/Time   WBC 7.5 07/27/2015 1555   HGB 10.9* 07/27/2015 1555   HCT 33.1* 07/27/2015 1555   HCT 30.7* 07/27/2015 0601   PLT 218 07/27/2015 1555    BMET    Component Value Date/Time   NA 138 07/27/2015 0601   K 4.3 07/27/2015 0601   CL 104 07/27/2015 0601   CO2 25 07/27/2015 0601   GLUCOSE 237* 07/27/2015 0601   BUN 17 07/27/2015 0601   CREATININE 0.60 07/27/2015 1555   CALCIUM 9.0 07/27/2015 0601   GFRNONAA >60 07/27/2015 1555   GFRAA >60 07/27/2015 1555    COAG Lab Results  Component Value Date   INR 1.28 06/08/2013   INR 0.95 06/07/2013   No results found for: PTT  Antibiotics Anti-infectives    Start     Dose/Rate Route Frequency Ordered Stop   07/26/15 2359  cefTRIAXone (ROCEPHIN) 2 g in dextrose 5 % 50 mL IVPB     2 g 100 mL/hr over 30 Minutes Intravenous Every 24 hours 07/26/15 2216     07/26/15 2230  metroNIDAZOLE (FLAGYL) tablet 500 mg     500 mg Oral 3 times per day 07/26/15 2216     07/26/15 1730  piperacillin-tazobactam (ZOSYN) IVPB 3.375 g     3.375  g 100 mL/hr over 30 Minutes Intravenous  Once 07/26/15 1722 07/26/15 1842   07/26/15 1730  vancomycin (VANCOCIN) IVPB 1000 mg/200 mL premix     1,000 mg 200 mL/hr over 60 Minutes Intravenous  Once 07/26/15 1722 07/26/15 1959       V. Charlena Cross, M.D. [facility] Office: [phone] Pager:  [phone]

## 2015-07-29 NOTE — Consult Note (Signed)
CARDIOLOGY CONSULT NOTE   Patient ID: Isabel [name] MRN: [id number] DOB/AGE: 1966/03/06 50 y.o.  Admit date: 07/26/2015  Primary Physician   [name], MD Primary Cardiologist: Dr. Purvis Sheffield Reason for Consultation: cardiac clearance for surgery.   HPI: Isabel [name] is a 50 year old female with a past medical history of CAD s/p CABG in 2015, DM, CHF, HTN, HLD.   She had significant congestive heart failure symptoms with a depressed EF of 20-25% in February 2015. She underwent four-vessel CABG in February 2015. Her follow up echo was in November 2015 that showed an improved EF of 60-65%, with grade 1 diastolic dysfunction. No wall motion abnormalities.   She has done well since surgery, no CHF symptoms. She presented to the ED on 07/26/2015 with complaints of left fifth toe pain, her PCP had previously evaluated the toe. On exam the toe is black, necrotic with swelling, warmth, and erythema of her left foot concerning for cellulitis. X-ray was negative for osteomyelitis. Arteriography on 07/27/2015 showed no significant renal artery stenosis, widely patent infrarenal aorta, bilateral common iliac arteries, bilateral hypogastric arteries, and bilateral external iliac arteries.  The left superficial femoral artery is occluded distally as well as the anterior tibial and posterior tibial arteries area. She will need left femoral to below-knee popliteal artery bypass. This is planned for Wednesday, 08/02/2015.  Cardiology asked to see for clearance for surgery. She is followed outpatient by Dr. Purvis Sheffield who saw her 7 months ago, at that time she had no exertional chest pain or exertional dyspnea.  Doing well from CHF perspective, he discontinued her diuretics. She denied orthopnea and lower extremity edema at that time.   She is fairly active, she works five days a week at [employer]. She does not get any regular exercise, but is able to climb stairs without SOB and preforms all daily  activities without SOB.  She does not smoke, occasional alcohol use.    Past Medical History  Diagnosis Date  . Coronary artery disease   . Diabetes mellitus without complication (HCC)   . Renal disorder     kidney stones  . CHF (congestive heart failure) [facility])      Past Surgical History  Procedure Laterality Date  . Coronary angioplasty with stent placement    . Back surgery    . Kidney stone surgery    . Salpingoophorectomy      "? side"  . Tonsillectomy    . Coronary artery bypass graft N/A 06/08/2013    Procedure: CORONARY ARTERY BYPASS GRAFTING (CABG) times four using left internal mammary and right saphenous vein.;  Surgeon: Delight Ovens, MD;  Location: [facility] OR;  Service: Open Heart Surgery;  Laterality: N/A;  . Intraoperative transesophageal echocardiogram N/A 06/08/2013    Procedure: INTRAOPERATIVE TRANSESOPHAGEAL ECHOCARDIOGRAM;  Surgeon: Delight Ovens, MD;  Location: [facility] OR;  Service: Open Heart Surgery;  Laterality: N/A;  . Left and right heart catheterization with coronary angiogram N/A 06/07/2013    Procedure: LEFT AND RIGHT HEART CATHETERIZATION WITH CORONARY ANGIOGRAM;  Surgeon: Marykay Lex, MD;  Location[facility] CATH LAB;  Service: Cardiovascular;  Laterality: N/A;  . Peripheral vascular catheterization N/A 07/27/2015    Procedure: Abdominal Aortogram;  Surgeon: Chuck Hint, MD;  Location: [facility] INVASIVE CV LAB;  Service: Cardiovascular;  Laterality: N/A;  . Lower extremity angiogram Bilateral 07/27/2015    Procedure: Lower Extremity Angiogram;  Surgeon: Chuck Hint, MD;  Location: [facility] INVASIVE CV LAB;  Service: Cardiovascular;  Laterality: Bilateral;    No Known Allergies  I have reviewed the patient's current medications . aspirin EC  81 mg Oral Daily  . atorvastatin  80 mg Oral q1800  . carvedilol  3.125 mg Oral BID  . cefTRIAXone (ROCEPHIN)  IV  2 g Intravenous Q24H   And  . metroNIDAZOLE  500 mg Oral 3 times per day  . enoxaparin (LOVENOX)  injection  40 mg Subcutaneous Q24H  . insulin aspart  0-9 Units Subcutaneous 6 times per day  . insulin detemir  20 Units Subcutaneous Daily  . multivitamin with minerals  1 tablet Oral Daily  . pantoprazole  40 mg Oral Daily   . sodium chloride 100 mL/hr at 07/29/15 0413     Prior to Admission medications   Medication Sig Start Date End Date Taking? Authorizing Provider  ALPRAZolam Prudy Feeler) 1 MG tablet Take 1 mg by mouth 4 (four) times daily as needed for anxiety.  05/06/13  Yes Historical Provider, MD  aspirin EC 81 MG tablet Take 81 mg by mouth daily.   Yes Historical Provider, MD  atorvastatin (LIPITOR) 80 MG tablet Take 1 tablet (80 mg total) by mouth daily at 6 PM. 06/18/13  Yes Gina L Collins, PA-C  carvedilol (COREG) 3.125 MG tablet Take 1 tablet (3.125 mg total) by mouth 2 (two) times daily. 11/30/13  Yes Laqueta Linden, MD  HUMALOG KWIKPEN 100 UNIT/ML KiwkPen Inject 10 Units into the skin daily as needed. Per sliding scale. For high sugar 10/22/13  Yes Historical Provider, MD  HYDROcodone-acetaminophen (NORCO) 10-325 MG tablet Take 1 tablet by mouth every 6 (six) hours as needed for moderate pain.   Yes Historical Provider, MD  Insulin Detemir (LEVEMIR) 100 UNIT/ML Pen Inject 100 Units into the skin See admin instructions. Take 40 units in the morning, then take 20 units in the afternoon, then take 20 units again to equal out to 100units a day per patient 06/18/13  Yes Wilmon Pali, PA-C  lisinopril (PRINIVIL,ZESTRIL) 5 MG tablet Take 1 tablet (5 mg total) by mouth daily. 06/18/13  Yes Wilmon Pali, PA-C  omeprazole (PRILOSEC) 20 MG capsule Take 1 capsule by mouth 2 (two) times daily. 11/17/13  Yes Historical Provider, MD  polyethylene glycol (MIRALAX / GLYCOLAX) packet Take 17 g by mouth daily as needed for mild constipation.  06/18/13  Yes Gina L Collins, PA-C  PROAIR HFA 108 (90 BASE) MCG/ACT inhaler Inhale 2 puffs into the lungs 4 (four) times daily as needed for shortness of breath.   01/09/15  Yes Historical Provider, MD  traMADol (ULTRAM) 50 MG tablet Take 1-2 tablets (50-100 mg total) by mouth every 4 (four) hours as needed for moderate pain. 06/18/13  Yes Wilmon Pali, PA-C  Insulin Pen Needle 29G X MISC 1 box 06/18/13   Wilmon Pali, PA-C     Social History   Social History  . Marital Status: Married    Spouse Name: N/A  . Number of Children: N/A  . Years of Education: N/A   Occupational History  . Not on file.   Social History Main Topics  . Smoking status: Former Smoker -- 0.50 packs/day for 10 years    Types: Cigarettes    Start date: 12/26/1992    Quit date: 04/15/2000  . Smokeless tobacco: Never Used     Comment: QUIT SMOKING YEARS AGO "  . Alcohol Use: 0.0 oz/week    0 Standard drinks or equivalent per  week     Comment: 07/27/2015 "might have a couple drinks twice/month"  . Drug Use: No  . Sexual Activity: Not on file   Other Topics Concern  . Not on file   Social History Narrative    Family Status  Relation Status Death Age  . Mother Alive   . Father Deceased   . Brother Alive    Family History  Problem Relation Age of Onset  . Heart disease Mother   . Diabetes Mother   . Stroke Mother   . Cancer Father   . Heart disease Father   . Diabetes Father   . Heart disease Brother   . Diabetes Paternal Aunt   . Stroke Maternal Grandmother   . Diabetes Paternal Grandfather      ROS:  Full 14 point review of systems complete and found to be negative unless listed above.  Physical Exam: Blood pressure 150/68, pulse 88, temperature 98.1 F (36.7 C), temperature source Oral, resp. rate 18, height 5\' 1"  (1.549 m), weight 164 lb 9.6 oz (74.662 kg), SpO2 96 %.  General: Well developed, well nourished, female in no acute distress Head: Eyes PERRLA, No xanthomas.   Normocephalic and atraumatic, oropharynx without edema or exudate.  Lungs: CTA Heart: HRRR S1 S2, no rub/gallop, No murmur.  Neck: No carotid bruits. No lymphadenopathy. No  JVD. Abdomen: Bowel sounds present, abdomen soft and non-tender without masses or hernias noted. Msk:  No spine or cva tenderness. No weakness, no joint deformities or effusions. Extremities: No clubbing or cyanosis.  No edema.  Neuro: Alert and oriented X 3. No focal deficits noted. Psych:  Good affect, responds appropriately Skin: No rashes or lesions noted.  Labs:   Lab Results  Component Value Date   WBC 7.5 07/27/2015   HGB 10.9* 07/27/2015   HCT 33.1* 07/27/2015   MCV 91.4 07/27/2015   PLT 218 07/27/2015    Recent Labs Lab 07/27/15 0601 07/27/15 1555  NA 138  --   K 4.3  --   CL 104  --   CO2 25  --   BUN 17  --   CREATININE 0.73 0.60  CALCIUM 9.0  --   GLUCOSE 237*  --     VITAMIN B-12  Date/Time Value Ref Range Status  07/27/2015 06:01 AM 302 180 - 914 pg/mL Final    Comment:    (NOTE) This assay is not validated for testing neonatal or myeloproliferative syndrome specimens for Vitamin B12 levels.    FERRITIN  Date/Time Value Ref Range Status  07/27/2015 06:01 AM 154 11 - 307 ng/mL Final   TIBC  Date/Time Value Ref Range Status  07/27/2015 06:01 AM 200* 250 - 450 ug/dL Final   IRON  Date/Time Value Ref Range Status  07/27/2015 06:01 AM 36 28 - 170 ug/dL Final   RETIC CT PCT  Date/Time Value Ref Range Status  07/27/2015 06:01 AM 1.9 0.4 - 3.1 % Final    Echo: 03/02/2014 - Left ventricle: The cavity size was normal. Wall thickness was normal. Systolic function was normal. The estimated ejection fraction was in the range of 60% to 65%. Doppler parameters are consistent with abnormal left ventricular relaxation (grade 1 diastolic dysfunction). - Aortic valve: Mildly calcified annulus. Trileaflet; mildly thickened leaflets. Valve area (VTI): 2.26 cm^2. Valve area (Vmax): 2.14 cm^2. - Mitral valve: Mildly calcified annulus. Mildly thickened leaflets . - Left atrium: The atrium was mildly dilated. - Right ventricle: The RV is  poorly visualized, it  appears grossly normal in size and function. - Technically difficult study  ECG: Will need one today as none on file since Sept. 2016.   ASSESSMENT AND PLAN:    Principal Problem:   Gangrene of toe (HCC) Active Problems:   DM (diabetes mellitus), type 2, uncontrolled (HCC)   S/P CABG x 4   Gangrene (HCC)  1. Cardiac evaluation for surgery: Patient is in need of left femoral to below-knee popliteal artery bypass.  Surgery is planned for Wednesday 08/02/15.  Last echo reflects normal EF, she has had recent CABG in Feb. 2015.  She has been doing well from a cardiac standpoint, no chest pain with exertion, no SOB.  She is fairly active, citing she would like to be more active once her leg feels better following popliteal artery bypass. She is on beta blocker, this reduces her risk of post operative ischemic event.    Signed: Little Ishikawa, NP 07/29/2015 10:44 AM Pager [phone]  Co-Sign MD  I have seen and examined the patient along with Little Ishikawa, NP.  I have reviewed the chart, notes and new data.  I agree with NP's note.  Key new complaints: no exertional angina or dyspnea, NYHA class I Key examination changes: no arrhythmia or clinical evidence of HF Key new findings / data: ECG pending. Note ECG Sep 2016 showed NSR, lateral T wave inversion  PLAN: Excellent functional capacity. Just over 2 years since CABG. Normal LVEF. If ECG is unchanged, I do not think she will need further cardiac/coronary evaluation before the planned vascular surgery. Low to intermediate risk of complications is related to presence of DM, established CAD and the vascular nature of her procedure. It is important to avoid interruption of her chronic beta blocker therapy.  Thurmon Fair, MD, [facility] [phone] 07/29/2015, 12:17 PM

## 2015-07-29 NOTE — Progress Notes (Signed)
Patient has had two episodes of vomiting clear fluid. Gave her some more Zofran, diet ginger ale and saltines. Will continue to monitor. Call light within reach.

## 2015-07-29 NOTE — Discharge Summary (Signed)
Physician Discharge Summary  Isabel [name]:[id number] DOB: 1965/11/24 DOA: 07/26/2015  PCP: Isabel Flavin, MD  Admit date: 07/26/2015 Discharge date: 07/29/2015  Time spent: >35 minutes  Recommendations for Outpatient Follow-up:  1. Patient is to f/u with vascular surgery for left femoral-popliteal bypass graft for this coming Wednesday.   Discharge Diagnoses:  Principal Problem:   Gangrene of toe (HCC) Active Problems:   DM (diabetes mellitus), type 2, uncontrolled (HCC)   S/P CABG x 4   Gangrene (HCC)   Discharge Condition: stable  Diet recommendation: diabetic  Filed Weights   07/26/15 2142  Weight: 74.662 kg (164 lb 9.6 oz)    History of present illness:  50 y.o. female who presents for evaluation of gangrene left fifth toe. Patient developed blister on left fifth toe about 6-8 weeks ago. This has gradually progressed to dry gangrene  Hospital Course:   Gangrene of toe (HCC) - Please see game plan listed above - Vascular surgeon on board and plan is for left femoral to below-knee popliteal artery bypass on Wenesday 08/02/15  - has been evaluated by cardiology prior to discharge for pre op clearance. I called consult in.  Active Problems:  DM (diabetes mellitus), type 2, uncontrolled (HCC) - Continue prior to admission hypoglycemic regimen - Pt to monitor her CBG's daily   S/P CABG x 4 - Aspirin and Statin  Procedures:  Arteriogram  Consultations:  Vascular surgery  Cardiology  Discharge Exam: Filed Vitals:   07/29/15 0432 07/29/15 1354  BP: 150/68 149/72  Pulse: 88 94  Temp: 98.1 F (36.7 C) 98.6 F (37 C)  Resp: 18 18    General: Pt in nad, alert and awake Cardiovascular: rrr, no mrg Respiratory: cta bl, no wheezes  Discharge Instructions   Discharge Instructions    Diet - low sodium heart healthy    Complete by:  As directed      Discharge instructions    Complete by:  As directed   Please be sure to follow up with vascular  surgery for further evaluation and recommendations.     Increase activity slowly    Complete by:  As directed           Current Discharge Medication List    START taking these medications   Details  cefpodoxime (VANTIN) 100 MG tablet Take 1 tablet (100 mg total) by mouth 2 (two) times daily. Qty: 10 tablet, Refills: 0    metroNIDAZOLE (FLAGYL) 500 MG tablet Take 1 tablet (500 mg total) by mouth every 8 (eight) hours. Qty: 15 tablet, Refills: 0      CONTINUE these medications which have NOT CHANGED   Details  ALPRAZolam (XANAX) 1 MG tablet Take 1 mg by mouth 4 (four) times daily as needed for anxiety.     aspirin EC 81 MG tablet Take 81 mg by mouth daily.    atorvastatin (LIPITOR) 80 MG tablet Take 1 tablet (80 mg total) by mouth daily at 6 PM. Qty: 30 tablet, Refills: 1    carvedilol (COREG) 3.125 MG tablet Take 1 tablet (3.125 mg total) by mouth 2 (two) times daily. Qty: 60 tablet, Refills: 6    HUMALOG KWIKPEN 100 UNIT/ML KiwkPen Inject 10 Units into the skin daily as needed. Per sliding scale. For high sugar    HYDROcodone-acetaminophen (NORCO) 10-325 MG tablet Take 1 tablet by mouth every 6 (six) hours as needed for moderate pain.    Insulin Detemir (LEVEMIR) 100 UNIT/ML Pen Inject 100 Units into the  skin See admin instructions. Take 40 units in the morning, then take 20 units in the afternoon, then take 20 units again to equal out to 100units a day per patient    lisinopril (PRINIVIL,ZESTRIL) 5 MG tablet Take 1 tablet (5 mg total) by mouth daily. Qty: 30 tablet, Refills: 1    omeprazole (PRILOSEC) 20 MG capsule Take 1 capsule by mouth 2 (two) times daily.    polyethylene glycol (MIRALAX / GLYCOLAX) packet Take 17 g by mouth daily as needed for mild constipation.     PROAIR HFA 108 (90 BASE) MCG/ACT inhaler Inhale 2 puffs into the lungs 4 (four) times daily as needed for shortness of breath.     traMADol (ULTRAM) 50 MG tablet Take 1-2 tablets (50-100 mg total) by  mouth every 4 (four) hours as needed for moderate pain. Qty: 30 tablet, Refills: 1    Insulin Pen Needle 29G X MISC 1 box Qty: 1 each, Refills: 2       No Known Allergies Follow-up Information    Follow up with [facility].   Contact information:   [address] [place] [address] [phone]        The results of significant diagnostics from this hospitalization (including imaging, microbiology, ancillary and laboratory) are listed below for reference.    Significant Diagnostic Studies: Dg Foot Complete Left  07/26/2015  CLINICAL DATA:  Cellulitis with gangrenous fifth digit EXAM: LEFT FOOT - COMPLETE 3+ VIEW COMPARISON:  None. FINDINGS: Frontal, oblique and lateral views were obtained. There is loss of soft tissue in the fifth digit. There is no demonstrable fracture or dislocation. There is no appreciable joint space narrowing. No erosive change or bony destruction. There is a bone island in the medial proximal aspect of the first distal phalanx. There it is a small exostosis arising from the dorsal distal talus. There is a spur arising from the inferior calcaneus. IMPRESSION: Soft tissue loss in the fifth digit. No erosive change or bony destruction. No fracture or dislocation. No appreciable joint space narrowing. Small benign exostosis arising from the dorsal distal talus. There is an inferior calcaneal spur. Electronically Signed   By: Bretta Bang III M.D.   On: 07/26/2015 17:54    Microbiology: Recent Results (from the past 240 hour(s))  Blood culture (routine x 2)     Status: None (Preliminary result)   Collection Time: 07/26/15  5:22 PM  Result Value Ref Range Status   Specimen Description BLOOD RIGHT ANTECUBITAL  Final   Special Requests BOTTLES DRAWN AEROBIC AND ANAEROBIC 5AA  Final   Culture NO GROWTH 2 DAYS  Final   Report Status PENDING  Incomplete  Blood culture (routine x 2)     Status: None (Preliminary result)   Collection  Time: 07/26/15  7:16 PM  Result Value Ref Range Status   Specimen Description BLOOD LEFT ANTECUBITAL  Final   Special Requests BOTTLES DRAWN AEROBIC AND ANAEROBIC 5CC  Final   Culture NO GROWTH 2 DAYS  Final   Report Status PENDING  Incomplete     Labs: Basic Metabolic Panel:  Recent Labs Lab 07/26/15 1805 07/27/15 0601 07/27/15 1555  NA 142 138  --   K 4.2 4.3  --   CL 108 104  --   CO2 26 25  --   GLUCOSE 87 237*  --   BUN 20 17  --   CREATININE 0.84 0.73 0.60  CALCIUM 9.5 9.0  --  Liver Function Tests: No results for input(s): AST, ALT, ALKPHOS, BILITOT, PROT, ALBUMIN in the last 168 hours. No results for input(s): LIPASE, AMYLASE in the last 168 hours. No results for input(s): AMMONIA in the last 168 hours. CBC:  Recent Labs Lab 07/26/15 1805 07/27/15 0601 07/27/15 1555  WBC 7.4 6.3 7.5  NEUTROABS 4.3 4.1  --   HGB 11.2* 10.4* 10.9*  HCT 32.8* 30.7*  30.7* 33.1*  MCV 91.6 91.9 91.4  PLT 225 202 218   Cardiac Enzymes: No results for input(s): CKTOTAL, CKMB, CKMBINDEX, TROPONINI in the last 168 hours. BNP: BNP (last 3 results) No results for input(s): BNP in the last 8760 hours.  ProBNP (last 3 results) No results for input(s): PROBNP in the last 8760 hours.  CBG:  Recent Labs Lab 07/28/15 2011 07/28/15 2351 07/29/15 0417 07/29/15 0804 07/29/15 1137  GLUCAP 167* 147* 156* 172* 233*    Signed:  Penny Pia MD.  [facility] 07/29/2015, 3:06 PM

## 2015-07-31 LAB — CULTURE, BLOOD (ROUTINE X 2)
Culture: NO GROWTH
Culture: NO GROWTH

## 2015-08-01 ENCOUNTER — Other Ambulatory Visit: Payer: Self-pay

## 2015-08-01 IMAGING — CR DG CHEST 2V
2 series · 2 of 2 positions shown · non-contrast
Comparison: DG CHEST 1V PORT dated 06/13/2013

CLINICAL DATA: Post CABG, chest discomfort

EXAM:
CHEST  2 VIEW

[w chest pa]
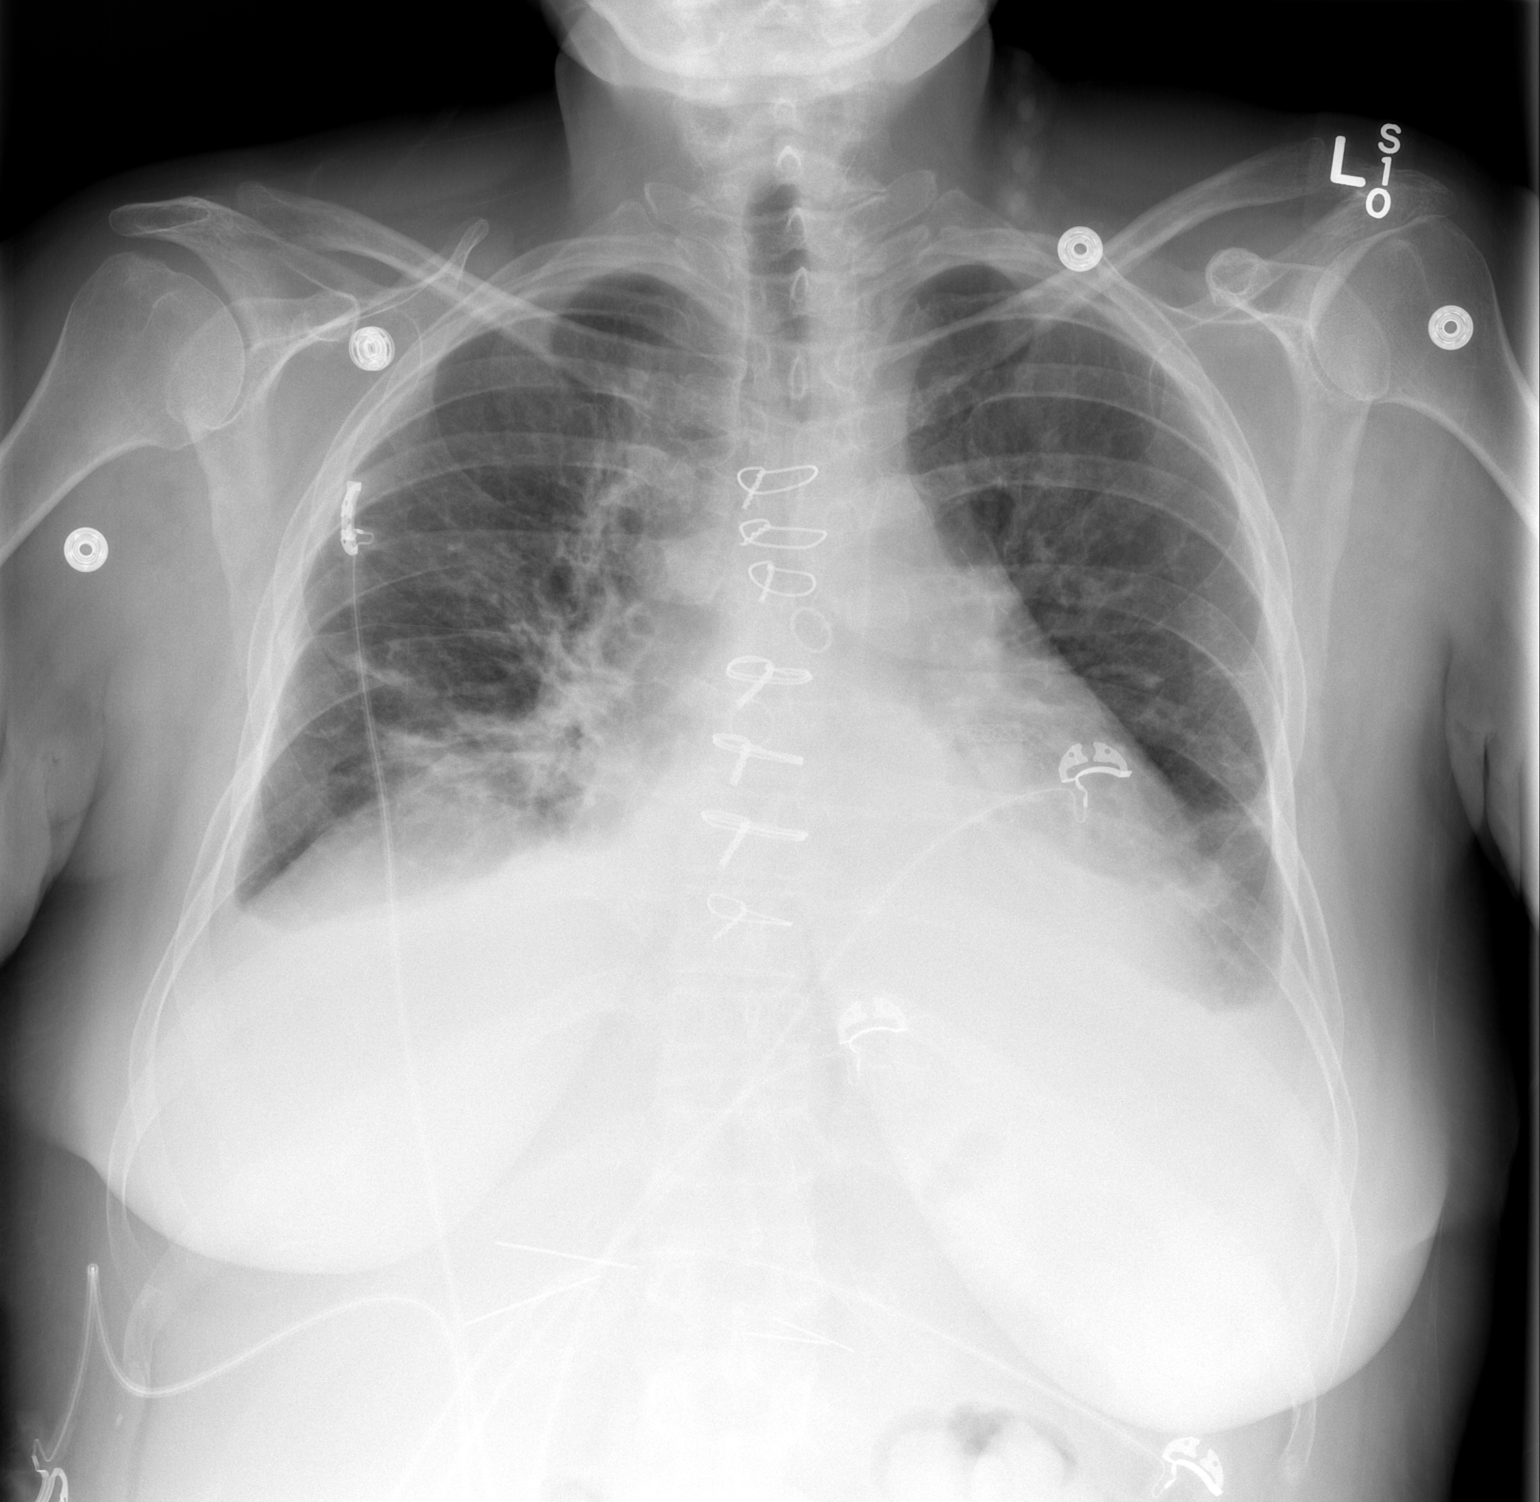

[w chest lat]
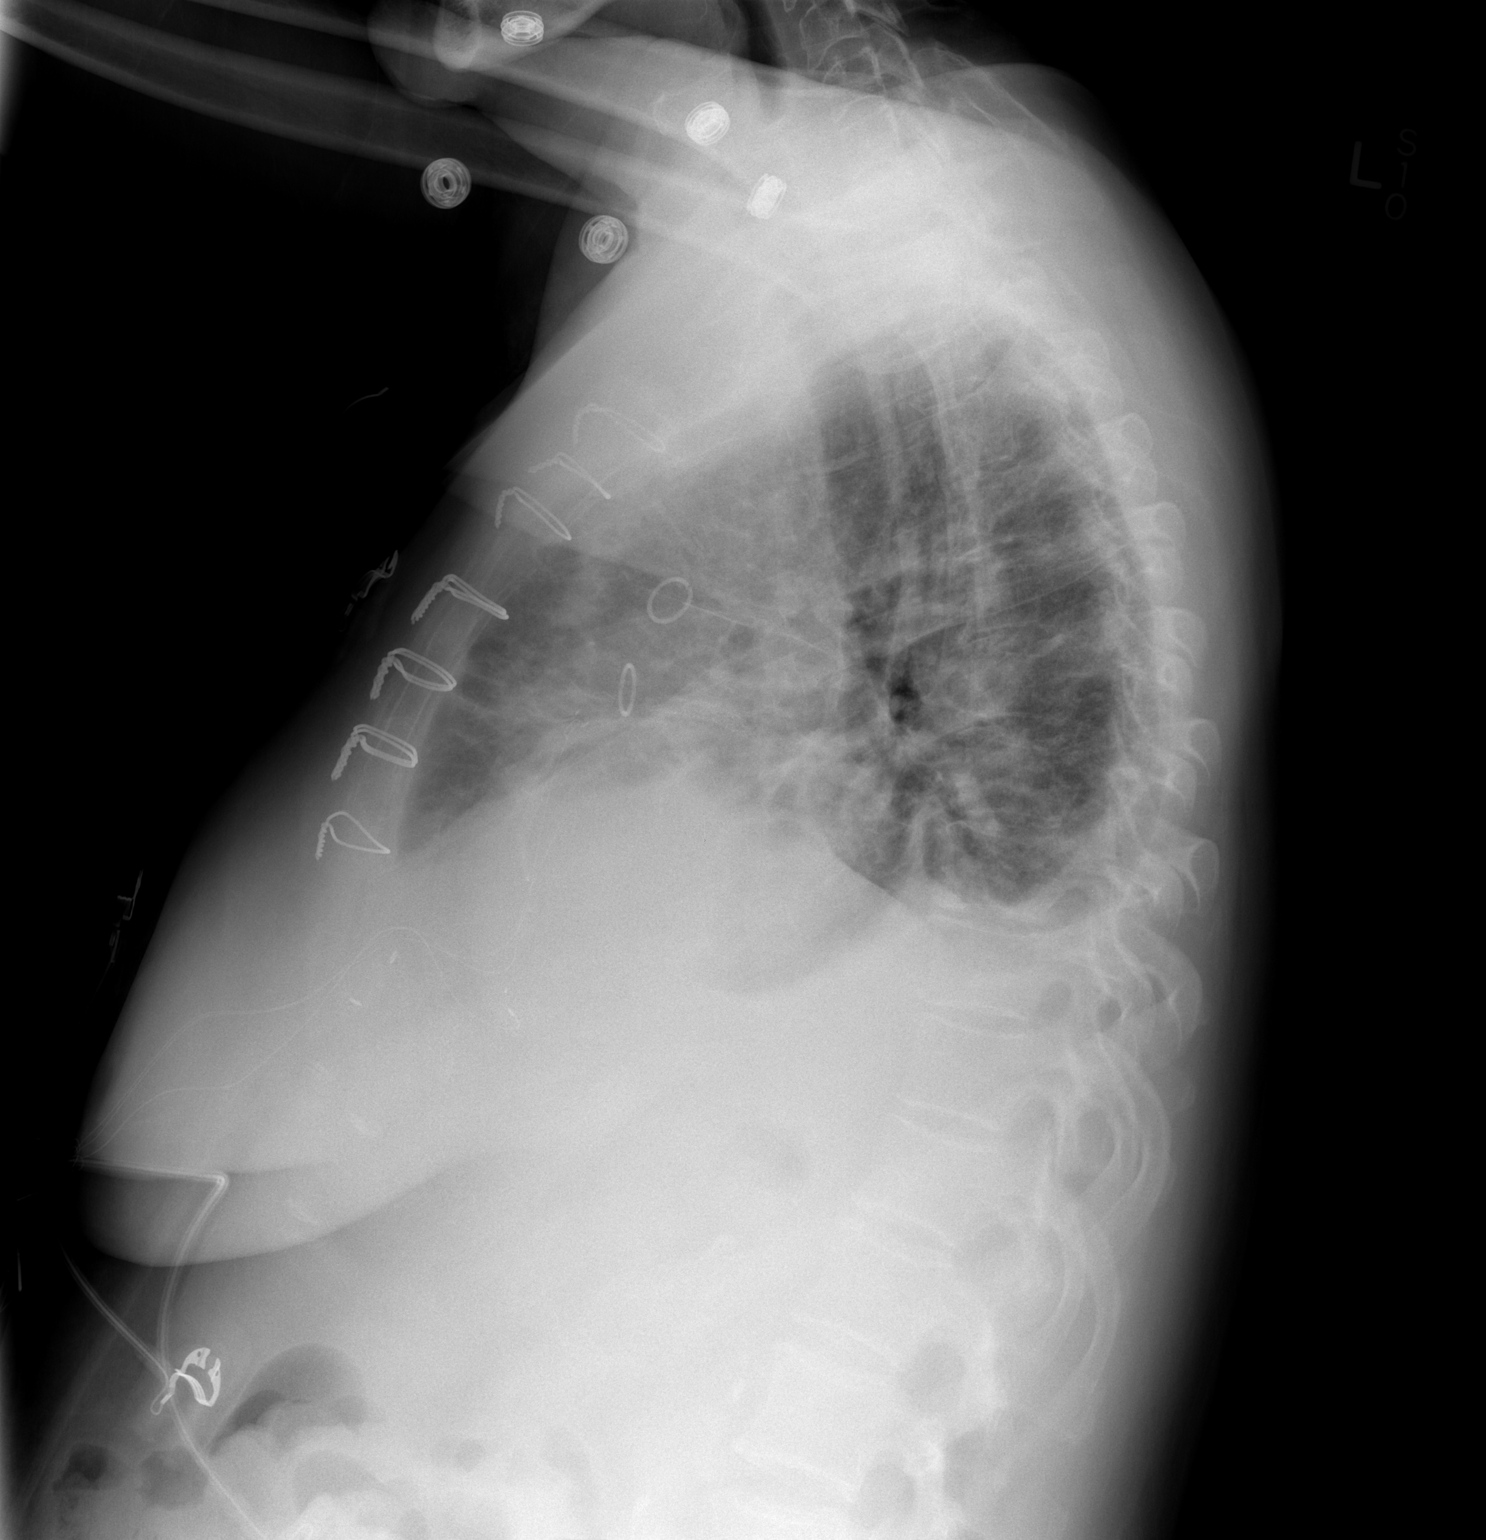

[2 of 2 positions shown; findings below may reference images not displayed]

FINDINGS: Status post CABG. Coronary stent noted, stable. Stable mild cardiac
enlargement. Moderate bilateral pleural effusions, appear stable on
the left and possibly minimally larger on the right. Bilateral lower
lobe consolidation may be due to atelectasis. No vascular
congestion, pulmonary edema, or pneumothorax.
IMPRESSION: Moderate bilateral pleural effusions with underlying consolidation
likely atelectasis, similar to the prior study.

## 2015-08-02 ENCOUNTER — Encounter (HOSPITAL_COMMUNITY): Payer: Self-pay | Admitting: *Deleted

## 2015-08-02 NOTE — Progress Notes (Signed)
Anesthesia Chart Review:  Pt is a 50 year old female scheduled for L fem-below knee popliteal bypass graft on 08/03/2015 with Dr. Imogene Burn.   Pt is a same day work up.   Cardiologist is Dr. Prentice Docker.   PMH includes:  CAD (s/p CABG 06/08/13), CHF, DM. Former smoker. BMI 31  Pt hospitalized 4/12-4/15/17 for gangrene of toe, uncontrolled DM.   Medications include: ASA, lipitor, carvedilol, cefpodoxime, humalog, levemir, lisinopril, prilosec, albuterol.   Labs 07/27/15 reviewed. Glucose 237, hgbA1c 9.3. PT, PTT will be obtained DOS.   EKG 07/29/15: NSR. ST & T wave abnormality, consider lateral ischemia. No significant change since 06/09/13.   Echo 03/02/14:  - Left ventricle: The cavity size was normal. Wall thickness was normal. Systolic function was normal. The estimated ejection fraction was in the range of 60% to 65%. Doppler parameters areconsistent with abnormal left ventricular relaxation (grade 1diastolic dysfunction). - Aortic valve: Mildly calcified annulus. Trileaflet; mildlythickened leaflets. Valve area (VTI): 2.26 cm^2. Valve area(Vmax): 2.14 cm^2. - Mitral valve: Mildly calcified annulus. Mildly thickened leaflets. - Left atrium: The atrium was mildly dilated. - Right ventricle: The RV is poorly visualized, it appears grosslynormal in size and function. - Technically difficult study  Pt saw Dr. Rachelle Hora Croitoru 07/29/15 during hospitalization for pre-op eval; he cleared her for surgery pending EKG showed no changes.  EKG appears stable.   If labs acceptable DOS, I anticipate pt can proceed with surgery as scheduled.   Rica Mast, FNP-BC [facility] Short Stay Surgical Center/Anesthesiology Phone: [phone] 08/02/2015 10:09 AM

## 2015-08-02 NOTE — Progress Notes (Addendum)
Pt has CAD (CABG done), she denies any recent chest pain or sob. Pt is diabetic. Last A1C was 9.3 on 07/26/15. States her fasting blood sugar usually is around 130-180, she states that this AM it was 280. She states she had a rough night, only slept 2 hours and has been in a lot of pain. Pt was instructed by Dr. Nicky Pugh office to take 1/2 of her regular dose of Levemir Insulin this PM and none in the AM. I instructed pt to check her blood sugar in the AM (every 2 hours until she leaves for the hospital) and if her blood sugar is 70 or less to treat with a 1/2 cup (4 oz) of clear juice (apple or cranberry). If she does have to drink the juice, instructed her to recheck blood sugar 15 minutes after drinking the juice. Gave her the number to Short Stay in case blood sugar does not come up over 70 and instructed her to call and speak with a nurse. She voiced understanding. These instructions given per Diabetes Medication Adjustment Guidelines Prior to Procedure and Surgery.  Cath - 06/07/13 -in EPIC Echo - 03/02/14 - in EPIC EKG - 07/29/15 - in [facility]

## 2015-08-03 ENCOUNTER — Encounter (HOSPITAL_COMMUNITY)
Admission: RE | Disposition: A | Discharge status: Home Health | Payer: Self-pay | Source: Ambulatory Visit | Attending: Vascular Surgery

## 2015-08-03 ENCOUNTER — Inpatient Hospital Stay (HOSPITAL_COMMUNITY): Payer: BLUE CROSS/BLUE SHIELD | Admitting: Emergency Medicine

## 2015-08-03 ENCOUNTER — Encounter (HOSPITAL_COMMUNITY): Payer: Self-pay | Admitting: *Deleted

## 2015-08-03 ENCOUNTER — Inpatient Hospital Stay (HOSPITAL_COMMUNITY)
Admission: RE | Admit: 2015-08-03 | Discharge: 2015-08-07 | DRG: 271 | Disposition: A | Discharge status: Home Health | Payer: BLUE CROSS/BLUE SHIELD | Source: Ambulatory Visit | Attending: Vascular Surgery | Admitting: Vascular Surgery

## 2015-08-03 DIAGNOSIS — E1069 Type 1 diabetes mellitus with other specified complication: Secondary | ICD-10-CM | POA: Diagnosis present

## 2015-08-03 DIAGNOSIS — I2510 Atherosclerotic heart disease of native coronary artery without angina pectoris: Secondary | ICD-10-CM | POA: Diagnosis present

## 2015-08-03 DIAGNOSIS — M79672 Pain in left foot: Secondary | ICD-10-CM | POA: Diagnosis present

## 2015-08-03 DIAGNOSIS — Z951 Presence of aortocoronary bypass graft: Secondary | ICD-10-CM | POA: Diagnosis not present

## 2015-08-03 DIAGNOSIS — Z794 Long term (current) use of insulin: Secondary | ICD-10-CM | POA: Diagnosis not present

## 2015-08-03 DIAGNOSIS — E1052 Type 1 diabetes mellitus with diabetic peripheral angiopathy with gangrene: Principal | ICD-10-CM | POA: Diagnosis present

## 2015-08-03 DIAGNOSIS — K219 Gastro-esophageal reflux disease without esophagitis: Secondary | ICD-10-CM | POA: Diagnosis present

## 2015-08-03 DIAGNOSIS — Z833 Family history of diabetes mellitus: Secondary | ICD-10-CM | POA: Diagnosis not present

## 2015-08-03 DIAGNOSIS — F419 Anxiety disorder, unspecified: Secondary | ICD-10-CM | POA: Diagnosis present

## 2015-08-03 DIAGNOSIS — L97522 Non-pressure chronic ulcer of other part of left foot with fat layer exposed: Secondary | ICD-10-CM | POA: Diagnosis present

## 2015-08-03 DIAGNOSIS — Z7982 Long term (current) use of aspirin: Secondary | ICD-10-CM | POA: Diagnosis not present

## 2015-08-03 DIAGNOSIS — Z955 Presence of coronary angioplasty implant and graft: Secondary | ICD-10-CM

## 2015-08-03 DIAGNOSIS — Z8249 Family history of ischemic heart disease and other diseases of the circulatory system: Secondary | ICD-10-CM

## 2015-08-03 DIAGNOSIS — D62 Acute posthemorrhagic anemia: Secondary | ICD-10-CM | POA: Diagnosis not present

## 2015-08-03 DIAGNOSIS — I96 Gangrene, not elsewhere classified: Secondary | ICD-10-CM

## 2015-08-03 DIAGNOSIS — M869 Osteomyelitis, unspecified: Secondary | ICD-10-CM | POA: Diagnosis present

## 2015-08-03 DIAGNOSIS — E1065 Type 1 diabetes mellitus with hyperglycemia: Secondary | ICD-10-CM | POA: Diagnosis present

## 2015-08-03 DIAGNOSIS — I509 Heart failure, unspecified: Secondary | ICD-10-CM | POA: Diagnosis present

## 2015-08-03 DIAGNOSIS — I739 Peripheral vascular disease, unspecified: Secondary | ICD-10-CM | POA: Diagnosis present

## 2015-08-03 DIAGNOSIS — Z87891 Personal history of nicotine dependence: Secondary | ICD-10-CM

## 2015-08-03 DIAGNOSIS — I743 Embolism and thrombosis of arteries of the lower extremities: Secondary | ICD-10-CM | POA: Diagnosis present

## 2015-08-03 DIAGNOSIS — E10621 Type 1 diabetes mellitus with foot ulcer: Secondary | ICD-10-CM | POA: Diagnosis present

## 2015-08-03 HISTORY — PX: PATCH ANGIOPLASTY: SHX6230

## 2015-08-03 HISTORY — DX: Anxiety disorder, unspecified: F41.9

## 2015-08-03 HISTORY — PX: ENDARTERECTOMY FEMORAL: SHX5804

## 2015-08-03 HISTORY — DX: Peripheral vascular disease, unspecified: I73.9

## 2015-08-03 HISTORY — DX: Gastro-esophageal reflux disease without esophagitis: K21.9

## 2015-08-03 HISTORY — PX: AMPUTATION TOE: SHX6595

## 2015-08-03 HISTORY — PX: FEMORAL-POPLITEAL BYPASS GRAFT: SHX937

## 2015-08-03 LAB — COMPREHENSIVE METABOLIC PANEL
ALT: 57 U/L — ABNORMAL HIGH (ref 14–54)
AST: 24 U/L (ref 15–41)
Albumin: 2.4 g/dL — ABNORMAL LOW (ref 3.5–5.0)
Alkaline Phosphatase: 88 U/L (ref 38–126)
Anion gap: 11 (ref 5–15)
BUN: 21 mg/dL — ABNORMAL HIGH (ref 6–20)
CHLORIDE: 105 mmol/L (ref 101–111)
CO2: 22 mmol/L (ref 22–32)
Calcium: 9.3 mg/dL (ref 8.9–10.3)
Creatinine, Ser: 0.7 mg/dL (ref 0.44–1.00)
Glucose, Bld: 276 mg/dL — ABNORMAL HIGH (ref 65–99)
POTASSIUM: 4.8 mmol/L (ref 3.5–5.1)
Sodium: 138 mmol/L (ref 135–145)
TOTAL PROTEIN: 5.5 g/dL — AB (ref 6.5–8.1)
Total Bilirubin: 0.5 mg/dL (ref 0.3–1.2)

## 2015-08-03 LAB — CBC
HCT: 29.3 % — ABNORMAL LOW (ref 36.0–46.0)
HEMOGLOBIN: 9.4 g/dL — AB (ref 12.0–15.0)
MCH: 29.7 pg (ref 26.0–34.0)
MCHC: 32.1 g/dL (ref 30.0–36.0)
MCV: 92.4 fL (ref 78.0–100.0)
Platelets: 213 10*3/uL (ref 150–400)
RBC: 3.17 MIL/uL — ABNORMAL LOW (ref 3.87–5.11)
RDW: 13.3 % (ref 11.5–15.5)
WBC: 13.6 10*3/uL — ABNORMAL HIGH (ref 4.0–10.5)

## 2015-08-03 LAB — PROTIME-INR
INR: 1.1 (ref 0.00–1.49)
PROTHROMBIN TIME: 14.4 s (ref 11.6–15.2)

## 2015-08-03 LAB — URINALYSIS, ROUTINE W REFLEX MICROSCOPIC
GLUCOSE, UA: 100 mg/dL — AB
KETONES UR: 15 mg/dL — AB
Nitrite: NEGATIVE
PH: 6 (ref 5.0–8.0)
Specific Gravity, Urine: 1.037 — ABNORMAL HIGH (ref 1.005–1.030)

## 2015-08-03 LAB — SURGICAL PCR SCREEN
MRSA, PCR: NEGATIVE
Staphylococcus aureus: NEGATIVE

## 2015-08-03 LAB — GLUCOSE, CAPILLARY
GLUCOSE-CAPILLARY: 237 mg/dL — AB (ref 65–99)
Glucose-Capillary: 242 mg/dL — ABNORMAL HIGH (ref 65–99)
Glucose-Capillary: 161 mg/dL — ABNORMAL HIGH (ref 65–99)
Glucose-Capillary: 147 mg/dL — ABNORMAL HIGH (ref 65–99)

## 2015-08-03 LAB — URINE MICROSCOPIC-ADD ON

## 2015-08-03 LAB — CREATININE, SERUM
CREATININE: 0.81 mg/dL (ref 0.44–1.00)
GFR calc Af Amer: >60 mL/min (ref >60)
GFR calc non Af Amer: >60 mL/min (ref >60)

## 2015-08-03 LAB — TYPE AND SCREEN
ABO/RH(D): A NEG
ANTIBODY SCREEN: NEGATIVE

## 2015-08-03 LAB — APTT: APTT: 28 s (ref 24–37)

## 2015-08-03 SURGERY — BYPASS GRAFT FEMORAL-POPLITEAL ARTERY
Anesthesia: General | Site: Toe | Laterality: Left

## 2015-08-03 MED ORDER — DEXTROSE 5 % IV SOLN
1.5000 g | INTRAVENOUS | Status: AC
  Administered 2015-08-03 (×2): 1.5 g via INTRAVENOUS
  Filled 2015-08-03: qty 1.5

## 2015-08-03 MED ORDER — ALUM & MAG HYDROXIDE-SIMETH 200-200-20 MG/5ML PO SUSP: 15.0000 mL | ORAL | Status: DC | PRN

## 2015-08-03 MED ORDER — HYDROCODONE-ACETAMINOPHEN 10-325 MG PO TABS: 1.0000 | ORAL_TABLET | Freq: Four times a day (QID) | ORAL | Status: DC | PRN

## 2015-08-03 MED ORDER — LISINOPRIL 5 MG PO TABS
5.0000 mg | ORAL_TABLET | Freq: Every day | ORAL | Status: DC
  Administered 2015-08-03 – 2015-08-07 (×5): 5 mg via ORAL
  Filled 2015-08-03 (×5): qty 1

## 2015-08-03 MED ORDER — FENTANYL CITRATE (PF) 250 MCG/5ML IJ SOLN
INTRAMUSCULAR | Status: AC
  Filled 2015-08-03: qty 5

## 2015-08-03 MED ORDER — CHLORHEXIDINE GLUCONATE CLOTH 2 % EX PADS: 6.0000 | MEDICATED_PAD | Freq: Once | CUTANEOUS | Status: DC

## 2015-08-03 MED ORDER — BISACODYL 10 MG RE SUPP: 10.0000 mg | Freq: Every day | RECTAL | Status: DC | PRN

## 2015-08-03 MED ORDER — PROTAMINE SULFATE 10 MG/ML IV SOLN
INTRAVENOUS | Status: AC
  Filled 2015-08-03: qty 10

## 2015-08-03 MED ORDER — FENTANYL CITRATE (PF) 100 MCG/2ML IJ SOLN
INTRAMUSCULAR | Status: DC | PRN
  Administered 2015-08-03 (×2): 50 ug via INTRAVENOUS
  Administered 2015-08-03: 100 ug via INTRAVENOUS
  Administered 2015-08-03 (×3): 50 ug via INTRAVENOUS

## 2015-08-03 MED ORDER — LACTATED RINGERS IV SOLN
INTRAVENOUS | Status: DC
  Administered 2015-08-03 (×3): via INTRAVENOUS

## 2015-08-03 MED ORDER — PROTAMINE SULFATE 10 MG/ML IV SOLN
INTRAVENOUS | Status: DC | PRN
  Administered 2015-08-03: 10 mg via INTRAVENOUS
  Administered 2015-08-03 (×3): 20 mg via INTRAVENOUS

## 2015-08-03 MED ORDER — MIDAZOLAM HCL 2 MG/2ML IJ SOLN
INTRAMUSCULAR | Status: AC
  Filled 2015-08-03: qty 2

## 2015-08-03 MED ORDER — SODIUM CHLORIDE 0.9 % IV SOLN: INTRAVENOUS | Status: DC

## 2015-08-03 MED ORDER — ONDANSETRON HCL 4 MG/2ML IJ SOLN
4.0000 mg | Freq: Four times a day (QID) | INTRAMUSCULAR | Status: DC | PRN
  Administered 2015-08-03: 4 mg via INTRAVENOUS

## 2015-08-03 MED ORDER — ONDANSETRON HCL 4 MG/2ML IJ SOLN
INTRAMUSCULAR | Status: AC
  Filled 2015-08-03: qty 2

## 2015-08-03 MED ORDER — HYDROMORPHONE HCL 1 MG/ML IJ SOLN
0.2500 mg | INTRAMUSCULAR | Status: DC | PRN
  Administered 2015-08-03: 0.5 mg via INTRAVENOUS

## 2015-08-03 MED ORDER — ONDANSETRON HCL 4 MG/2ML IJ SOLN
INTRAMUSCULAR | Status: DC | PRN
  Administered 2015-08-03: 4 mg via INTRAVENOUS

## 2015-08-03 MED ORDER — 0.9 % SODIUM CHLORIDE (POUR BTL) OPTIME
TOPICAL | Status: DC | PRN
Start: 2015-08-03 — End: 2015-08-03
  Administered 2015-08-03: 2000 mL

## 2015-08-03 MED ORDER — PAPAVERINE HCL 30 MG/ML IJ SOLN
INTRAMUSCULAR | Status: AC
  Filled 2015-08-03: qty 2

## 2015-08-03 MED ORDER — ENOXAPARIN SODIUM 30 MG/0.3ML ~~LOC~~ SOLN
30.0000 mg | SUBCUTANEOUS | Status: DC
  Administered 2015-08-04 – 2015-08-06 (×3): 30 mg via SUBCUTANEOUS
  Filled 2015-08-03 (×3): qty 0.3

## 2015-08-03 MED ORDER — CEFPODOXIME PROXETIL 200 MG PO TABS: 100.0000 mg | ORAL_TABLET | Freq: Two times a day (BID) | ORAL | Status: DC

## 2015-08-03 MED ORDER — INSULIN ASPART 100 UNIT/ML ~~LOC~~ SOLN
8.0000 [IU] | Freq: Once | SUBCUTANEOUS | Status: AC
  Administered 2015-08-03: 8 [IU] via SUBCUTANEOUS
  Filled 2015-08-03 (×3): qty 0.08

## 2015-08-03 MED ORDER — HEPARIN SODIUM (PORCINE) 1000 UNIT/ML IJ SOLN
INTRAMUSCULAR | Status: AC
  Filled 2015-08-03: qty 1

## 2015-08-03 MED ORDER — PROPOFOL 10 MG/ML IV BOLUS
INTRAVENOUS | Status: DC | PRN
  Administered 2015-08-03: 100 mg via INTRAVENOUS
  Administered 2015-08-03: 20 mg via INTRAVENOUS

## 2015-08-03 MED ORDER — ATORVASTATIN CALCIUM 80 MG PO TABS
80.0000 mg | ORAL_TABLET | Freq: Every day | ORAL | Status: DC
  Administered 2015-08-03 – 2015-08-06 (×4): 80 mg via ORAL
  Filled 2015-08-03 (×4): qty 1

## 2015-08-03 MED ORDER — SODIUM CHLORIDE 0.9 % IV SOLN: 500.0000 mL | Freq: Once | INTRAVENOUS | Status: DC | PRN

## 2015-08-03 MED ORDER — TRAMADOL HCL 50 MG PO TABS
50.0000 mg | ORAL_TABLET | Freq: Four times a day (QID) | ORAL | Status: DC | PRN
  Administered 2015-08-04 – 2015-08-05 (×2): 50 mg via ORAL
  Filled 2015-08-03 (×2): qty 1

## 2015-08-03 MED ORDER — INSULIN ASPART 100 UNIT/ML ~~LOC~~ SOLN
0.0000 [IU] | Freq: Three times a day (TID) | SUBCUTANEOUS | Status: DC
  Administered 2015-08-04: 8 [IU] via SUBCUTANEOUS
  Administered 2015-08-04: 5 [IU] via SUBCUTANEOUS
  Administered 2015-08-04: 11 [IU] via SUBCUTANEOUS
  Administered 2015-08-05 (×2): 8 [IU] via SUBCUTANEOUS
  Administered 2015-08-05 – 2015-08-06 (×2): 3 [IU] via SUBCUTANEOUS
  Administered 2015-08-06: 5 [IU] via SUBCUTANEOUS
  Administered 2015-08-06: 3 [IU] via SUBCUTANEOUS
  Administered 2015-08-07: 5 [IU] via SUBCUTANEOUS

## 2015-08-03 MED ORDER — ALPRAZOLAM 0.5 MG PO TABS: 1.0000 mg | ORAL_TABLET | Freq: Four times a day (QID) | ORAL | Status: DC | PRN

## 2015-08-03 MED ORDER — SUGAMMADEX SODIUM 200 MG/2ML IV SOLN
INTRAVENOUS | Status: AC
  Filled 2015-08-03: qty 2

## 2015-08-03 MED ORDER — PHENYLEPHRINE HCL 10 MG/ML IJ SOLN
INTRAMUSCULAR | Status: DC | PRN
  Administered 2015-08-03 (×5): 80 ug via INTRAVENOUS

## 2015-08-03 MED ORDER — CARVEDILOL 3.125 MG PO TABS
3.1250 mg | ORAL_TABLET | Freq: Two times a day (BID) | ORAL | Status: DC
  Administered 2015-08-03 – 2015-08-07 (×8): 3.125 mg via ORAL
  Filled 2015-08-03 (×8): qty 1

## 2015-08-03 MED ORDER — LABETALOL HCL 5 MG/ML IV SOLN: 10.0000 mg | INTRAVENOUS | Status: DC | PRN

## 2015-08-03 MED ORDER — LIDOCAINE HCL (CARDIAC) 20 MG/ML IV SOLN
INTRAVENOUS | Status: AC
  Filled 2015-08-03: qty 5

## 2015-08-03 MED ORDER — CEFUROXIME SODIUM 750 MG IJ SOLR
INTRAMUSCULAR | Status: AC
  Filled 2015-08-03: qty 1500

## 2015-08-03 MED ORDER — LIDOCAINE HCL (CARDIAC) 20 MG/ML IV SOLN
INTRAVENOUS | Status: DC | PRN
  Administered 2015-08-03: 60 mg via INTRAVENOUS

## 2015-08-03 MED ORDER — HEMOSTATIC AGENTS (NO CHARGE) OPTIME
TOPICAL | Status: DC | PRN
  Administered 2015-08-03: 1 via TOPICAL

## 2015-08-03 MED ORDER — PHENYLEPHRINE HCL 10 MG/ML IJ SOLN
10.0000 mg | INTRAVENOUS | Status: DC | PRN
  Administered 2015-08-03: 20 ug/min via INTRAVENOUS

## 2015-08-03 MED ORDER — HYDRALAZINE HCL 20 MG/ML IJ SOLN: 5.0000 mg | INTRAMUSCULAR | Status: DC | PRN

## 2015-08-03 MED ORDER — METRONIDAZOLE 500 MG PO TABS: 500.0000 mg | ORAL_TABLET | Freq: Three times a day (TID) | ORAL | Status: DC

## 2015-08-03 MED ORDER — DOCUSATE SODIUM 100 MG PO CAPS
100.0000 mg | ORAL_CAPSULE | Freq: Every day | ORAL | Status: DC
  Administered 2015-08-04 – 2015-08-07 (×4): 100 mg via ORAL
  Filled 2015-08-03 (×4): qty 1

## 2015-08-03 MED ORDER — ROCURONIUM BROMIDE 100 MG/10ML IV SOLN
INTRAVENOUS | Status: DC | PRN
  Administered 2015-08-03: 50 mg via INTRAVENOUS

## 2015-08-03 MED ORDER — MIDAZOLAM HCL 5 MG/5ML IJ SOLN
INTRAMUSCULAR | Status: DC | PRN
  Administered 2015-08-03 (×2): 1 mg via INTRAVENOUS

## 2015-08-03 MED ORDER — ASPIRIN EC 81 MG PO TBEC
81.0000 mg | DELAYED_RELEASE_TABLET | Freq: Every day | ORAL | Status: DC
  Administered 2015-08-04 – 2015-08-07 (×4): 81 mg via ORAL
  Filled 2015-08-03 (×4): qty 1

## 2015-08-03 MED ORDER — ALBUTEROL SULFATE (2.5 MG/3ML) 0.083% IN NEBU: 2.5000 mg | INHALATION_SOLUTION | Freq: Four times a day (QID) | RESPIRATORY_TRACT | Status: DC | PRN

## 2015-08-03 MED ORDER — METOPROLOL TARTRATE 1 MG/ML IV SOLN: 2.0000 mg | INTRAVENOUS | Status: DC | PRN

## 2015-08-03 MED ORDER — HEPARIN SODIUM (PORCINE) 1000 UNIT/ML IJ SOLN
INTRAMUSCULAR | Status: DC | PRN
  Administered 2015-08-03 (×2): 2000 [IU] via INTRAVENOUS
  Administered 2015-08-03: 8000 [IU] via INTRAVENOUS
  Administered 2015-08-03: 2000 [IU] via INTRAVENOUS

## 2015-08-03 MED ORDER — POTASSIUM CHLORIDE CRYS ER 20 MEQ PO TBCR: 20.0000 meq | EXTENDED_RELEASE_TABLET | Freq: Every day | ORAL | Status: DC | PRN

## 2015-08-03 MED ORDER — PHENOL 1.4 % MT LIQD: 1.0000 | OROMUCOSAL | Status: DC | PRN

## 2015-08-03 MED ORDER — ACETAMINOPHEN 325 MG PO TABS
325.0000 mg | ORAL_TABLET | ORAL | Status: DC | PRN
  Administered 2015-08-05 – 2015-08-06 (×2): 650 mg via ORAL
  Filled 2015-08-03 (×2): qty 2

## 2015-08-03 MED ORDER — ACETAMINOPHEN 650 MG RE SUPP: 325.0000 mg | RECTAL | Status: DC | PRN

## 2015-08-03 MED ORDER — MORPHINE SULFATE (PF) 2 MG/ML IV SOLN: 2.0000 mg | INTRAVENOUS | Status: DC | PRN

## 2015-08-03 MED ORDER — HYDROMORPHONE HCL 1 MG/ML IJ SOLN
INTRAMUSCULAR | Status: AC
  Filled 2015-08-03: qty 1

## 2015-08-03 MED ORDER — POLYETHYLENE GLYCOL 3350 17 G PO PACK: 17.0000 g | PACK | Freq: Every day | ORAL | Status: DC | PRN

## 2015-08-03 MED ORDER — PANTOPRAZOLE SODIUM 40 MG PO TBEC
40.0000 mg | DELAYED_RELEASE_TABLET | Freq: Every day | ORAL | Status: DC
  Administered 2015-08-03 – 2015-08-07 (×5): 40 mg via ORAL
  Filled 2015-08-03 (×5): qty 1

## 2015-08-03 MED ORDER — MAGNESIUM HYDROXIDE 400 MG/5ML PO SUSP
30.0000 mL | Freq: Every day | ORAL | Status: DC | PRN
  Filled 2015-08-03: qty 30

## 2015-08-03 MED ORDER — SODIUM CHLORIDE 0.9 % IV SOLN
INTRAVENOUS | Status: DC
  Administered 2015-08-03: 21:00:00 via INTRAVENOUS

## 2015-08-03 MED ORDER — GUAIFENESIN-DM 100-10 MG/5ML PO SYRP: 15.0000 mL | ORAL_SOLUTION | ORAL | Status: DC | PRN

## 2015-08-03 MED ORDER — INSULIN ASPART 100 UNIT/ML ~~LOC~~ SOLN
8.0000 [IU] | Freq: Once | SUBCUTANEOUS | Status: AC
  Administered 2015-08-03: 6 [IU] via SUBCUTANEOUS
  Filled 2015-08-03: qty 0.08

## 2015-08-03 MED ORDER — IOPAMIDOL (ISOVUE-300) INJECTION 61%
INTRAVENOUS | Status: AC
  Filled 2015-08-03: qty 50

## 2015-08-03 MED ORDER — PROPOFOL 10 MG/ML IV BOLUS
INTRAVENOUS | Status: AC
Start: 2015-08-03 — End: 2015-08-03
  Filled 2015-08-03: qty 20

## 2015-08-03 MED ORDER — MUPIROCIN 2 % EX OINT
TOPICAL_OINTMENT | CUTANEOUS | Status: AC
  Administered 2015-08-03: 09:00:00
  Filled 2015-08-03: qty 22

## 2015-08-03 MED ORDER — DEXTROSE 5 % IV SOLN
1.5000 g | Freq: Two times a day (BID) | INTRAVENOUS | Status: AC
  Administered 2015-08-04 (×2): 1.5 g via INTRAVENOUS
  Filled 2015-08-03 (×3): qty 1.5

## 2015-08-03 MED ORDER — SODIUM CHLORIDE 0.9 % IV SOLN
INTRAVENOUS | Status: DC | PRN
  Administered 2015-08-03: 500 mL

## 2015-08-03 SURGICAL SUPPLY — 71 items
AGENT HMST SPONGE THK3/8 (HEMOSTASIS) ×3
BANDAGE ELASTIC 4 VELCRO ST LF (GAUZE/BANDAGES/DRESSINGS) IMPLANT
BANDAGE ESMARK 6X9 LF (GAUZE/BANDAGES/DRESSINGS) IMPLANT
BNDG CMPR 9X6 STRL LF SNTH (GAUZE/BANDAGES/DRESSINGS)
BNDG ESMARK 6X9 LF (GAUZE/BANDAGES/DRESSINGS)
BNDG GAUZE ELAST 4 BULKY (GAUZE/BANDAGES/DRESSINGS) ×2 IMPLANT
CANISTER SUCTION 2500CC (MISCELLANEOUS) ×5 IMPLANT
CLIP TI MEDIUM 24 (CLIP) ×5 IMPLANT
CLIP TI WIDE RED SMALL 24 (CLIP) ×5 IMPLANT
CONT SPEC 4OZ CLIKSEAL STRL BL (MISCELLANEOUS) ×2 IMPLANT
COVER PROBE W GEL 5X96 (DRAPES) ×5 IMPLANT
DRAIN CHANNEL 15F RND FF W/TCR (WOUND CARE) IMPLANT
DRAPE C-ARM 42X72 X-RAY (DRAPES) ×5 IMPLANT
ELECT REM PT RETURN 9FT ADLT (ELECTROSURGICAL) ×5
ELECTRODE REM PT RTRN 9FT ADLT (ELECTROSURGICAL) ×3 IMPLANT
EVACUATOR SILICONE 100CC (DRAIN) IMPLANT
GAUZE SPONGE 4X4 16PLY XRAY LF (GAUZE/BANDAGES/DRESSINGS) ×2 IMPLANT
GLOVE BIO SURGEON STRL SZ7 (GLOVE) ×7 IMPLANT
GLOVE BIOGEL PI IND STRL 6.5 (GLOVE) IMPLANT
GLOVE BIOGEL PI IND STRL 7.0 (GLOVE) IMPLANT
GLOVE BIOGEL PI IND STRL 7.5 (GLOVE) ×3 IMPLANT
GLOVE BIOGEL PI IND STRL 8 (GLOVE) IMPLANT
GLOVE BIOGEL PI INDICATOR 6.5 (GLOVE) ×8
GLOVE BIOGEL PI INDICATOR 7.0 (GLOVE) ×2
GLOVE BIOGEL PI INDICATOR 7.5 (GLOVE) ×2
GLOVE BIOGEL PI INDICATOR 8 (GLOVE) ×4
GLOVE ECLIPSE 6.0 STRL STRAW (GLOVE) ×2 IMPLANT
GLOVE ECLIPSE 6.5 STRL STRAW (GLOVE) ×2 IMPLANT
GLOVE ECLIPSE 7.5 STRL STRAW (GLOVE) ×2 IMPLANT
GLOVE SURG SS PI 6.5 STRL IVOR (GLOVE) ×2 IMPLANT
GLOVE SURG SS PI 7.0 STRL IVOR (GLOVE) ×2 IMPLANT
GOWN SPEC L4 XLG W/TWL (GOWN DISPOSABLE) ×2 IMPLANT
GOWN STRL REUS W/ TWL LRG LVL3 (GOWN DISPOSABLE) ×9 IMPLANT
GOWN STRL REUS W/ TWL XL LVL3 (GOWN DISPOSABLE) IMPLANT
GOWN STRL REUS W/TWL LRG LVL3 (GOWN DISPOSABLE) ×25
GOWN STRL REUS W/TWL XL LVL3 (GOWN DISPOSABLE) ×5
GRAFT PROPATEN THIN WALL 6X80 (Vascular Products) ×2 IMPLANT
HEMOSTAT SPONGE AVITENE ULTRA (HEMOSTASIS) ×2 IMPLANT
INSERT FOGARTY SM (MISCELLANEOUS) ×2 IMPLANT
KIT BASIN OR (CUSTOM PROCEDURE TRAY) ×5 IMPLANT
KIT ROOM TURNOVER OR (KITS) ×5 IMPLANT
LIQUID BAND (GAUZE/BANDAGES/DRESSINGS) ×4 IMPLANT
NS IRRIG 1000ML POUR BTL (IV SOLUTION) ×12 IMPLANT
PACK PERIPHERAL VASCULAR (CUSTOM PROCEDURE TRAY) ×5 IMPLANT
PAD ARMBOARD 7.5X6 YLW CONV (MISCELLANEOUS) ×10 IMPLANT
PATCH VASC XENOSURE 1CMX6CM (Vascular Products) ×5 IMPLANT
PATCH VASC XENOSURE 1X6 (Vascular Products) IMPLANT
SET MICROPUNCTURE 5F STIFF (MISCELLANEOUS) IMPLANT
SPONGE GAUZE 4X4 12PLY STER LF (GAUZE/BANDAGES/DRESSINGS) ×2 IMPLANT
SPONGE LAP 4X18 X RAY DECT (DISPOSABLE) ×2 IMPLANT
STAPLER VISISTAT 35W (STAPLE) IMPLANT
STOPCOCK 4 WAY LG BORE MALE ST (IV SETS) IMPLANT
SUT ETHILON 3 0 PS 1 (SUTURE) ×2 IMPLANT
SUT GORETEX 5 0 TT13 24 (SUTURE) ×2 IMPLANT
SUT GORETEX 6.0 TT13 (SUTURE) ×4 IMPLANT
SUT MNCRL AB 4-0 PS2 18 (SUTURE) ×13 IMPLANT
SUT PROLENE 5 0 C 1 24 (SUTURE) ×5 IMPLANT
SUT PROLENE 6 0 BV (SUTURE) ×9 IMPLANT
SUT PROLENE 7 0 BV 1 (SUTURE) IMPLANT
SUT PROLENE 7 0 BV1 MDA (SUTURE) ×2 IMPLANT
SUT SILK 2 0 FS (SUTURE) ×2 IMPLANT
SUT SILK 3 0 (SUTURE)
SUT SILK 3-0 18XBRD TIE 12 (SUTURE) IMPLANT
SUT VIC AB 2-0 CT1 27 (SUTURE) ×5
SUT VIC AB 2-0 CT1 TAPERPNT 27 (SUTURE) ×6 IMPLANT
SUT VIC AB 3-0 SH 27 (SUTURE) ×15
SUT VIC AB 3-0 SH 27X BRD (SUTURE) ×9 IMPLANT
TRAY FOLEY W/METER SILVER 16FR (SET/KITS/TRAYS/PACK) ×5 IMPLANT
TUBING EXTENTION W/L.L. (IV SETS) IMPLANT
UNDERPAD 30X30 INCONTINENT (UNDERPADS AND DIAPERS) ×5 IMPLANT
WATER STERILE IRR 1000ML POUR (IV SOLUTION) ×5 IMPLANT

## 2015-08-03 NOTE — Anesthesia Procedure Notes (Signed)
Procedure Name: Intubation Date/Time: 08/03/2015 12:16 PM Performed by: Fabian November Pre-anesthesia Checklist: Patient identified, Patient being monitored, Timeout performed, Emergency Drugs available and Suction available Patient Re-evaluated:Patient Re-evaluated prior to inductionOxygen Delivery Method: Circle System Utilized Preoxygenation: Pre-oxygenation with 100% oxygen Intubation Type: IV induction Ventilation: Mask ventilation without difficulty Laryngoscope Size: Mac and 3 Grade View: Grade III Tube type: Oral Tube size: 7.0 mm Number of attempts: 1 Airway Equipment and Method: Stylet Placement Confirmation: ETT inserted through vocal cords under direct vision,  positive ETCO2 and breath sounds checked- equal and bilateral Secured at: 22 cm Tube secured with: Tape Dental Injury: Teeth and Oropharynx as per pre-operative assessment  Difficulty Due To: Difficulty was unanticipated and Difficult Airway- due to anterior larynx Comments: Firt DL with miller 3 grade 4 view with esoph intubation. immediatly recognized and removed. Second DL with MAC 3 grade 3 view with AOI.

## 2015-08-03 NOTE — Anesthesia Preprocedure Evaluation (Signed)
Anesthesia Evaluation  Patient identified by MRN, date of birth, ID band Patient awake    Reviewed: Allergy & Precautions, H&P , NPO status , Patient's Chart, lab work & pertinent test results, reviewed documented beta blocker date and time   Airway Mallampati: III  TM Distance: >3 FB Neck ROM: Full    Dental no notable dental hx. (+) Teeth Intact, Dental Advisory Given   Pulmonary neg pulmonary ROS, former smoker,    Pulmonary exam normal breath sounds clear to auscultation       Cardiovascular + CAD, + Cardiac Stents, + CABG, + Peripheral Vascular Disease and +CHF   Rhythm:Regular Rate:Normal     Neuro/Psych Anxiety negative neurological ROS  negative psych ROS   GI/Hepatic Neg liver ROS, GERD  Medicated and Controlled,  Endo/Other  diabetes, Type 1, Insulin Dependent  Renal/GU Renal disease  negative genitourinary   Musculoskeletal   Abdominal   Peds  Hematology negative hematology ROS (+) anemia ,   Anesthesia Other Findings   Reproductive/Obstetrics negative OB ROS                             Anesthesia Physical Anesthesia Plan  ASA: III  Anesthesia Plan: General   Post-op Pain Management:    Induction: Intravenous  Airway Management Planned: Oral ETT  Additional Equipment:   Intra-op Plan:   Post-operative Plan: Extubation in OR  Informed Consent: I have reviewed the patients History and Physical, chart, labs and discussed the procedure including the risks, benefits and alternatives for the proposed anesthesia with the patient or authorized representative who has indicated his/her understanding and acceptance.   Dental advisory given  Plan Discussed with: CRNA  Anesthesia Plan Comments:         Anesthesia Quick Evaluation

## 2015-08-03 NOTE — Op Note (Signed)
OPERATIVE NOTE   PROCEDURE: 1.  Left iliofemoral endarterectomy with bovine patch angioplasty 2.  Extended left profundoplasty  3.  Left common femoral artery to below-the-knee popliteal artery bypass with Propaten 4.  Left fifth ray amputation  PRE-OPERATIVE DIAGNOSIS: left 5th toe gangrene  POST-OPERATIVE DIAGNOSIS: same as above   SURGEON: Leonides Sake, MD  ASSISTANT(S): Doreatha Massed, PAC   ANESTHESIA: general  ESTIMATED BLOOD LOSS: 100 cc  FINDING(S): 1.  High femoral bifurcation 2.  Minimal backbleeding from superficial femoral artery 3.  Severe calcific disease in common femoral artery extending into proximal profunda femoral artery and distal external iliac artery  4.  Moderately calcified left below-the-knee popliteal artery 5.  Biphasic posterior tibial artery signal at end of case 6.  Monophasic anterior tibial artery signal at end of case  SPECIMEN(S):  Left 5th toe  INDICATIONS:   Isabel [name] is a 50 y.o. female who presents with dry gangrene in her left 5th toe.  Dr. Hart Rochester recommended: left common femoral artery to below-the-knee popliteal artery bypass with amputation of the left 5th toe.   I agreed to perform the operation given Dr. Candie Chroman lack of availability.  The risk, benefits, and alternative for bypass operations were discussed with the patient.  The patient is aware the risks include but are not limited to: bleeding, infection, myocardial infarction, stroke, limb loss, nerve damage, need for additional procedures in the future, wound complications, and inability to complete the bypass. The patient is aware of these risks and agreed to proceed.   DESCRIPTION: After full informed written consent was obtained, the patient was brought back to the operating room and placed supine upon the operating table.  Prior to induction, the patient was given intravenous antibiotics.  After obtaining adequate anesthesia, the patient was prepped and draped in the  standard fashion for a femoral to popliteal bypass operation.  Attention was turned to the right groin.  A longitudinal incision was made over what I thought was the right common femoral artery.  Using blunt dissection and electrocautery, a 5 cm segment of common femoral artery was dissected out.  I placed vessel loops to obtain control of this common femoral artery.  This common femoral artery was found on exam to be calcified.    At this point, attention was turned to the calf.  An longitudinal incision was made one finger-width posterior to the tibia.  Using blunt dissection and electrocautery, a plane was developed through the subcutaneous tissue and fascia down to the popliteal space.  In this process, a segment of the greater saphenous vein was encountered.  This was <2 mm in diameter and non-suitable for use as a bypass conduit.  The popliteal vein was dissected out and retracted medially and posteriorly.  The below-the-knee popliteal artery was dissected away from lateral popliteal vein.  This popliteal artery was found on exam to be partially calcified.    At this point, I bluntly dissected a space between the femoral condyles adjacent to the below-the-knee popliteal vessels.  I bluntly passed the long Gore metal tunneler between the femoral condyles in a subsartorial fashion to the groin incision.  The bullet on the tunneler was removed 6 mm Propaten was sewn to the inner cannula with a 2-0 Silk.  I passed the conduit through the metal tunnel, taking care to maintain the orientation of the conduit.    At this point, the patient was given 8000 units of Heparin intravenously, which was a therapeutic bolus.  In total, 16109 units of Heparin was administrated to achieve and maintain a therapeutic level of anticoagulation, giving another 2000 units every subsequent hourly.  After waiting three minutes, I clamped the proximal and distal common femoral artery.  I made an incision with a 11-blade but I could  not easily find a lumen.  I had to extend the arteriotomy proximally with a Potts scissor, encountering some limited bleeding.  I tried to dissect distally to the femoral bifurcation, but I could not find the profunda femoral artery.  I then dissected more proximally until I found a high femoral bifurcation slightly distal to the inguinal ligament.  I tied off the proximal and distal superficial femoral artery distally with 2-0 silk and transected the superficial femoral artery to aid further dissection of the femoral bifurcation.  I fully dissected out the profunda femoral arteries, a total of 3 branches found.  I dissected out the distal external iliac artery until I found a soft area to clamp the left external iliac artery.  I placed the profunda femoral arteries under tension and clamped the distal external iliac artery.  I elected to fully transect the superficial femoral artery and then extend the arteriotomy into the distal external iliac artery.  I completed an endarterectomy in the mid-segment of the common femoral artery and carried the calcified plaque into the distal external iliac artery.  I then continued the dissection distally into the proximal profunda femoral artery.  There was substantial calcific plaque extending into the main profunda femoral artery.  I had to extend the arteriotomy ~4 cm into the profunda femoral artery.  I carried the endarterectomy distal until the plaque essentially feathered out in the profunda femoral artery.  I continued to extract loose intima until there was no further loose intima.  I sewed a previously soaked bovine pericardial patch from the distal external iliac artery down onto the profunda femoral artery with two running stitches of 6-0 Prolene.  Prior to completing this patch, I backbled the profunda femoral artery which had good backbleeding.  The bleeding from the distal external iliac artery was pulsatile and vigorous.    At this point, the proximal conduit  was spatulated to facilitate an end-to-side anastomosis.  I made an incision in the bovine patch overlying the common femoral artery.  I extended it to match the size of the proximal conduit.  The conduit was sewn to the bovine patched common femoral artery with a running stitch of CV-5.  Prior to completing this anastomosis, all vessels were backbled.  No thrombus was noted from any vessels and backbleeding was: excellent.  .  The anastomosis was completed in the usual fashion.  Attention was then turned to the popliteal exposure.   I reset the exposure of the popliteal space.  I verified the popliteal artery was appropriately marked.  I determine the target segment for the anastomosis.  I applied tension to the artery proximally and distally with vessel loops.  I made an incision with a 11-blade in the artery and extended it proximally and distally with a Potts scissor.  I pulled the conduit to appropriate tension and length, taking into account straightening out the leg.  I adjusted the length of the conduit sharply.  I spatulated this conduit to meet the dimensions of the arteriotomy.  The conduit was sewn to the common femoral artery with a running stitch of CV-6.  Prior to completing this anastomosis, all vessels were backbled.  No thrombus was noted from any  vessels and backbleeding was: acceptable.  The bypass conduit was allowed to bleed in an antegrade fashion.  The bleeding was: vigorous.  The anastomosis was completed in the usual fashion.  At this point, all incisions were washed out and Avitene was placed into both incisions.  The continuous doppler exam demonstrated distally: biphasic posterior tibial artery and monophasic anterior tibial artery.    At this point, I gave the patient 70 mg of Protamine to reverse anticoagulation.  At this point, bleeding in both incisions were controlled with electrocautery and suture ligature.  After another round of Avitene, no further active bleeding was  present.  I washed out both incisions.  The calf was repaired with interrupted deep sutures to reapproximate the deep muscles and a running stitch of 3-0 Vicryl in the subcutaneous tissue.  The skin was reapproximated with a running subcuticular of 4-0 Monocryl.  After cleaning and drying the skin, the skin was reinforced with Dermabond.  Attention was turned to the groin.  The groin was repaired with a double layer of 2-0 Vicryl immediately superficial to the bypass conduit.  The superficial subcutaneous tissue was reapproximated with a double layer of 3-0 Vicryl.  The skin was reapproximated with a running subcuticular of 4-0 Monocryl.  The skin was cleaned, dried, and reinforced with Dermabond.  At this point, I turned my attention to the left foot.  I made a racquet incision around the left fifth toe.  I dissected out the left fifth toe with electrocautery.  In this process, the proximal phalange fractured, suggesting some osteomyelitis in the bone.  I debrided the residual proximal phalange with a rongeur.  There was some minimally necrotic tissue at the base of the left fifth toe.  There was also minimal amount of bleeding surrounding the left fifth toe amputation.  I elected to leave this wound open given the necrotic tissue found.  I packed this wound with a wet-to-dry dressing and a sterile dressing was applied.  This dressing was affixed with a Kerlix dressing.      COMPLICATIONS: none  CONDITION: stable   Leonides Sake, MD [facility] [place] Office: [phone] Pager: [phone]  08/03/2015, 5:25 PM

## 2015-08-03 NOTE — Transfer of Care (Signed)
Immediate Anesthesia Transfer of Care Note  Patient: Isabel [name]  Procedure(s) Performed: Procedure(s): BYPASS GRAFT  LEFT FEMORAL TO  BELOW KNEE POPLITEAL ARTERY USING X 80CM GORE PROPATEN GRAFT (Left) ENDARTERECTOMY LEFT ILIO-FEMORAL PROFUNDA (Left) PATCH ANGIOPLASTY RIGHT ILIO-FEMORAL PROFUNDA USING XENOSURE BIOLOGIC PATCH (Left) AMPUTATION LEFT FIFTH TOE (Left)  Patient Location: PACU  Anesthesia Type:General  Level of Consciousness: awake  Airway & Oxygen Therapy: Patient Spontanous Breathing and Patient connected to nasal cannula oxygen  Post-op Assessment: Report given to RN and Post -op Vital signs reviewed and stable  Post vital signs: Reviewed and stable  Last Vitals:  Filed Vitals:   08/03/15 0747  BP: 159/67  Pulse: 88  Temp: 37 C  Resp: 18    Complications: No apparent anesthesia complications

## 2015-08-03 NOTE — H&P (View-Only) (Signed)
Vascular Surgery Consultation  Reason for Consult: Gangrene left fifth toe  HPI: Isabel [name] is a 50 y.o. female who presents for evaluation of gangrene left fifth toe. Patient developed blister on left fifth toe about 6-8 weeks ago. This has gradually progressed to dry gangrene. She was seen in the wound center earlier today. She came to the emergency department or pain. She has not had a vascular evaluation. She does have a previous history of claudication in the left calf after walking about 1 block. She has no previous history of nonhealing ulcers infection or gangrene. She has no symptoms in the contralateral right leg. She did have coronary artery bypass grafting in 2015. She quit smoking about 20 years ago. She has type 1 diabetes mellitus. She has had no chills and fever.   Past Medical History  Diagnosis Date  . Coronary artery disease   . Diabetes mellitus without complication (HCC)   . Renal disorder     kidney stones  . CHF (congestive heart failure) [facility])    Past Surgical History  Procedure Laterality Date  . Cardiac stents    . Back surgery    . Kidney stone surgery    . Fallopian tube and 1 ovary removed    . Tonsillectomy    . Coronary artery bypass graft N/A 06/08/2013    Procedure: CORONARY ARTERY BYPASS GRAFTING (CABG) times four using left internal mammary and right saphenous vein.;  Surgeon: Delight Ovens, MD;  Location: [facility] OR;  Service: Open Heart Surgery;  Laterality: N/A;  . Intraoperative transesophageal echocardiogram N/A 06/08/2013    Procedure: INTRAOPERATIVE TRANSESOPHAGEAL ECHOCARDIOGRAM;  Surgeon: Delight Ovens, MD;  Location: [facility] OR;  Service: Open Heart Surgery;  Laterality: N/A;  . Left and right heart catheterization with coronary angiogram N/A 06/07/2013    Procedure: LEFT AND RIGHT HEART CATHETERIZATION WITH CORONARY ANGIOGRAM;  Surgeon: Marykay Lex, MD;  Location: [facility] CATH LAB;  Service: Cardiovascular;  Laterality: N/A;   Social History    Social History  . Marital Status: Married    Spouse Name: N/A  . Number of Children: N/A  . Years of Education: N/A   Social History Main Topics  . Smoking status: Former Smoker -- 0.50 packs/day for 10 years    Types: Cigarettes    Start date: 12/26/1992    Quit date: 04/15/2000  . Smokeless tobacco: Never Used     Comment: QUIT SMOKING YEARS AGO "  . Alcohol Use: 0.0 oz/week    0 Standard drinks or equivalent per week     Comment: occ  . Drug Use: No  . Sexual Activity: Not Asked   Other Topics Concern  . None   Social History Narrative   Family History  Problem Relation Age of Onset  . Heart disease Mother   . Diabetes Mother   . Stroke Mother   . Cancer Father   . Heart disease Father   . Diabetes Father   . Heart disease Brother   . Diabetes Paternal Aunt   . Stroke Maternal Grandmother   . Diabetes Paternal Grandfather    No Known Allergies Prior to Admission medications   Medication Sig Start Date End Date Taking? Authorizing Provider  ALPRAZolam Prudy Feeler) 1 MG tablet Take 1 mg by mouth 4 (four) times daily as needed for anxiety.  05/06/13  Yes Historical Provider, MD  aspirin EC 81 MG tablet Take 81 mg by mouth daily.   Yes Historical Provider, MD  atorvastatin (  LIPITOR) 80 MG tablet Take 1 tablet (80 mg total) by mouth daily at 6 PM. 06/18/13  Yes Gina L Collins, PA-C  carvedilol (COREG) 3.125 MG tablet Take 1 tablet (3.125 mg total) by mouth 2 (two) times daily. 11/30/13  Yes Laqueta Linden, MD  HUMALOG KWIKPEN 100 UNIT/ML KiwkPen Inject 10 Units into the skin daily as needed. Per sliding scale. For high sugar 10/22/13  Yes Historical Provider, MD  HYDROcodone-acetaminophen (NORCO) 10-325 MG tablet Take 1 tablet by mouth every 6 (six) hours as needed for moderate pain.   Yes Historical Provider, MD  Insulin Detemir (LEVEMIR) 100 UNIT/ML Pen Inject 100 Units into the skin See admin instructions. Take 40 units in the morning, then take 20 units in the  afternoon, then take 20 units again to equal out to 100units a day per patient 06/18/13  Yes Wilmon Pali, PA-C  lisinopril (PRINIVIL,ZESTRIL) 5 MG tablet Take 1 tablet (5 mg total) by mouth daily. 06/18/13  Yes Wilmon Pali, PA-C  omeprazole (PRILOSEC) 20 MG capsule Take 1 capsule by mouth 2 (two) times daily. 11/17/13  Yes Historical Provider, MD  polyethylene glycol (MIRALAX / GLYCOLAX) packet Take 17 g by mouth daily as needed for mild constipation.  06/18/13  Yes Gina L Collins, PA-C  PROAIR HFA 108 (90 BASE) MCG/ACT inhaler Inhale 2 puffs into the lungs 4 (four) times daily as needed for shortness of breath.  01/09/15  Yes Historical Provider, MD  traMADol (ULTRAM) 50 MG tablet Take 1-2 tablets (50-100 mg total) by mouth every 4 (four) hours as needed for moderate pain. 06/18/13  Yes Wilmon Pali, PA-C  Insulin Pen Needle 29G X MISC 1 box 06/18/13   Wilmon Pali, PA-C     Positive ROS: Denies chest pain, dyspnea on exertion, PND, orthopnea, hemoptysis, lateralizing weakness, aphasia, amaurosis fugax, diplopia, blurred vision, syncope. Also has history of kidney stones. Has had coronary artery bypass grafting. Denies arrhythmias. Is not on anticoagulants.  All other systems have been reviewed and were otherwise negative with the exception of those mentioned in the HPI and as above.  Physical Exam: Filed Vitals:   07/26/15 2015 07/26/15 2100  BP: 152/80 169/79  Pulse: 79 81  Temp:    Resp:      General: Alert, no acute distress HEENT: Normal for age Cardiovascular: Regular rate and rhythm. Carotid pulses 2+, no bruits audible Respiratory: Clear to auscultation. No cyanosis, no use of accessory musculature GI: No organomegaly, abdomen is soft and non-tender Skin: No lesions in the area of chief complaint Neurologic: Sensation intact distally Psychiatric: Patient is competent for consent with normal mood and affect Musculoskeletal: No obvious deformities Extremities: Right leg with  3+ femoral 2+ popliteal and 2+ dorsalis pedis pulse palpable. Left leg with absent femoral and distal pulses. Dry gangrene distal half of left fifth toe. Mild swelling dorsum of foot and laterally. No fluctuance or purulence drainage.   Imaging reviewed: ABIs apparently done a few weeks ago which revealed 0.42 on the left and 0.87 on the right.   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 Edilia Bo if schedule permits although it could possibly be done on Friday if necessary. Patient may well need inflow and outflow procedure such as possible iliac stent or right to left femoral-femoral bypass and left femoral-popliteal bypass graft. This will be dependent on findings of angiography  Discuss this plan with patient and her husband and they are agreeable Needs to be nothing by mouth tomorrow morning-Thursday after 7 AM   Josephina Gip, MD 07/26/2015 9:22 PM

## 2015-08-03 NOTE — Interval H&P Note (Signed)
[facility] [place]  History and Physical Update  The patient was interviewed and re-examined.  The patient's previous History and Physical has been reviewed and is unchanged from Dr. Candie Chroman consult except for: interval angiogram.  I recommended to the patient: L fem-BK pop bypass with Propaten as her vein is inadequate.    The risk, benefits, and alternative for bypass operations were discussed with the patient.    The patient is aware the risks include but are not limited to: bleeding, infection, myocardial infarction, stroke, limb loss, nerve damage, need for additional procedures in the future, wound complications, and inability to complete the bypass.   The patient is aware of the limited patency of fem-pop bypass with prosthetic graft  The patient is aware of these risks and agreed to proceed.   Leonides Sake, MD [facility] [place] Office: [phone] Pager: [phone]  08/03/2015, 11:39 AM

## 2015-08-04 ENCOUNTER — Encounter (HOSPITAL_COMMUNITY): Payer: BLUE CROSS/BLUE SHIELD

## 2015-08-04 ENCOUNTER — Encounter (HOSPITAL_COMMUNITY): Payer: Self-pay | Admitting: Vascular Surgery

## 2015-08-04 LAB — GLUCOSE, CAPILLARY
GLUCOSE-CAPILLARY: 232 mg/dL — AB (ref 65–99)
GLUCOSE-CAPILLARY: 212 mg/dL — AB (ref 65–99)
GLUCOSE-CAPILLARY: 248 mg/dL — AB (ref 65–99)
GLUCOSE-CAPILLARY: 285 mg/dL — AB (ref 65–99)
Glucose-Capillary: 200 mg/dL — ABNORMAL HIGH (ref 65–99)
Glucose-Capillary: 276 mg/dL — ABNORMAL HIGH (ref 65–99)
Glucose-Capillary: 341 mg/dL — ABNORMAL HIGH (ref 65–99)

## 2015-08-04 LAB — CBC
HEMATOCRIT: 28 % — AB (ref 36.0–46.0)
HEMOGLOBIN: 8.9 g/dL — AB (ref 12.0–15.0)
MCH: 29.8 pg (ref 26.0–34.0)
MCHC: 31.8 g/dL (ref 30.0–36.0)
MCV: 93.6 fL (ref 78.0–100.0)
Platelets: 222 10*3/uL (ref 150–400)
RBC: 2.99 MIL/uL — ABNORMAL LOW (ref 3.87–5.11)
RDW: 13.5 % (ref 11.5–15.5)
WBC: 10.4 10*3/uL (ref 4.0–10.5)

## 2015-08-04 LAB — BASIC METABOLIC PANEL
ANION GAP: 9 (ref 5–15)
BUN: 19 mg/dL (ref 6–20)
CALCIUM: 8.4 mg/dL — AB (ref 8.9–10.3)
CHLORIDE: 102 mmol/L (ref 101–111)
CO2: 25 mmol/L (ref 22–32)
Creatinine, Ser: 0.75 mg/dL (ref 0.44–1.00)
GFR calc Af Amer: >60 mL/min (ref >60)
GFR calc non Af Amer: >60 mL/min (ref >60)
GLUCOSE: 223 mg/dL — AB (ref 65–99)
Potassium: 4.6 mmol/L (ref 3.5–5.1)
Sodium: 136 mmol/L (ref 135–145)

## 2015-08-04 MED ORDER — OXYCODONE-ACETAMINOPHEN 5-325 MG PO TABS
1.0000 | ORAL_TABLET | ORAL | Status: DC | PRN
  Administered 2015-08-06 – 2015-08-07 (×2): 2 via ORAL
  Filled 2015-08-04 (×2): qty 2

## 2015-08-04 MED ORDER — INSULIN DETEMIR 100 UNIT/ML ~~LOC~~ SOLN
22.0000 [IU] | Freq: Every day | SUBCUTANEOUS | Status: DC
  Administered 2015-08-04 – 2015-08-07 (×4): 22 [IU] via SUBCUTANEOUS
  Filled 2015-08-04 (×4): qty 0.22

## 2015-08-04 NOTE — Progress Notes (Addendum)
  Progress Note    08/04/2015 7:26 AM 1 Day Post-Op  Subjective:  C/o pain in left leg  Tm 99.3  HR  70's-90's NSR 110's-130's systolic 96% 2LO2NC  Filed Vitals:   08/04/15 0006 08/04/15 0438  BP: 130/62 116/59  Pulse: 94 96  Temp: 99 F (37.2 C) 99.3 F (37.4 C)  Resp: 14 15    Physical Exam: Cardiac:  regular Lungs:  Non labored Incisions:  Left groin clean and dry; below knee incision is clean and dry Extremities:  +doppler signals left AT/PT/peroneal; left 5th toe amp site is pale and dusky   CBC    Component Value Date/Time   WBC 10.4 08/04/2015 0430   RBC 2.99* 08/04/2015 0430   RBC 3.34* 07/27/2015 0601   HGB 8.9* 08/04/2015 0430   HCT 28.0* 08/04/2015 0430   HCT 30.7* 07/27/2015 0601   PLT 222 08/04/2015 0430   MCV 93.6 08/04/2015 0430   MCH 29.8 08/04/2015 0430   MCHC 31.8 08/04/2015 0430   RDW 13.5 08/04/2015 0430   LYMPHSABS 1.6 07/27/2015 0601   MONOABS 0.4 07/27/2015 0601   EOSABS 0.2 07/27/2015 0601   BASOSABS 0.0 07/27/2015 0601    BMET    Component Value Date/Time   NA 136 08/04/2015 0430   K 4.6 08/04/2015 0430   CL 102 08/04/2015 0430   CO2 25 08/04/2015 0430   GLUCOSE 223* 08/04/2015 0430   BUN 19 08/04/2015 0430   CREATININE 0.75 08/04/2015 0430   CALCIUM 8.4* 08/04/2015 0430   GFRNONAA >60 08/04/2015 0430   GFRAA >60 08/04/2015 0430    INR    Component Value Date/Time   INR 1.10 08/03/2015 0833     Intake/Output Summary (Last 24 hours) at 08/04/15 0726 Last data filed at 08/04/15 0500  Gross per 24 hour  Intake   3300 ml  Output    980 ml  Net   2320 ml     Assessment:  50 y.o. female is s/p:  1. Left iliofemoral endarterectomy with bovine patch angioplasty 2. Extended left profundoplasty  3. Left common femoral artery to below-the-knee popliteal artery bypass with Propaten 4. Left fifth ray amputation  1 Day Post-Op   Plan: -pt with patent bypass graft with +doppler signals left AT/PT/peroneal -toe  amp site pale/dusky-will consult Dr. Lajoyce Corners -acute surgical blood loss anemia-tolerating -DVT prophylaxis:  Lovenox to start this afternoon -PT eval-increase ambulation -wet to dry dressing changes bid -dry gauze to left groin-discussed with the pt importance of keeping groin dry to help prevent wound infection.  She expresses understanding -nausea/vomiting with vicodin-will change to Percocet to see if she gets better result -heel weight bearing only-will order shoe   Doreatha Massed, PA-C [facility] [phone] 08/04/2015 7:26 AM  Addendum  I have independently interviewed and examined the patient, and I agree with the physician assistant's findings.  Extensive calcific disease in L groin requiring extended EA w/ BPA discussed with patient.  Her L 5th toe amp site does not look viable with limited bleeding intraoperatively.  Will get Dr. Audrie Lia input.  Ok to transfer to floor.  Will need diabetic coordinator's assistance given A1c >9.0.  PT/OT/ABI pending.  Leonides Sake, MD [facility] [place] Office: [phone] Pager: [phone]  08/04/2015, 5:40 PM

## 2015-08-04 NOTE — Evaluation (Signed)
Occupational Therapy Evaluation Patient Details Name: Isabel [name] MRN: [id number] DOB: [birth year] Today's Date: 08/04/2015    History of Present Illness 50 yo female s/p L iliofemoral endarterectomy with bovine patch angioplasty, L common femoral artery to below the knee popliteal artery bypass with propaten L 5th ray amputation PMH: IDDM CAD s/p CABG x4 2015    Clinical Impression   Patient is s/p L 5th ray amputation surgery resulting in functional limitations due to the deficits listed below (see OT problem list). PTA from home independent level. Patient will benefit from skilled OT acutely to increase independence and safety with ADLS to allow discharge HHOT.     Follow Up Recommendations  Home health OT    Equipment Recommendations  3 in 1 bedside comode    Recommendations for Other Services       Precautions / Restrictions Precautions Precautions: Fall Restrictions Weight Bearing Restrictions: No LLE Partial Weight Bearing Percentage or Pounds: heel only Other Position/Activity Restrictions: darco shoe L LE      Mobility Bed Mobility Overal bed mobility: Needs Assistance Bed Mobility: Supine to Sit     Supine to sit: Min assist     General bed mobility comments: (A) to hold L LE with progressing to EOB  Transfers Overall transfer level: Needs assistance Equipment used: Rolling walker (2 wheeled) Transfers: Sit to/from [employer] Sit to Stand: Min assist Stand pivot transfers: Min assist       General transfer comment: pt transfering to the R    Balance                                            ADL Overall ADL's : Needs assistance/impaired Eating/Feeding: Independent   Grooming: Wash/dry hands;Independent               Lower Body Dressing: Maximal assistance;Sit to/from stand Lower Body Dressing Details (indicate cue type and reason): unable to don darco shoe Toilet Transfer: Minimal  assistance;Stand-pivot             General ADL Comments: Pt reports pain in L LE and not taking any medication since admission. pt very anxious about medications due to hx of vomitting with medications. pt encouraged to talk to RN about medications. pt with hx of taking tramadol`     Vision Vision Assessment?: No apparent visual deficits   Perception     Praxis      Pertinent Vitals/Pain Pain Assessment: Faces Faces Pain Scale: Hurts whole lot Pain Location: L foot Pain Descriptors / Indicators: Discomfort Pain Intervention(s): Monitored during session;Repositioned;Patient requesting pain meds-RN notified     Hand Dominance Right   Extremity/Trunk Assessment Upper Extremity Assessment Upper Extremity Assessment: Overall WFL for tasks assessed   Lower Extremity Assessment Lower Extremity Assessment: Defer to PT evaluation   Cervical / Trunk Assessment Cervical / Trunk Assessment: Normal   Communication Communication Communication: No difficulties   Cognition Arousal/Alertness: Awake/alert Behavior During Therapy: WFL for tasks assessed/performed Overall Cognitive Status: Within Functional Limits for tasks assessed                     General Comments       Exercises       Shoulder Instructions      Home Living Family/patient expects to be discharged to:: Private residence Living Arrangements: Spouse/significant other Available Help at Discharge: Family;Available  24 hours/day Type of Home: House Home Access: Ramped entrance     Home Layout: One level     Bathroom Shower/Tub: Walk-in sProducer, television/film/videoicapped height     Home Equipment: Shower seat;Grab bars - toilet;Walker - 2 wheels;Wheelchair - manual;Cane - single point;Bedside commode;Hand held shower head (lift chair)          Prior Functioning/Environment Level of Independence: Independent             OT Diagnosis: Generalized weakness;Acute pain   OT Problem  List: Decreased strength;Decreased activity tolerance;Impaired balance (sitting and/or standing);Decreased safety awareness;Decreased knowledge of use of DME or AE;Decreased knowledge of precautions;Pain   OT Treatment/Interventions: Self-care/ADL training;Therapeutic exercise;DME and/or AE instruction;Therapeutic activities;Patient/family education;Balance training    OT Goals(Current goals can be found in the care plan section) Acute Rehab OT Goals Patient Stated Goal: to return home OT Goal Formulation: With patient Time For Goal Achievement: 08/18/15 Potential to Achieve Goals: Good  OT Frequency: Min 2X/week   Barriers to D/C:            Co-evaluation              End of Session Equipment Utilized During Treatment: Gait belt;Rolling walker Nurse Communication: Mobility status;Precautions  Activity Tolerance: Patient tolerated treatment well Patient left: in chair;with call bell/phone within reach;with nursing/sitter in room   Time: 1347-1411 OT Time Calculation (min): 24 min Charges:  OT General Charges $OT Visit: 1 Procedure OT Evaluation $OT Eval Moderate Complexity: 1 Procedure G-Codes:    Harolyn Rutherford 08/27/15, 2:40 PM   Mateo Flow   OTR/L Pager: [phone] Office: [phone] .

## 2015-08-04 NOTE — Care Management Note (Signed)
Case Management Note  Patient Details  Name: Isabel [name] MRN: [id number] Date of Birth: 1966-03-22  Subjective/Objective:    Patient lives with her spouse, s/p fem poip and l toe amuptation, she has a boot on that foot, also per pt/ot eval rec hhpt/hhot, will also need HHRN for dressing changes.  Patient chose [facility], referral made to [facility] with [facility] for [facility], HHPt, HHOT.  Soc will begin 24-48 hrs post dc.  Patient will also need 3 n 1, NCM  will need to contact [facility] for DME. Before discharge.                 Action/Plan:   Expected Discharge Date:                  Expected Discharge Plan:  Home w Home Health Services  In-House Referral:     Discharge planning Services  CM Consult  Post Acute Care Choice:  Home Health Choice offered to:  Patient  DME Arranged:  3-N-1 DME Agency:  [facility]  HH Arranged:  RN, PT, OT [facility] Agency:  [facility]  Status of Service:  In process, will continue to follow  Medicare Important Message Given:    Date Medicare IM Given:    Medicare IM give by:    Date Additional Medicare IM Given:    Additional Medicare Important Message give by:     If discussed at Long Length of Stay Meetings, dates discussed:    Additional Comments:  Leone Haven, RN 08/04/2015, 5:01 PM

## 2015-08-04 NOTE — Progress Notes (Signed)
Orthopedic Tech Progress Note Patient Details:  PHUONG SELVAGGIO 1966-02-12 [id number]  Patient ID: [name], female   DOB: 09-24-1965, 50 y.o.   MRN: [id number] As ordered by PA Sherlie Ban, Raja Caputi 08/04/2015, 8:48 AM

## 2015-08-04 NOTE — Progress Notes (Addendum)
Inpatient Diabetes Program Recommendations  AACE/ADA: New Consensus Statement on Inpatient Glycemic Control (2015)  Target Ranges:  Prepandial:   less than 140 mg/dL      Peak postprandial:   less than 180 mg/dL (1-2 hours)      Critically ill patients:  140 - 180 mg/dL   Results for ALAYIA, RENA (MRN [id number]) as of 08/04/2015 08:16  Ref. Range 08/03/2015 07:50 08/03/2015 09:58 08/03/2015 17:36 08/03/2015 21:49  Glucose-Capillary Latest Ref Range: 65-99 mg/dL 488 (H) 891 (H) 694 (H) 147 (H)   Results for TREA, CHRISP (MRN [id number]) as of 08/04/2015 08:16  Ref. Range 08/04/2015 04:30  Glucose Latest Ref Range: 65-99 mg/dL 034 (H)    Admit with: Vascular surgery  History: DM  Home DM Meds: Levemir 40 units AM/ 20 units Afternoon/ 40 units PM       Humalog per SSI  Current Insulin Orders: Novolog Moderate Correction Scale/ SSI (0-15 units) TID AC     MD- Note fasting glucose elevated this AM.  Please consider the following in-hospital insulin adjustments:  Please start Levemir 22 units daily (0.3 units/kg dosing)- Note that patient takes 100 units total Levemir at home, however, would be cautious in stating that much Levemir at this point    Addendum 1038: Spoke with Doreatha Massed, PA with Vascular.  Telephone orders given to start Levemir 22 units daily (start this AM).  Orders placed and reviewed with RN caring for pt today.   --Will follow patient during hospitalization--  Ambrose Finland RN, MSN, CDE Diabetes Coordinator Inpatient Glycemic Control Team Team Pager: [phone] (8a-5p)

## 2015-08-04 NOTE — Evaluation (Signed)
Physical Therapy Evaluation Patient Details Name: Isabel [name] MRN: [id number] DOB: 1965/04/28 Today's Date: 08/04/2015   History of Present Illness  50 yo female s/p L iliofemoral endarterectomy with bovine patch angioplasty, L common femoral artery to below the knee popliteal artery bypass with propaten L 5th ray amputation PMH: IDDM CAD s/p CABG x4 2015   Clinical Impression  Patient demonstrates deficits in functional mobility as indicated below. Will need continued skilled PT to address deficits and maximize function. Will see as indicated and progress as tolerated.    Follow Up Recommendations Home health PT;Supervision - Intermittent    Equipment Recommendations  None recommended by PT (states that she has a bedside commode)    Recommendations for Other Services       Precautions / Restrictions Precautions Precautions: Fall Restrictions Weight Bearing Restrictions: No LLE Partial Weight Bearing Percentage or Pounds: heel only Other Position/Activity Restrictions: darco shoe L LE      Mobility  Bed Mobility Overal bed mobility: Needs Assistance Bed Mobility: Supine to Sit     Supine to sit: Min assist     General bed mobility comments: (A) to hold L LE with progressing to EOB  Transfers Overall transfer level: Needs assistance Equipment used: Rolling walker (2 wheeled) Transfers: Sit to/from Stand Sit to Stand: Min assist         General transfer comment: Performed from chair and from toilet, min assist to power up, Vcs for positioning and hand placement. Increased time to perform  Ambulation/Gait Ambulation/Gait assistance: Min guard Ambulation Distance (Feet): 30 Feet Assistive device: Rolling walker (2 wheeled) Gait Pattern/deviations: Step-to pattern;Antalgic Gait velocity: decreased Gait velocity interpretation: Below normal speed for age/gender General Gait Details: heavy reliance on RW  Stairs            Wheelchair Mobility     Modified Rankin (Stroke Patients Only)       Balance Overall balance assessment: No apparent balance deficits (not formally assessed)                                           Pertinent Vitals/Pain Pain Assessment: 0-10 Pain Score: 6  Faces Pain Scale: Hurts whole lot Pain Location: left foot and left groin Pain Descriptors / Indicators: Sore Pain Intervention(s): Monitored during session;Premedicated before session;Repositioned    Home Living Family/patient expects to be discharged to:: Private residence Living Arrangements: Spouse/significant other Available Help at Discharge: Family;Available 24 hours/day Type of Home: House Home Access: Ramped entrance     Home Layout: One level Home Equipment: Shower seat;Grab bars - toilet;Walker - 2 wheels;Wheelchair - manual;Cane - single point;Bedside commode;Hand held shower head (lift chair)      Prior Function Level of Independence: Independent               Hand Dominance   Dominant Hand: Right    Extremity/Trunk Assessment   Upper Extremity Assessment: Overall WFL for tasks assessed           Lower Extremity Assessment: LLE deficits/detail      Cervical / Trunk Assessment: Normal  Communication   Communication: No difficulties  Cognition Arousal/Alertness: Awake/alert Behavior During Therapy: WFL for tasks assessed/performed Overall Cognitive Status: Within Functional Limits for tasks assessed                      General Comments General comments (skin  integrity, edema, etc.): bandage remains intact     Exercises        Assessment/Plan    PT Assessment Patient needs continued PT services  PT Diagnosis Difficulty walking;Abnormality of gait;Acute pain   PT Problem List Decreased strength;Decreased activity tolerance;Decreased balance;Decreased mobility;Decreased knowledge of use of DME;Pain  PT Treatment Interventions DME instruction;Gait training;Functional  mobility training;Therapeutic activities;Therapeutic exercise;Balance training;Patient/family education   PT Goals (Current goals can be found in the Care Plan section) Acute Rehab PT Goals Patient Stated Goal: to go home PT Goal Formulation: With patient Time For Goal Achievement: 08/18/15 Potential to Achieve Goals: Good    Frequency Min 3X/week   Barriers to discharge        Co-evaluation               End of Session Equipment Utilized During Treatment: Gait belt Activity Tolerance: Patient limited by pain Patient left: in chair;with call bell/phone within reach;with family/visitor present Nurse Communication: Mobility status         Time: 1503-      Charges:   PT Evaluation $PT Eval Moderate Complexity: 1 Procedure     PT G CodesFabio Asa Aug 29, 2015, 3:35 PM Charlotte Crumb, PT DPT  [phone]

## 2015-08-04 NOTE — Consult Note (Signed)
ORTHOPAEDIC CONSULTATION  REQUESTING PHYSICIAN: Fransisco Hertz, MD  Chief Complaint: Chronic ulcer left foot status post fifth toe amputation with revascularization to the left lower extremity  HPI: Isabel [name] is a 50 y.o. female who presents with a revascularized to the left lower extremity and residual wound from amputation of the gangrenous little toe. Patient is seen for evaluation of the fifth toe amputation site.  Past Medical History  Diagnosis Date  . Coronary artery disease   . Renal disorder     kidney stones  . CHF (congestive heart failure) (HCC)   . Peripheral vascular disease (HCC)   . Diabetes mellitus without complication (HCC)     Type 2  . Anxiety   . GERD (gastroesophageal reflux disease)    Past Surgical History  Procedure Laterality Date  . Coronary angioplasty with stent placement    . Back surgery    . Kidney stone surgery    . Salpingoophorectomy      "? side"  . Tonsillectomy    . Coronary artery bypass graft N/A 06/08/2013    Procedure: CORONARY ARTERY BYPASS GRAFTING (CABG) times four using left internal mammary and right saphenous vein.;  Surgeon: Delight Ovens, MD;  Location: [facility] OR;  Service: Open Heart Surgery;  Laterality: N/A;  . Intraoperative transesophageal echocardiogram N/A 06/08/2013    Procedure: INTRAOPERATIVE TRANSESOPHAGEAL ECHOCARDIOGRAM;  Surgeon: Delight Ovens, MD;  Location: [facility] OR;  Service: Open Heart Surgery;  Laterality: N/A;  . Left and right heart catheterization with coronary angiogram N/A 06/07/2013    Procedure: LEFT AND RIGHT HEART CATHETERIZATION WITH CORONARY ANGIOGRAM;  Surgeon: Marykay Lex, MD;  Location: [facility] CATH LAB;  Service: Cardiovascular;  Laterality: N/A;  . Peripheral vascular catheterization N/A 07/27/2015    Procedure: Abdominal Aortogram;  Surgeon: Chuck Hint, MD;  Location: [facility] INVASIVE CV LAB;  Service: Cardiovascular;  Laterality: N/A;  . Lower extremity angiogram Bilateral 07/27/2015     Procedure: Lower Extremity Angiogram;  Surgeon: Chuck Hint, MD;  Location: [facility] INVASIVE CV LAB;  Service: Cardiovascular;  Laterality: Bilateral;  . Tubal ligation    . Femoral-popliteal bypass graft Left 08/03/2015    Procedure: BYPASS GRAFT  LEFT FEMORAL TO  BELOW KNEE POPLITEAL ARTERY USING X 80CM GORE PROPATEN GRAFT;  Surgeon: Fransisco Hertz, MD;  Location: [facility] OR;  Service: Vascular;  Laterality: Left;  . Endarterectomy femoral Left 08/03/2015    Procedure: ENDARTERECTOMY LEFT ILIO-FEMORAL PROFUNDA;  Surgeon: Fransisco Hertz, MD;  Location: [facility] OR;  Service: Vascular;  Laterality: Left;  . Patch angioplasty Left 08/03/2015    Procedure: PATCH ANGIOPLASTY RIGHT ILIO-FEMORAL PROFUNDA USING XENOSURE BIOLOGIC PATCH;  Surgeon: Fransisco Hertz, MD;  Location: [facility] OR;  Service: Vascular;  Laterality: Left;  . Amputation toe Left 08/03/2015    Procedure: AMPUTATION LEFT FIFTH TOE;  Surgeon: Fransisco Hertz, MD;  Location: [facility] OR;  Service: Vascular;  Laterality: Left;   Social History   Social History  . Marital Status: Married    Spouse Name: N/A  . Number of Children: N/A  . Years of Education: N/A   Social History Main Topics  . Smoking status: Former Smoker -- 0.50 packs/day for 10 years    Types: Cigarettes    Start date: 12/26/1992    Quit date: 04/15/2000  . Smokeless tobacco: Never Used     Comment: QUIT SMOKING YEARS AGO "  . Alcohol Use: 0.0 oz/week    0 Standard  drinks or equivalent per week     Comment: 07/27/2015 "might have a couple drinks twice/month"  . Drug Use: No  . Sexual Activity: Not Asked   Other Topics Concern  . None   Social History Narrative   Family History  Problem Relation Age of Onset  . Heart disease Mother   . Diabetes Mother   . Stroke Mother   . Cancer Father   . Heart disease Father   . Diabetes Father   . Heart disease Brother   . Diabetes Paternal Aunt   . Stroke Maternal Grandmother   . Diabetes Paternal Grandfather    - negative  except otherwise stated in the family history section No Known Allergies Prior to Admission medications   Medication Sig Start Date End Date Taking? Authorizing Provider  aspirin EC 81 MG tablet Take 81 mg by mouth daily.   Yes Historical Provider, MD  atorvastatin (LIPITOR) 80 MG tablet Take 1 tablet (80 mg total) by mouth daily at 6 PM. 06/18/13  Yes Gina L Collins, PA-C  carvedilol (COREG) 3.125 MG tablet Take 1 tablet (3.125 mg total) by mouth 2 (two) times daily. 11/30/13  Yes Laqueta Linden, MD  cefpodoxime (VANTIN) 100 MG tablet Take 1 tablet (100 mg total) by mouth 2 (two) times daily. 07/29/15  Yes Penny Pia, MD  HUMALOG KWIKPEN 100 UNIT/ML KiwkPen Inject 10 Units into the skin daily as needed. Per sliding scale. For high sugar 10/22/13  Yes Historical Provider, MD  HYDROcodone-acetaminophen (NORCO) 10-325 MG tablet Take 1 tablet by mouth every 6 (six) hours as needed for moderate pain.   Yes Historical Provider, MD  Insulin Detemir (LEVEMIR) 100 UNIT/ML Pen Inject 100 Units into the skin See admin instructions. Take 40 units in the morning, then take 20 units in the afternoon, then take 40 units again to equal out to 100units a day per patient 06/18/13  Yes Wilmon Pali, PA-C  Insulin Pen Needle 29G X MISC 1 box 06/18/13  Yes Wilmon Pali, PA-C  lisinopril (PRINIVIL,ZESTRIL) 5 MG tablet Take 1 tablet (5 mg total) by mouth daily. 06/18/13  Yes Wilmon Pali, PA-C  metroNIDAZOLE (FLAGYL) 500 MG tablet Take 1 tablet (500 mg total) by mouth every 8 (eight) hours. 07/29/15  Yes Penny Pia, MD  omeprazole (PRILOSEC) 20 MG capsule Take 1 capsule by mouth 2 (two) times daily. 11/17/13  Yes Historical Provider, MD  polyethylene glycol (MIRALAX / GLYCOLAX) packet Take 17 g by mouth daily as needed for mild constipation.  06/18/13  Yes Wilmon Pali, PA-C  traMADol (ULTRAM) 50 MG tablet Take 1-2 tablets (50-100 mg total) by mouth every 4 (four) hours as needed for moderate pain. 06/18/13  Yes Wilmon Pali, PA-C  ALPRAZolam (XANAX) 1 MG tablet Take 1 mg by mouth 4 (four) times daily as needed for anxiety.  05/06/13   Historical Provider, MD  PROAIR HFA 108 (90 BASE) MCG/ACT inhaler Inhale 2 puffs into the lungs 4 (four) times daily as needed for shortness of breath.  01/09/15   Historical Provider, MD   No results found. - pertinent xrays, CT, MRI studies were reviewed and independently interpreted  Positive ROS: All other systems have been reviewed and were otherwise negative with the exception of those mentioned in the HPI and as above.  Physical Exam: General: Alert, no acute distress Cardiovascular: No pedal edema Respiratory: No cyanosis, no use of accessory musculature GI: No organomegaly, abdomen is soft and non-tender  Skin: Dry ulcer at the location of the fifth toe amputation. There is no purulence no cellulitis. Neurologic: Patient does not have protective sensation. Psychiatric: Patient is competent for consent with normal mood and affect Lymphatic: No axillary or cervical lymphadenopathy  MUSCULOSKELETAL:  Patient has excellent revascularization to the left lower extremity her foot is warm. She hasn't wound about 10 mm in diameter and about 7 mm deep with no cellulitis no purulence no drainage. The ulcer is dry.  Assessment: Assessment: Dry wound status post fifth toe amputation status post revascularization to the left lower extremity.  Plan: Plan: Agree with continued wound care I will follow-up on Monday. If the wound is not showing improvement would proceed with a fifth ray amputation.  Thank you for the consult and the opportunity to see Ms. Burnett Kanaris, MD [facility] [phone] 6:48 PM

## 2015-08-04 NOTE — Progress Notes (Signed)
Orthopedic Tech Progress Note Patient Details:  Isabel [name] 01/19/1966 [id number]  Ortho Devices Type of Ortho Device: Darco shoe Ortho Device/Splint Location: lle Ortho Device/Splint Interventions: Application   Nikki Dom 08/04/2015, 8:48 AM

## 2015-08-04 NOTE — Anesthesia Postprocedure Evaluation (Signed)
Anesthesia Post Note  Patient: Isabel [name]  Procedure(s) Performed: Procedure(s) (LRB): BYPASS GRAFT  LEFT FEMORAL TO  BELOW KNEE POPLITEAL ARTERY USING X 80CM GORE PROPATEN GRAFT (Left) ENDARTERECTOMY LEFT ILIO-FEMORAL PROFUNDA (Left) PATCH ANGIOPLASTY RIGHT ILIO-FEMORAL PROFUNDA USING XENOSURE BIOLOGIC PATCH (Left) AMPUTATION LEFT FIFTH TOE (Left)  Patient location during evaluation: PACU Anesthesia Type: General Level of consciousness: awake and alert Pain management: pain level controlled Vital Signs Assessment: post-procedure vital signs reviewed and stable Respiratory status: spontaneous breathing, nonlabored ventilation, respiratory function stable and patient connected to nasal cannula oxygen Cardiovascular status: blood pressure returned to baseline and stable Postop Assessment: no signs of nausea or vomiting Anesthetic complications: no    Last Vitals:  Filed Vitals:   08/04/15 1555 08/04/15 1600  BP: 118/62   Pulse: 100   Temp:  37.6 C  Resp: 16     Last Pain:  Filed Vitals:   08/04/15 1907  PainSc: 8                  Javaris Wigington,W. EDMOND

## 2015-08-05 LAB — GLUCOSE, CAPILLARY
Glucose-Capillary: 167 mg/dL — ABNORMAL HIGH (ref 65–99)
Glucose-Capillary: 283 mg/dL — ABNORMAL HIGH (ref 65–99)
Glucose-Capillary: 300 mg/dL — ABNORMAL HIGH (ref 65–99)

## 2015-08-05 NOTE — Progress Notes (Addendum)
[facility]   Subjective  - Painful incisions.  No other complaints.   Objective 107/55 105 98.8 F (37.1 C) (Oral) 18 95% No intake or output data in the 24 hours ending 08/05/15 0811  Doppler biphasic PT/DP/peroneal Incisions soft without hematomas Heart RRR Lungs non labored breathing Wet to dry placed on left 5 th toe amputation site Dry guaze placed over groin incision  Assessment/Planning: POD #2  1. Left iliofemoral endarterectomy with bovine patch angioplasty 2. Extended left profundoplasty  3. Left common femoral artery to below-the-knee popliteal artery bypass with Propaten 4. Left fifth ray amputation  started Levemir 22 units daily per diabetic coordinators recommendations. Appreciate Dr. Audrie Lia in put Ambulate in Darco shoe Transfer to 2w -acute surgical blood loss anemia-tolerating -DVT prophylaxis: Lovenox  Thomasena Edis, EMMA [facility] 08/05/2015 8:11 AM -- Agree with above.  Still very sore in left calf.  Has doppler signals in foot.  Incisions healing To 2W  Fabienne Bruns, MD [facility] [place] Office: [phone] Pager: [phone]  Laboratory Lab Results:  Recent Labs  08/03/15 2040 08/04/15 0430  WBC 13.6* 10.4  HGB 9.4* 8.9*  HCT 29.3* 28.0*  PLT 213 222   BMET  Recent Labs  08/03/15 0833 08/03/15 2040 08/04/15 0430  NA 138  --  136  K 4.8  --  4.6  CL 105  --  102  CO2 22  --  25  GLUCOSE 276*  --  223*  BUN 21*  --  19  CREATININE 0.70 0.81 0.75  CALCIUM 9.3  --  8.4*    COAG Lab Results  Component Value Date   INR 1.10 08/03/2015   INR 1.28 06/08/2013   INR 0.95 06/07/2013   No results found for: PTT

## 2015-08-05 NOTE — Progress Notes (Signed)
Patient Tx to 2W08 from 3S. Patient rating pain 3/10. Refusing pain medication. A&O times 4. Oriented to the room. NO CP. No SOB. Doppler pulses in Left foot. Call bell within reach. Will continue to monitor.   Valinda Hoar RN

## 2015-08-06 ENCOUNTER — Inpatient Hospital Stay (HOSPITAL_COMMUNITY): Payer: BLUE CROSS/BLUE SHIELD

## 2015-08-06 DIAGNOSIS — I739 Peripheral vascular disease, unspecified: Secondary | ICD-10-CM

## 2015-08-06 LAB — GLUCOSE, CAPILLARY
Glucose-Capillary: 233 mg/dL — ABNORMAL HIGH (ref 65–99)
Glucose-Capillary: 200 mg/dL — ABNORMAL HIGH (ref 65–99)
Glucose-Capillary: 193 mg/dL — ABNORMAL HIGH (ref 65–99)
Glucose-Capillary: 201 mg/dL — ABNORMAL HIGH (ref 65–99)

## 2015-08-06 NOTE — Progress Notes (Signed)
PT Cancellation Note  Patient Details Name: MYRTLE BOIE MRN: [id number] DOB: 1965-06-24   Cancelled Treatment:    Reason Eval/Treat Not Completed: Pain limiting ability to participate.  Patient reports severe back pain.  Patient declined PT today.  Will return tomorrow for PT session.   Raejean, Coda 08/06/2015, 7:34 PM Durenda Hurt. Renaldo Fiddler, [facility] Acute Rehab Services Pager [phone]

## 2015-08-06 NOTE — Progress Notes (Signed)
Asked patient if she was ready to ambulate, and patient refused at that time.  Educated patient on ambulation after surgery, and how important it is.  Patient stated she will walk later today. Thanks, Isabel [name]

## 2015-08-06 NOTE — Progress Notes (Signed)
VASCULAR LAB PRELIMINARY  ARTERIAL  ABI completed:ABIs indicate improvement in arterial blood flow, post op.    RIGHT    LEFT    PRESSURE WAVEFORM  PRESSURE WAVEFORM  BRACHIAL 133 T BRACHIAL 139 T  DP   DP    AT 123 M-B AT 114 M-B  PT 104 M-B PT 114 M-B  PER   PER    GREAT TOE  NA GREAT TOE  NA    RIGHT LEFT  ABI 0.88 0.82     Coral Timme, RVT 08/06/2015, 4:29 PM

## 2015-08-06 NOTE — Progress Notes (Addendum)
[facility] [place]  Subjective  - Not as sore today.  Moving around better to the bathroom.   Objective 114/55 100 101 F (38.3 C) (Oral) 18 91%  Intake/Output Summary (Last 24 hours) at 08/06/15 0820 Last data filed at 08/06/15 0532  Gross per 24 hour  Intake   1080 ml  Output      1 ml  Net   1079 ml    Doppler biphasic left DP/PT Incisions healing well dry guaze to left groin Left fifth toe amputation site wet to dry changed.  Yellow eschar base. Less erythema.    Assessment/Planning: POD #3 1. Left iliofemoral endarterectomy with bovine patch angioplasty 2. Extended left profundoplasty  3. Left common femoral artery to below-the-knee popliteal artery bypass with Propaten 4. Left fifth ray amputation   Dr. Lajoyce Corners will re exam the left fifth toe amputation site tomorrow, by pass patent good doppler signals.  Clinton Gallant [facility] 08/06/2015 8:20 AM -- Incisions healing.  Pt says she is walking better Possible d/c tomorrow.  Fabienne Bruns, MD [facility] [place] Office: [phone] Pager: [phone]  Laboratory Lab Results:  Recent Labs  08/03/15 2040 08/04/15 0430  WBC 13.6* 10.4  HGB 9.4* 8.9*  HCT 29.3* 28.0*  PLT 213 222   BMET  Recent Labs  08/03/15 0833 08/03/15 2040 08/04/15 0430  NA 138  --  136  K 4.8  --  4.6  CL 105  --  102  CO2 22  --  25  GLUCOSE 276*  --  223*  BUN 21*  --  19  CREATININE 0.70 0.81 0.75  CALCIUM 9.3  --  8.4*    COAG Lab Results  Component Value Date   INR 1.10 08/03/2015   INR 1.28 06/08/2013   INR 0.95 06/07/2013   No results found for: PTT

## 2015-08-07 ENCOUNTER — Telehealth: Payer: Self-pay | Admitting: Vascular Surgery

## 2015-08-07 LAB — GLUCOSE, CAPILLARY
Glucose-Capillary: 181 mg/dL — ABNORMAL HIGH (ref 65–99)
Glucose-Capillary: 207 mg/dL — ABNORMAL HIGH (ref 65–99)
Glucose-Capillary: 217 mg/dL — ABNORMAL HIGH (ref 65–99)

## 2015-08-07 MED ORDER — OXYCODONE-ACETAMINOPHEN 5-325 MG PO TABS: 1.0000 | ORAL_TABLET | Freq: Four times a day (QID) | ORAL | Status: DC | PRN

## 2015-08-07 MED ORDER — COLLAGENASE 250 UNIT/GM EX OINT
TOPICAL_OINTMENT | Freq: Every day | CUTANEOUS | Status: DC
  Filled 2015-08-07: qty 30

## 2015-08-07 MED ORDER — COLLAGENASE 250 UNIT/GM EX OINT: TOPICAL_OINTMENT | Freq: Every day | CUTANEOUS | Status: DC

## 2015-08-07 NOTE — Discharge Summary (Signed)
Discharge Summary     Isabel [name] 1966/03/31 50 y.o. female  [id number]  Admission Date: 08/03/2015  Discharge Date: 08/07/15  Physician: Fransisco Hertz, MD  Admission Diagnosis: Peripheral vascular disease with left fifth toe ulcer I70.245   HPI:   This is a 50 y.o. female who presents for evaluation of gangrene left fifth toe. Patient developed blister on left fifth toe about 6-8 weeks ago. This has gradually progressed to dry gangrene. She was seen in the wound center earlier today. She came to the emergency department or pain. She has not had a vascular evaluation. She does have a previous history of claudication in the left calf after walking about 1 block. She has no previous history of nonhealing ulcers infection or gangrene. She has no symptoms in the contralateral right leg. She did have coronary artery bypass grafting in 2015. She quit smoking about 20 years ago. She has type 1 diabetes mellitus. She has had no chills and fever.  Hospital Course:  The patient was admitted to the hospital and taken to the operating room on 08/03/2015 and underwent: 1. Left iliofemoral endarterectomy with bovine patch angioplasty 2. Extended left profundoplasty  3. Left common femoral artery to below-the-knee popliteal artery bypass with Propaten 4. Left fifth ray amputation    The pt tolerated the procedure well and was transported to the PACU in good condition.  A Darco shoe was ordered for the pt for heel weight bearing on ly.  By POD 1, the wound was somewhat dusky.  Dr. Lajoyce Corners was consulted.    By POD 2, the pt was transferred to 2 west.  She did have acute surgical blood loss anemia and was tolerating.  Post operative ABI's on 08/06/15:  RIGHT    LEFT    PRESSURE WAVEFORM  PRESSURE WAVEFORM  BRACHIAL 133 T BRACHIAL 139 T  DP   DP    AT 123 M-B AT 114 M-B  PT 104 M-B PT 114 M-B  PER   PER    GREAT TOE  NA GREAT TOE  NA     RIGHT LEFT  ABI 0.88 0.82       Dr. Lajoyce Corners re-evaluated the wound on 08/07/15 and states the wound is improving.  He has started [employer] dressing changes, which she will continue at home.  All other wounds are healing nicely.   The remainder of the hospital course consisted of increasing mobilization and increasing intake of solids without difficulty.  CBC    Component Value Date/Time   WBC 10.4 08/04/2015 0430   RBC 2.99* 08/04/2015 0430   RBC 3.34* 07/27/2015 0601   HGB 8.9* 08/04/2015 0430   HCT 28.0* 08/04/2015 0430   HCT 30.7* 07/27/2015 0601   PLT 222 08/04/2015 0430   MCV 93.6 08/04/2015 0430   MCH 29.8 08/04/2015 0430   MCHC 31.8 08/04/2015 0430   RDW 13.5 08/04/2015 0430   LYMPHSABS 1.6 07/27/2015 0601   MONOABS 0.4 07/27/2015 0601   EOSABS 0.2 07/27/2015 0601   BASOSABS 0.0 07/27/2015 0601    BMET    Component Value Date/Time   NA 136 08/04/2015 0430   K 4.6 08/04/2015 0430   CL 102 08/04/2015 0430   CO2 25 08/04/2015 0430   GLUCOSE 223* 08/04/2015 0430   BUN 19 08/04/2015 0430   CREATININE 0.75 08/04/2015 0430   CALCIUM 8.4* 08/04/2015 0430   GFRNONAA >60 08/04/2015 0430   GFRAA >60 08/04/2015 0430     Discharge Instructions  Call MD for:  redness, tenderness, or signs of infection (pain, swelling, bleeding, redness, odor or green/yellow discharge around incision site)    Complete by:  As directed      Call MD for:  severe or increased pain, loss or decreased feeling  in affected limb(s)    Complete by:  As directed      Call MD for:  temperature >100.5    Complete by:  As directed      Discharge instructions    Complete by:  As directed   Heel weight bearing only left foot. Continue to use Darco shoe.     Discharge wound care:    Complete by:  As directed   Wash the groin wound with soap and water daily and pat dry. (No tub bath-only shower)  Then put a dry gauze or washcloth there to keep this area dry daily and as needed.  Do not use  Vaseline or neosporin on your incisions.  Only use soap and water on your incisions and then protect and keep dry.  Apply Santyl to right 5th toe amputation site followed by wet to dry saline dressing changes then Kerlix and Ace wrap.     Driving Restrictions    Complete by:  As directed   No driving for 2 weeks     Lifting restrictions    Complete by:  As directed   No lifting for 4 weeks     Resume previous diet    Complete by:  As directed            Discharge Diagnosis:  Peripheral vascular disease with left fifth toe ulcer I70.245  Secondary Diagnosis: Patient Active Problem List   Diagnosis Date Noted  . PAD (peripheral artery disease) (HCC) 08/03/2015  . Gangrene of toe (HCC) 07/26/2015  . Gangrene (HCC) 07/26/2015  . 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 Mild aortic stenosis 06/05/2013  . DM (diabetes mellitus), type 2, uncontrolled (HCC) 06/02/2013  . CAD (coronary artery disease), status post stents 2004 06/02/2013  . Anemia 06/02/2013  . Constipation 06/02/2013   Past Medical History  Diagnosis Date  . Coronary artery disease   . Renal disorder     kidney stones  . CHF (congestive heart failure) (HCC)   . Peripheral vascular disease (HCC)   . Diabetes mellitus without complication (HCC)     Type 2  . Anxiety   . GERD (gastroesophageal reflux disease)        Medication List    TAKE these medications        ALPRAZolam 1 MG tablet  Commonly known as:  XANAX  Take 1 mg by mouth 4 (four) times daily as needed for anxiety.     aspirin EC 81 MG tablet  Take 81 mg by mouth daily.     atorvastatin 80 MG tablet  Commonly known as:  LIPITOR  Take 1 tablet (80 mg total) by mouth daily at 6 PM.     carvedilol 3.125 MG tablet  Commonly known as:  COREG  Take 1 tablet (3.125 mg total) by mouth 2 (two) times daily.     cefpodoxime 100 MG tablet  Commonly known as:  VANTIN  Take 1 tablet (100 mg total) by mouth 2 (two) times daily.     collagenase ointment    Commonly known as:  SANTYL  Apply topically daily.     HUMALOG KWIKPEN 100 UNIT/ML KiwkPen  Generic drug:  insulin lispro  Inject 10 Units  into the skin daily as needed. Per sliding scale. For high sugar     HYDROcodone-acetaminophen 10-325 MG tablet  Commonly known as:  NORCO  Take 1 tablet by mouth every 6 (six) hours as needed for moderate pain.     Insulin Detemir 100 UNIT/ML Pen  Commonly known as:  LEVEMIR  Inject 100 Units into the skin See admin instructions. Take 40 units in the morning, then take 20 units in the afternoon, then take 40 units again to equal out to 100units a day per patient     Insulin Pen Needle 29G X Misc  1 box     lisinopril 5 MG tablet  Commonly known as:  PRINIVIL,ZESTRIL  Take 1 tablet (5 mg total) by mouth daily.     metroNIDAZOLE 500 MG tablet  Commonly known as:  FLAGYL  Take 1 tablet (500 mg total) by mouth every 8 (eight) hours.     omeprazole 20 MG capsule  Commonly known as:  PRILOSEC  Take 1 capsule by mouth 2 (two) times daily.     oxyCODONE-acetaminophen 5-325 MG tablet  Commonly known as:  PERCOCET/ROXICET  Take 1-2 tablets by mouth every 6 (six) hours as needed for moderate pain.     polyethylene glycol packet  Commonly known as:  MIRALAX / GLYCOLAX  Take 17 g by mouth daily as needed for mild constipation.     PROAIR HFA 108 (90 Base) MCG/ACT inhaler  Generic drug:  albuterol  Inhale 2 puffs into the lungs 4 (four) times daily as needed for shortness of breath.     traMADol 50 MG tablet  Commonly known as:  ULTRAM  Take 1-2 tablets (50-100 mg total) by mouth every 4 (four) hours as needed for moderate pain.        Prescriptions given: 1.  Percocet #20 No Refill 2,  Santyl  Instructions: 1.  Wash the groin wound with soap and water daily and pat dry. (No tub bath-only shower)  Then put a dry gauze or washcloth there to keep this area dry daily and as needed.  Do not use Vaseline or neosporin on your incisions.   Only use soap and water on your incisions and then protect and keep dry. 2.  Santyl followed by wet to dry saline dressing changes daily to left 5th toe amputation site followed by Kerlix and ACE wrap. 3.  Heel weight bearing only.  Continue Darco shoe.  Disposition: home  Patient's condition: is Good  Follow up: 1. Dr. Imogene Burn in 2 weeks 2. Dr. Lajoyce Corners in 1 week   Doreatha Massed, PA-C [facility] [phone] 08/07/2015  10:03 AM  Addendum  I have independently interviewed and examined the patient, and I agree with the physician assistant's discharge summary.  This patient was intially seen by Dr. Hart Rochester and her work-up suggested she needed a left CFA to BK popliteal bypass to try to heal a left 5th gangrenous toe with associate cellulitis in the left foot.  She underwent a short course of cellulitis and was discharged by the Hospitalist service.  She returned this admission for the L CFA to BK pop bypass with propaten.  This was done with Propaten as her GSV was inadequate, only 2 mm at the calf level.  The CFA and profunda femoral artery turned out to be severely diseased and calcified and an iliofemoral endarterectomy with bovine patch angioplasty from the distal external iliac artery into the profunda femoral artery were necessary.  She  also had her gangrenous L 5th toe removed on presentation.  Unfortunately, she had some ischemic tissue at the base of the 5th toe so the wound had to be left open.  This wound also had inadequate bleeding, so I had Dr. Lajoyce Corners evaluate her.  Her ABI reflect the augmentation in the blood flow to her left foot.  Since her surgery, the left 5th toe amputation wound has improved and Dr. Lajoyce Corners feels the wound might be viable.  He will continue to see her as an outpatient.  She will follow up with me in 2 weeks for wound check.  I emphasized to her the importance of good wound care to the groin and left foot.  She has agreed to be compliant with such.  I  also discussed multiple times with her the importance of getting control of her uncontrolled DM, which will ultimately limit the benefit of her intervention without optimization of such.   Leonides Sake, MD [facility] [place] Office: [phone] Pager: [phone]  08/07/2015, 11:17 AM   - For VQI Registry use --- Instructions: Press F2 to tab through selections.  Delete question if not applicable.   Post-op:  Wound infection: No  Graft infection: No  Transfusion: No  If yes, n/a units given New Arrhythmia: No Ipsilateral amputation: No,  Minor,  BKA,  AKA Discharge patency: [x ] Primary,  Primary assisted,  Secondary,  Occluded Patency judged by: [x ] Dopper only,  Palpable graft pulse,  Palpable distal pulse,  ABI inc. > 0.15,  Duplex Discharge ABI: R 0.88, L 0.82 D/C Ambulatory Status: Ambulatory with Assistance  Complications: MI: No,  Troponin only,  EKG or Clinical CHF: No Resp failure:No,  Pneumonia,  Ventilator Chg in renal function: No,  Inc. Cr > 0.5,  Temp. Dialysis,  Permanent dialysis Stroke: No,  Minor,  Major Return to OR: No  Reason for return to OR:  Bleeding,  Infection,  Thrombosis,  Revision  Discharge medications: Statin use:  yes ASA use:  yes Plavix use:  no Beta blocker use: yes ACEI use:   yes ARB use:  no Coumadin use: no

## 2015-08-07 NOTE — Care Management Note (Signed)
Case Management Note Previous CM note initiated by Letha Cape RN, CM  Patient Details  Name: Isabel [name] MRN: [id number] Date of Birth: 11/07/1965  Subjective/Objective:    Patient lives with her spouse, s/p fem poip and l toe amuptation, she has a boot on that foot, also per pt/ot eval rec hhpt/hhot, will also need HHRN for dressing changes.  Patient chose [facility], referral made to [facility] with [facility] for [facility], HHPt, HHOT.  Soc will begin 24-48 hrs post dc.  Patient will also need 3 n 1, NCM  will need to contact [facility] for DME. Before discharge.                 Action/Plan: Pt s/p fempop- plan to d/c home with spouse- 4/24- spoke with pt at bedside- she confirms that she already has BSC, shower chair, RW and cane at home -states that she does not need any DME for discharge- confirmed that she will be using [facility] for [facility] services- spoke with Judeth Cornfield regarding d/c for today and wound care orders - pt to have HH-RN/PT/OT  Expected Discharge Date:    08/07/15              Expected Discharge Plan:  Home w Home Health Services  In-House Referral:     Discharge planning Services  CM Consult  Post Acute Care Choice:  Home Health Choice offered to:  Patient  DME Arranged:  3-N-1 DME Agency:  [facility]  HH Arranged:  RN, PT, OT [facility] Agency:  [facility]  Status of Service:  In process, will continue to follow  Medicare Important Message Given:    Date Medicare IM Given:    Medicare IM give by:    Date Additional Medicare IM Given:    Additional Medicare Important Message give by:     If discussed at Long Length of Stay Meetings, dates discussed:    Additional Comments:  Darrold Span, RN 08/07/2015, 2:42 PM

## 2015-08-07 NOTE — Progress Notes (Signed)
Physical Therapy Treatment Patient Details Name: TANAIRY PAYEUR MRN: [id number] DOB: [birth year] Today's Date: 08/07/2015    History of Present Illness 50 y.o. female s/p L iliofemoral endarterectomy with bovine patch angioplasty, L common femoral artery to below the knee popliteal artery bypass with propaten L 5th ray amputation PMH: IDDM CAD s/p CABG x4 2015     PT Comments    Pt moving well and demonstrates good use of RW.  Discussed mobility at home and pt expresses no concerns.  Feel pt is ready for D/C from PT standpoint.    Follow Up Recommendations  Home health PT;Supervision - Intermittent     Equipment Recommendations  None recommended by PT    Recommendations for Other Services       Precautions / Restrictions Precautions Precautions: Fall Required Braces or Orthoses: Other Brace/Splint Other Brace/Splint: L Forefoot off-loading shoe Restrictions Weight Bearing Restrictions: No LLE Partial Weight Bearing Percentage or Pounds: heel only Other Position/Activity Restrictions: darco shoe L LE    Mobility  Bed Mobility Overal bed mobility: Modified Independent                Transfers Overall transfer level: Needs assistance Equipment used: Rolling walker (2 wheeled) Transfers: Sit to/from Stand Sit to Stand: Supervision         General transfer comment: cues to get closer to bed prior to returning to sitting.    Ambulation/Gait Ambulation/Gait assistance: Supervision Ambulation Distance (Feet): 100 Feet Assistive device: Rolling walker (2 wheeled) Gait Pattern/deviations: Step-to pattern;Decreased step length - right;Decreased stance time - left     General Gait Details: cues for decreasing step length on L LE and increasing R LE step length.  pt does well keeping weight to heel of L LE.     Stairs            Wheelchair Mobility    Modified Rankin (Stroke Patients Only)       Balance Overall balance assessment: Needs assistance         Standing balance support: Single extremity supported;Bilateral upper extremity supported;During functional activity Standing balance-Leahy Scale: Fair                      Cognition Arousal/Alertness: Awake/alert Behavior During Therapy: WFL for tasks assessed/performed Overall Cognitive Status: Within Functional Limits for tasks assessed                      Exercises      General Comments        Pertinent Vitals/Pain Pain Assessment: 0-10 Pain Score: 1  Pain Location: L foot Pain Descriptors / Indicators: Throbbing Pain Intervention(s): Monitored during session;Repositioned    Home Living                      Prior Function            PT Goals (current goals can now be found in the care plan section) Acute Rehab PT Goals Patient Stated Goal: go home PT Goal Formulation: With patient Time For Goal Achievement: 08/18/15 Potential to Achieve Goals: Good Progress towards PT goals: Progressing toward goals    Frequency  Min 3X/week    PT Plan Current plan remains appropriate    Co-evaluation             End of Session Equipment Utilized During Treatment: Gait belt Activity Tolerance: Patient tolerated treatment well Patient left: in bed;with call bell/phone within reach  Time: 5361-4431 PT Time Calculation (min) (ACUTE ONLY): 10 min  Charges:  $Gait Training: 8-22 mins                    G CodesSunny Schlein, [place] [phone] 08/07/2015, 10:41 AM

## 2015-08-07 NOTE — Progress Notes (Signed)
Patient discharged home. She was educated on medications and discharge instructions. She stated that she understood. IV was dc'd and was intact. Bandage on the foot was changed.

## 2015-08-07 NOTE — Telephone Encounter (Signed)
-----   Message from Sharee Pimple, RN sent at 08/07/2015 10:54 AM EDT ----- Regarding: schedule   ----- Message -----    From: Dara Lords, PA-C    Sent: 08/07/2015  10:02 AM      To: Vvs Charge Pool  S/p left fem pop and 5th toe amp.  F/u with Dr. Imogene Burn in 2 weeks.  Thanks, Lelon Mast

## 2015-08-07 NOTE — Progress Notes (Addendum)
   Daily Progress Note  Assessment/Planning: POD #4 s/p L fem-BK pop BPG, L 5th toe amp   Dr. Lajoyce Corners feels L 5th open MT amp is improved.  Recommends: Santyl and wet-to-dry dressings to L5th toe.  Follow up in office   Ok to D/C once wound care set up  Subjective  - 4 Days Post-Op  Pain tolerable, ambulating ok  Objective Filed Vitals:   08/06/15 0900 08/06/15 1324 08/06/15 1700 08/07/15 0440  BP: 104/47 116/58  118/62  Pulse: 83 78  81  Temp: 98.3 F (36.8 C) 98.3 F (36.8 C)  98.2 F (36.8 C)  TempSrc: Oral Oral  Oral  Resp: 18 18  18   Height:      Weight:      SpO2: 98% 95% 98% 96%    Intake/Output Summary (Last 24 hours) at 08/07/15 0955 Last data filed at 08/07/15 9326  Gross per 24 hour  Intake    600 ml  Output      2 ml  Net    598 ml    VASC  L groin: inc c/d/i, L calf: inc c/d/i, L foot bandaged  Laboratory CBC    Component Value Date/Time   WBC 10.4 08/04/2015 0430   HGB 8.9* 08/04/2015 0430   HCT 28.0* 08/04/2015 0430   HCT 30.7* 07/27/2015 0601   PLT 222 08/04/2015 0430    BMET    Component Value Date/Time   NA 136 08/04/2015 0430   K 4.6 08/04/2015 0430   CL 102 08/04/2015 0430   CO2 25 08/04/2015 0430   GLUCOSE 223* 08/04/2015 0430   BUN 19 08/04/2015 0430   CREATININE 0.75 08/04/2015 0430   CALCIUM 8.4* 08/04/2015 0430   GFRNONAA >60 08/04/2015 0430   GFRAA >60 08/04/2015 0430    Leonides Sake, MD [facility] [place] Office: [phone] Pager: [phone]  08/07/2015, 9:55 AM     Addendum  Surgical pathology demonstrates some osteomyelitis.  I resected the entirety of the left 5th toe and head of the 5th MT, so theoretically the residual osteomyelitis has been removed.  Will continue with Santyl ointment to the left 5th toe wound.  Leonides Sake, MD [facility] [place] Office: [phone] Pager: [phone]  08/07/2015, 11:25 AM

## 2015-08-07 NOTE — Telephone Encounter (Signed)
sched appt 5/12 at 10:15. Lm on hm# to inform pt of appt.

## 2015-08-07 NOTE — Progress Notes (Signed)
Patient ID: Isabel [name], female   DOB: 08/13/1965, 50 y.o.   MRN: [id number] patient's left foot continues to improve status post revascularization to left lower extremity. We will start Santyl dressing changes to the left fifth toe wound. I will follow-up in the office as an outpatient. No need for urgent surgical intervention at this time.

## 2015-08-07 NOTE — Progress Notes (Addendum)
Occupational Therapy Treatment Patient Details Name: Isabel [name] MRN: [id number] DOB: Dec 10, 1965 Today's Date: 08/07/2015    History of present illness 50 y.o. female s/p L iliofemoral endarterectomy with bovine patch angioplasty, L common femoral artery to below the knee popliteal artery bypass with propaten L 5th ray amputation PMH: IDDM CAD s/p CABG x4 2015    OT comments  Pt progressing. Updated d/c recommendation to No OT follow up. OT signing off.  Follow Up Recommendations  No OT follow up;Supervision - Intermittent    Equipment Recommendations  None recommended by OT    Recommendations for Other Services      Precautions / Restrictions Precautions Precautions: Fall Required Braces or Orthoses: Other Brace/Splint (left darco shoe) Restrictions LLE Partial Weight Bearing Percentage or Pounds: heel only Other Position/Activity Restrictions: darco shoe L LE       Mobility Bed Mobility Overal bed mobility: Modified Independent                Transfers Overall transfer level: Needs assistance   Transfers: Sit to/from Stand Sit to Stand: Supervision         General transfer comment: also set up for RW    Balance      Min guard for simulated shower transfer. Stood at sink and performed ADLs without physical assist for balance.                             ADL Overall ADL's : Needs assistance/impaired     Grooming: Wash/dry face;Oral care;Supervision/safety;Standing Grooming Details (indicate cue type and reason): pt went and retreived washcloth and most other items at sink-OT retrieved small basin. Upper Body Bathing: Supervision/ safety;Standing Upper Body Bathing Details (indicate cue type and reason): rinsed off arms         Lower Body Dressing: Sit to/from stand;Minimal assistance Lower Body Dressing Details (indicate cue type and reason): OT retrieved bag for pt and pt donned pants and pt also donned right shoe. Unable to fully  reach to Lt foot. Toilet Transfer: Supervision/safety;Ambulation;RW (also set up for RW)       Tub/ Shower Transfer: Walk-in shower;Min guard;Ambulation (practiced stepping over simulated shower threshold)   Functional mobility during ADLs:  (Supervision-Min guard (also set up for RW)) General ADL Comments: Educated on safety such as sitting for LB ADLs, safe footwear, and bag on walker.Educated on LB dressing technique. Educated on shower transfer technique and recommended someone be with her for shower transfer. Discussed AE. Pt reports she has 3 in 1, shower chair, and tub bench.      Vision                     Perception     Praxis      Cognition  Awake/Alert Behavior During Therapy: WFL for tasks assessed/performed Overall Cognitive Status: Within Functional Limits for tasks assessed                       Extremity/Trunk Assessment               Exercises     Shoulder Instructions       General Comments      Pertinent Vitals/ Pain       Pain Assessment: 0-10 Pain Score: 1  Pain Location: Lt foot and groin  Pain Descriptors / Indicators: Throbbing Pain Intervention(s): Monitored during session  Home Living  Prior Functioning/Environment              Frequency Min 2X/week     Progress Toward Goals  OT Goals(current goals can now be found in the care plan section)  Progress towards OT goals: Progressing toward goals-adequate for d/c  Acute Rehab OT Goals Patient Stated Goal: go home OT Goal Formulation: With patient Time For Goal Achievement: 08/18/15 Potential to Achieve Goals: Good ADL Goals Pt Will Perform Grooming: with min guard assist (sink level) Pt Will Perform Upper Body Bathing: with supervision;sitting Pt Will Transfer to Toilet: with supervision;bedside commode;ambulating Pt Will Perform Tub/Shower Transfer: Shower transfer;ambulating;rolling walker;shower  seat;with supervision;with set-up Additional ADL Goal #1: pt will complete bed mobility mod I level as precursor to adls  Plan Discharge plan needs to be updated;Equipment recommendations need to be updated    Co-evaluation                 End of Session Equipment Utilized During Treatment: Rolling walker   Activity Tolerance Patient tolerated treatment well   Patient Left in bed;with call bell/phone within reach   Nurse Communication          Time: [phone] OT Time Calculation (min): 14 min  Charges: OT General Charges $OT Visit: 1 Procedure OT Treatments $Self Care/Home Management : 8-22 mins  Earlie Raveling OTR/L [phone] 08/07/2015, 9:57 AM

## 2015-08-10 ENCOUNTER — Encounter: Payer: Self-pay | Admitting: Radiology

## 2015-08-11 ENCOUNTER — Emergency Department (HOSPITAL_COMMUNITY)
Admission: EM | Admit: 2015-08-11 | Discharge: 2015-08-11 | Disposition: A | Discharge status: Home / Self Care | Payer: BLUE CROSS/BLUE SHIELD | Attending: Emergency Medicine | Admitting: Emergency Medicine

## 2015-08-11 ENCOUNTER — Emergency Department (HOSPITAL_COMMUNITY): Payer: BLUE CROSS/BLUE SHIELD

## 2015-08-11 ENCOUNTER — Telehealth: Payer: Self-pay

## 2015-08-11 ENCOUNTER — Encounter (HOSPITAL_COMMUNITY): Payer: Self-pay

## 2015-08-11 DIAGNOSIS — E1151 Type 2 diabetes mellitus with diabetic peripheral angiopathy without gangrene: Secondary | ICD-10-CM | POA: Diagnosis not present

## 2015-08-11 DIAGNOSIS — Z87891 Personal history of nicotine dependence: Secondary | ICD-10-CM | POA: Insufficient documentation

## 2015-08-11 DIAGNOSIS — I509 Heart failure, unspecified: Secondary | ICD-10-CM | POA: Diagnosis not present

## 2015-08-11 DIAGNOSIS — I2510 Atherosclerotic heart disease of native coronary artery without angina pectoris: Secondary | ICD-10-CM | POA: Insufficient documentation

## 2015-08-11 DIAGNOSIS — M7989 Other specified soft tissue disorders: Secondary | ICD-10-CM | POA: Insufficient documentation

## 2015-08-11 DIAGNOSIS — R7989 Other specified abnormal findings of blood chemistry: Secondary | ICD-10-CM | POA: Insufficient documentation

## 2015-08-11 DIAGNOSIS — M79605 Pain in left leg: Secondary | ICD-10-CM

## 2015-08-11 LAB — COMPREHENSIVE METABOLIC PANEL
ALBUMIN: 2.6 g/dL — AB (ref 3.5–5.0)
ALK PHOS: 63 U/L (ref 38–126)
ALT: 16 U/L (ref 14–54)
AST: 14 U/L — AB (ref 15–41)
Anion gap: 7 (ref 5–15)
BILIRUBIN TOTAL: 0.3 mg/dL (ref 0.3–1.2)
BUN: 26 mg/dL — AB (ref 6–20)
CO2: 24 mmol/L (ref 22–32)
CREATININE: 0.82 mg/dL (ref 0.44–1.00)
Calcium: 8.9 mg/dL (ref 8.9–10.3)
Chloride: 105 mmol/L (ref 101–111)
GFR calc Af Amer: >60 mL/min (ref >60)
GLUCOSE: 344 mg/dL — AB (ref 65–99)
Potassium: 4.7 mmol/L (ref 3.5–5.1)
Sodium: 136 mmol/L (ref 135–145)
TOTAL PROTEIN: 6.4 g/dL — AB (ref 6.5–8.1)

## 2015-08-11 LAB — CBC WITH DIFFERENTIAL/PLATELET
BASOS ABS: 0.1 10*3/uL (ref 0.0–0.1)
Basophils Relative: 1 %
EOS PCT: 5 %
Eosinophils Absolute: 0.5 10*3/uL (ref 0.0–0.7)
HEMATOCRIT: 25.9 % — AB (ref 36.0–46.0)
Hemoglobin: 8.4 g/dL — ABNORMAL LOW (ref 12.0–15.0)
Lymphocytes Relative: 14 %
Lymphs Abs: 1.5 10*3/uL (ref 0.7–4.0)
MCH: 31.2 pg (ref 26.0–34.0)
MCHC: 32.4 g/dL (ref 30.0–36.0)
MCV: 96.3 fL (ref 78.0–100.0)
MONO ABS: 0.5 10*3/uL (ref 0.1–1.0)
MONOS PCT: 5 %
NEUTROS ABS: 8 10*3/uL — AB (ref 1.7–7.7)
Neutrophils Relative %: 75 %
PLATELETS: 351 10*3/uL (ref 150–400)
RBC: 2.69 MIL/uL — ABNORMAL LOW (ref 3.87–5.11)
RDW: 14.1 % (ref 11.5–15.5)
WBC: 10.6 10*3/uL — ABNORMAL HIGH (ref 4.0–10.5)

## 2015-08-11 LAB — BRAIN NATRIURETIC PEPTIDE: B NATRIURETIC PEPTIDE 5: 394 pg/mL — AB (ref 0.0–100.0)

## 2015-08-11 MED ORDER — FUROSEMIDE 20 MG PO TABS: 20.0000 mg | ORAL_TABLET | Freq: Every day | ORAL | Status: DC

## 2015-08-11 MED ORDER — HYDROMORPHONE HCL 1 MG/ML IJ SOLN
1.0000 mg | Freq: Once | INTRAMUSCULAR | Status: AC
Start: 2015-08-11 — End: 2015-08-11
  Administered 2015-08-11: 1 mg via INTRAMUSCULAR
  Filled 2015-08-11: qty 1

## 2015-08-11 MED ORDER — FUROSEMIDE 40 MG PO TABS
20.0000 mg | ORAL_TABLET | Freq: Once | ORAL | Status: AC
  Administered 2015-08-11: 20 mg via ORAL
  Filled 2015-08-11: qty 1

## 2015-08-11 MED ORDER — KETOROLAC TROMETHAMINE 30 MG/ML IJ SOLN
30.0000 mg | Freq: Once | INTRAMUSCULAR | Status: AC
  Administered 2015-08-11: 30 mg via INTRAMUSCULAR
  Filled 2015-08-11: qty 1

## 2015-08-11 MED ORDER — OXYCODONE-ACETAMINOPHEN 10-325 MG PO TABS: 1.0000 | ORAL_TABLET | Freq: Three times a day (TID) | ORAL | Status: DC | PRN

## 2015-08-11 NOTE — ED Notes (Signed)
Pt in xray

## 2015-08-11 NOTE — ED Provider Notes (Signed)
CSN: [id number]     Arrival date & time 08/11/15  1617 History   First MD Initiated Contact with Patient 08/11/15 1634     Chief Complaint  Patient presents with  . Leg Swelling     (Consider location/radiation/quality/duration/timing/severity/associated sxs/prior Treatment) HPI   Patient presents one week after vascular surgery, now with concern of diffuse swelling throughout the left leg, pain in the left hip. No new chest pain, dyspnea. Symptoms have developed over the past few days, after initially feeling better on discharge home. Patient has a notable history of CHF, poorly controlled diabetes. Slightly more than 1 week ago, the patient was found to have gangrenous left toe. She was admitted to our affiliated facility, had bypass procedure in the left lower extremity, as well as amputation of the left fifth digit. Now for the past 2 or 3 days patient has had increasing pain, swelling throughout the left leg, pain in the left hip, but no new chest pain, belly pain, syncope, fever, chills, cough. Minimal relief with home narcotic use.   Past Medical History  Diagnosis Date  . Coronary artery disease   . Renal disorder     kidney stones  . CHF (congestive heart failure) (HCC)   . Peripheral vascular disease (HCC)   . Diabetes mellitus without complication (HCC)     Type 2  . Anxiety   . GERD (gastroesophageal reflux disease)    Past Surgical History  Procedure Laterality Date  . Coronary angioplasty with stent placement    . Back surgery    . Kidney stone surgery    . Salpingoophorectomy      "? side"  . Tonsillectomy    . Coronary artery bypass graft N/A 06/08/2013    Procedure: CORONARY ARTERY BYPASS GRAFTING (CABG) times four using left internal mammary and right saphenous vein.;  Surgeon: Delight Ovens, MD;  Location: [facility] OR;  Service: Open Heart Surgery;  Laterality: N/A;  . Intraoperative transesophageal echocardiogram N/A 06/08/2013    Procedure:  INTRAOPERATIVE TRANSESOPHAGEAL ECHOCARDIOGRAM;  Surgeon: Delight Ovens, MD;  Location: [facility] OR;  Service: Open Heart Surgery;  Laterality: N/A;  . Left and right heart catheterization with coronary angiogram N/A 06/07/2013    Procedure: LEFT AND RIGHT HEART CATHETERIZATION WITH CORONARY ANGIOGRAM;  Surgeon: Marykay Lex, MD;  Location: [facility] CATH LAB;  Service: Cardiovascular;  Laterality: N/A;  . Peripheral vascular catheterization N/A 07/27/2015    Procedure: Abdominal Aortogram;  Surgeon: Chuck Hint, MD;  Location: [facility] INVASIVE CV LAB;  Service: Cardiovascular;  Laterality: N/A;  . Lower extremity angiogram Bilateral 07/27/2015    Procedure: Lower Extremity Angiogram;  Surgeon: Chuck Hint, MD;  Location: [facility] INVASIVE CV LAB;  Service: Cardiovascular;  Laterality: Bilateral;  . Tubal ligation    . Femoral-popliteal bypass graft Left 08/03/2015    Procedure: BYPASS GRAFT  LEFT FEMORAL TO  BELOW KNEE POPLITEAL ARTERY USING X 80CM GORE PROPATEN GRAFT;  Surgeon: Fransisco Hertz, MD;  Location: [facility] OR;  Service: Vascular;  Laterality: Left;  . Endarterectomy femoral Left 08/03/2015    Procedure: ENDARTERECTOMY LEFT ILIO-FEMORAL PROFUNDA;  Surgeon: Fransisco Hertz, MD;  Location: [facility] OR;  Service: Vascular;  Laterality: Left;  . Patch angioplasty Left 08/03/2015    Procedure: PATCH ANGIOPLASTY RIGHT ILIO-FEMORAL PROFUNDA USING XENOSURE BIOLOGIC PATCH;  Surgeon: Fransisco Hertz, MD;  Location: [facility] OR;  Service: Vascular;  Laterality: Left;  . Amputation toe Left 08/03/2015    Procedure: AMPUTATION LEFT FIFTH  TOE;  Surgeon: Fransisco Hertz, MD;  Location: [facility] OR;  Service: Vascular;  Laterality: Left;   Family History  Problem Relation Age of Onset  . Heart disease Mother   . Diabetes Mother   . Stroke Mother   . Cancer Father   . Heart disease Father   . Diabetes Father   . Heart disease Brother   . Diabetes Paternal Aunt   . Stroke Maternal Grandmother   . Diabetes Paternal Grandfather     Social History  Substance Use Topics  . Smoking status: Former Smoker -- 0.50 packs/day for 10 years    Types: Cigarettes    Start date: 12/26/1992    Quit date: 04/15/2000  . Smokeless tobacco: Never Used     Comment: QUIT SMOKING YEARS AGO "  . Alcohol Use: 0.0 oz/week    0 Standard drinks or equivalent per week     Comment: 07/27/2015 "might have a couple drinks twice/month"   OB History    No data available     Review of Systems  Constitutional:       Per HPI, otherwise negative  HENT:       Per HPI, otherwise negative  Respiratory:       Per HPI, otherwise negative  Cardiovascular:       Per HPI, otherwise negative  Gastrointestinal: Negative for vomiting.  Endocrine:       Negative aside from HPI  Genitourinary:       Neg aside from HPI   Musculoskeletal:       Per HPI, otherwise negative  Skin: Positive for color change and wound.  Neurological: Negative for syncope.      Allergies  Review of patient's allergies indicates no known allergies.  Home Medications   Prior to Admission medications   Medication Sig Start Date End Date Taking? Authorizing Provider  ALPRAZolam Prudy Feeler) 1 MG tablet Take 1 mg by mouth 4 (four) times daily as needed for anxiety.  05/06/13   Historical Provider, MD  aspirin EC 81 MG tablet Take 81 mg by mouth daily.    Historical Provider, MD  atorvastatin (LIPITOR) 80 MG tablet Take 1 tablet (80 mg total) by mouth daily at 6 PM. 06/18/13   Wilmon Pali, PA-C  carvedilol (COREG) 3.125 MG tablet Take 1 tablet (3.125 mg total) by mouth 2 (two) times daily. 11/30/13   Laqueta Linden, MD  cefpodoxime (VANTIN) 100 MG tablet Take 1 tablet (100 mg total) by mouth 2 (two) times daily. 07/29/15   Penny Pia, MD  collagenase (SANTYL) ointment Apply topically daily. 08/07/15   Samantha J Rhyne, PA-C  HUMALOG KWIKPEN 100 UNIT/ML KiwkPen Inject 10 Units into the skin daily as needed. Per sliding scale. For high sugar 10/22/13   Historical  Provider, MD  HYDROcodone-acetaminophen (NORCO) 10-325 MG tablet Take 1 tablet by mouth every 6 (six) hours as needed for moderate pain.    Historical Provider, MD  Insulin Detemir (LEVEMIR) 100 UNIT/ML Pen Inject 100 Units into the skin See admin instructions. Take 40 units in the morning, then take 20 units in the afternoon, then take 40 units again to equal out to 100units a day per patient 06/18/13   Wilmon Pali, PA-C  Insulin Pen Needle 29G X MISC 1 box 06/18/13   Wilmon Pali, PA-C  lisinopril (PRINIVIL,ZESTRIL) 5 MG tablet Take 1 tablet (5 mg total) by mouth daily. 06/18/13   Wilmon Pali, PA-C  metroNIDAZOLE (FLAGYL) 500 MG  tablet Take 1 tablet (500 mg total) by mouth every 8 (eight) hours. 07/29/15   Penny Pia, MD  omeprazole (PRILOSEC) 20 MG capsule Take 1 capsule by mouth 2 (two) times daily. 11/17/13   Historical Provider, MD  oxyCODONE-acetaminophen (PERCOCET/ROXICET) 5-325 MG tablet Take 1-2 tablets by mouth every 6 (six) hours as needed for moderate pain. 08/07/15   Samantha J Rhyne, PA-C  polyethylene glycol (MIRALAX / GLYCOLAX) packet Take 17 g by mouth daily as needed for mild constipation.  06/18/13   Gina L Collins, PA-C  PROAIR HFA 108 (90 BASE) MCG/ACT inhaler Inhale 2 puffs into the lungs 4 (four) times daily as needed for shortness of breath.  01/09/15   Historical Provider, MD  traMADol (ULTRAM) 50 MG tablet Take 1-2 tablets (50-100 mg total) by mouth every 4 (four) hours as needed for moderate pain. 06/18/13   Gina L Collins, PA-C   BP 125/73 mmHg  Pulse 98  Temp(Src) 99.1 F (37.3 C) (Oral)  Resp 18  Ht 5\' 1"  (1.549 m)  Wt 180 lb (81.647 kg)  BMI 34.03 kg/m2  SpO2 100% Physical Exam  Constitutional: She is oriented to person, place, and time. She appears well-developed and well-nourished. No distress.  HENT:  Head: Normocephalic and atraumatic.  Eyes: Conjunctivae and EOM are normal.  Cardiovascular: Normal rate and regular rhythm.   Pulmonary/Chest: Effort normal  and breath sounds normal. No stridor. No respiratory distress.  Abdominal: She exhibits no distension.  Musculoskeletal: She exhibits no edema.       Legs: Neurological: She is alert and oriented to person, place, and time. No cranial nerve deficit.  Skin: Skin is warm and dry.     Psychiatric: She has a normal mood and affect.  Nursing note and vitals reviewed.   ED Course  Procedures (including critical care time) Labs Review Labs Reviewed  COMPREHENSIVE METABOLIC PANEL - Abnormal; Notable for the following:    Glucose, Bld 344 (*)    BUN 26 (*)    Total Protein 6.4 (*)    Albumin 2.6 (*)    AST 14 (*)    All other components within normal limits  BRAIN NATRIURETIC PEPTIDE - Abnormal; Notable for the following:    B Natriuretic Peptide 394.0 (*)    All other components within normal limits  CBC WITH DIFFERENTIAL/PLATELET - Abnormal; Notable for the following:    WBC 10.6 (*)    RBC 2.69 (*)    Hemoglobin 8.4 (*)    HCT 25.9 (*)    Neutro Abs 8.0 (*)    All other components within normal limits    Imaging Review US Venous Img Lower Unilateral Left  08/11/2015  CLINICAL DATA:  Left hip and lower extremity pain. Left thigh edema. EXAM: LEFT LOWER EXTREMITY VENOUS DOPPLER ULTRASOUND TECHNIQUE: Gray-scale sonography with graded compression, as well as color Doppler and duplex ultrasound were performed to evaluate the lower extremity deep venous systems from the level of the common femoral vein and including the common femoral, femoral, profunda femoral, popliteal and calf veins including the posterior tibial, peroneal and gastrocnemius veins when visible. The superficial great saphenous vein was also interrogated. Spectral Doppler was utilized to evaluate flow at rest and with distal augmentation maneuvers in the common femoral, femoral and popliteal veins. COMPARISON:  None. FINDINGS: Contralateral Common Femoral Vein: Respiratory phasicity is normal and symmetric with the  symptomatic side. No evidence of thrombus. Normal compressibility. Common Femoral Vein: No evidence of thrombus. Normal compressibility, respiratory phasicity  and response to augmentation. Saphenofemoral Junction: No evidence of thrombus. Normal compressibility and flow on color Doppler imaging. Profunda Femoral Vein: No evidence of thrombus. Normal compressibility and flow on color Doppler imaging. Femoral Vein: No evidence of thrombus. Normal compressibility, respiratory phasicity and response to augmentation. Popliteal Vein: No evidence of thrombus. Normal compressibility, respiratory phasicity and response to augmentation. Calf Veins: Not visualized due to body habitus and subcutaneous edema. Superficial Great Saphenous Vein: No evidence of thrombus. Normal compressibility and flow on color Doppler imaging. Venous Reflux:  None. Other Findings:  Soft tissue edema is. IMPRESSION: No evidence of left lower extremity deep venous thrombosis. Left calf veins not visualized due to soft tissue edema. Electronically Signed   By: Rubye Oaks M.D.   On: 08/11/2015 18:03   Dg Hip Unilat With Pelvis 2-3 Views Left  08/11/2015  CLINICAL DATA:  Left hip pain. Pain for 1 week after bypass surgery on left leg and toe amputation. EXAM: DG HIP (WITH OR WITHOUT PELVIS) 2-3V LEFT COMPARISON:  None. FINDINGS: The cortical margins of the bony pelvis and left hip are intact. No fracture. There is degenerative change at the pubic symphysis. Both femoral heads are well-seated in the respective acetabula. Surgical clips at the left groin. Vascular calcifications are seen. Questionable focus of air in the soft tissues projecting over the proximal femur. IMPRESSION: 1. No acute bony abnormality of the pelvis or left leg. Pubic symphyseal degenerative change. 2. Questionable air projecting over the soft tissues of the proximal thigh. This may be related to recent procedure. Recommend correlation for any signs or symptoms of soft  tissue infection. Electronically Signed   By: Rubye Oaks M.D.   On: 08/11/2015 18:14   I have personally reviewed and evaluated these images and lab results as part of my medical decision-making.  Chart review notable for recent surgical procedure, as below Hospital Course:   The patient was admitted to the hospital and taken to the operating room on 08/03/2015 and underwent: 1.  Left iliofemoral endarterectomy with bovine patch angioplasty 2.  Extended left profundoplasty   3.  Left common femoral artery to below-the-knee popliteal artery bypass with Propaten 4.  Left fifth ray amputation    7:09 PM Patient in no distress, aware of all results, states that she feels better. She will have a short course of Lasix given her history of congestive heart failure, elevated BNP.   MDM  History presents one week after initial presentation for gangrenous toe, and subsequent left lower extremity bypass procedures, now with concern for swelling, pain. Here, no evidence for surgical site infection, distress, extremely or sepsis. Patient improved with analgesia, and initiation of Lasix. Patient discharged in stable condition with surgical follow-up.  Gerhard Munch, MD 08/11/15 1911

## 2015-08-11 NOTE — ED Notes (Signed)
Pt reports had bypass surgery on left leg and a toe amputated last Thursday.  Pt reports swelling to both legs, left hip, and back.  Pt says nothing is helping her pain.

## 2015-08-11 NOTE — Telephone Encounter (Signed)
Rec'd phone call from Karlyn Agee, nurse with [facility].  Reported she saw the pt. This afternoon, and noted that she was in a lot of discomfort.  Reported that the the pt. has a "fist-sized" raised area on left buttock that is very tender to touch.  Denied any redness or any bruised discoloration of the area.  Phone call to pt. Spoke with her husband.  Stated the pt. went to [facility]. about 1.5 hrs. ago, due to the pain she was experiencing.

## 2015-08-11 NOTE — ED Notes (Signed)
Pt still in x-ray

## 2015-08-11 NOTE — Telephone Encounter (Signed)
Phone call from pt.  C/o muscle aching and pain in left lower back and left hip, when in certain position.  Stated she feels a "twinge" in the  back and can't get comfortable.  Reported she uses a lift chair, and has difficulty laying back and relaxing.   Reported increased swelling in the left leg, because she can't get comfortable in a position to get the leg high enough.  Reported the leg swelling does improve a little bit overnight.  Has been using a heating pad on her back; stated it has helped some.  Denied fever/ chills.  Is requesting a muscle relaxant. Discussed with Dr. Imogene Burn.  Advised against any use of muscle relaxant, while taking a narcotic.  Notified pt. Of Dr. Nicky Pugh recommendation.  Encouraged to use heat alternating with ice to the left lower back.  Encouraged pt. To take Ibuprofen OTC per package directions for the muscle discomfort.  Encouraged to walk intermittently, alternating with laying down and elevating legs high than heart.  Pt. Verb. Understanding.

## 2015-08-11 NOTE — Discharge Instructions (Signed)
As discussed, today's evaluation is largely reassuring.  However, it is important that you monitor your condition carefully, given your recent surgery.  Next week please use Lasix, 20 mg daily.  Please discuss this with your physician.  Return here for concerning changes in your condition.

## 2015-08-15 ENCOUNTER — Other Ambulatory Visit (HOSPITAL_COMMUNITY): Payer: Self-pay | Admitting: Family

## 2015-08-16 ENCOUNTER — Encounter (HOSPITAL_COMMUNITY): Payer: Self-pay | Admitting: *Deleted

## 2015-08-16 NOTE — Progress Notes (Signed)
                                      Expand All Collapse All   Pt has CAD (CABG done), she denies any recent chest pain or sob. Pt is diabetic. Last A1C was 9.3 on 07/26/15. She states her fasting blood sugar has been running around 158 recently.  Pt was instructed  to take 1/2 of her regular dose of Levemir Insulin Thursday PM and half of her regular dose of Levimir Friday AM. I instructed pt to check her blood sugar in the AM (every 2 hours until she leaves for the hospital). I instructed pt if blood sugar is > 220 to use 1/2 regular sliding scale dose of Humalog insulin and if her blood sugar is 70 or less to treat with a 1/2 cup (4 oz) of clear juice (apple or cranberry). If she does have to drink the juice, instructed her to recheck blood sugar 15 minutes after drinking the juice. Gave her the number to Short Stay in case blood sugar does not come up over 70 and instructed her to call and speak with a nurse. She voiced understanding. These instructions given per Diabetes Medication Adjustment Guidelines Prior to Procedure and Surgery.  Cath - 06/07/13 -in EPIC Echo - 03/02/14 - in EPIC EKG - 07/29/15 - in [facility]

## 2015-08-17 NOTE — Anesthesia Preprocedure Evaluation (Addendum)
Anesthesia Evaluation  Patient identified by MRN, date of birth, ID band Patient awake    Reviewed: Allergy & Precautions, H&P , NPO status , Patient's Chart, lab work & pertinent test results  Airway Mallampati: III  TM Distance: >3 FB Neck ROM: Full    Dental no notable dental hx. (+) Teeth Intact, Dental Advisory Given   Pulmonary neg pulmonary ROS, former smoker,    breath sounds clear to auscultation       Cardiovascular + CAD, + Cardiac Stents, + CABG, + Peripheral Vascular Disease and +CHF   Rhythm:Regular Rate:Normal     Neuro/Psych Anxiety negative neurological ROS  negative psych ROS   GI/Hepatic Neg liver ROS, GERD  ,  Endo/Other  diabetes, Type 1, Insulin DependentMorbid obesity  Renal/GU negative Renal ROS  negative genitourinary   Musculoskeletal   Abdominal   Peds  Hematology negative hematology ROS (+) anemia ,   Anesthesia Other Findings   Reproductive/Obstetrics negative OB ROS                         Anesthesia Physical Anesthesia Plan  ASA: III  Anesthesia Plan: General   Post-op Pain Management:    Induction: Intravenous  Airway Management Planned: LMA  Additional Equipment:   Intra-op Plan:   Post-operative Plan: Extubation in OR  Informed Consent: I have reviewed the patients History and Physical, chart, labs and discussed the procedure including the risks, benefits and alternatives for the proposed anesthesia with the patient or authorized representative who has indicated his/her understanding and acceptance.   Dental advisory given  Plan Discussed with: CRNA  Anesthesia Plan Comments:         Anesthesia Quick Evaluation                                  Anesthesia Evaluation  Patient identified by MRN, date of birth, ID band Patient awake    Reviewed: Allergy & Precautions, H&P , NPO status , Patient's Chart, lab work & pertinent test  results, reviewed documented beta blocker date and time   Airway Mallampati: III  TM Distance: >3 FB Neck ROM: Full    Dental no notable dental hx. (+) Teeth Intact, Dental Advisory Given   Pulmonary neg pulmonary ROS, former smoker,    Pulmonary exam normal breath sounds clear to auscultation       Cardiovascular + CAD, + Cardiac Stents, + CABG, + Peripheral Vascular Disease and +CHF   Rhythm:Regular Rate:Normal     Neuro/Psych Anxiety negative neurological ROS  negative psych ROS   GI/Hepatic Neg liver ROS, GERD  Medicated and Controlled,  Endo/Other  diabetes, Type 1, Insulin Dependent  Renal/GU Renal disease  negative genitourinary   Musculoskeletal   Abdominal   Peds  Hematology negative hematology ROS (+) anemia ,   Anesthesia Other Findings   Reproductive/Obstetrics negative OB ROS                             Anesthesia Physical Anesthesia Plan  ASA: III  Anesthesia Plan: General   Post-op Pain Management:    Induction: Intravenous  Airway Management Planned: Oral ETT  Additional Equipment:   Intra-op Plan:   Post-operative Plan: Extubation in OR  Informed Consent: I have reviewed the patients History and Physical, chart, labs and discussed the procedure including the risks, benefits and alternatives  for the proposed anesthesia with the patient or authorized representative who has indicated his/her understanding and acceptance.   Dental advisory given  Plan Discussed with: CRNA  Anesthesia Plan Comments:         Anesthesia Quick Evaluation

## 2015-08-18 ENCOUNTER — Encounter: Payer: Self-pay | Admitting: Vascular Surgery

## 2015-08-18 ENCOUNTER — Encounter (HOSPITAL_COMMUNITY): Payer: Self-pay | Admitting: Surgery

## 2015-08-18 ENCOUNTER — Ambulatory Visit (HOSPITAL_COMMUNITY): Payer: BLUE CROSS/BLUE SHIELD | Admitting: Anesthesiology

## 2015-08-18 ENCOUNTER — Encounter (HOSPITAL_COMMUNITY)
Admission: RE | Disposition: A | Discharge status: Home / Self Care | Payer: Self-pay | Source: Ambulatory Visit | Attending: Orthopedic Surgery

## 2015-08-18 ENCOUNTER — Ambulatory Visit (HOSPITAL_COMMUNITY)
Admission: RE | Admit: 2015-08-18 | Discharge: 2015-08-18 | Disposition: A | Discharge status: Home / Self Care | Payer: BLUE CROSS/BLUE SHIELD | Source: Ambulatory Visit | Attending: Orthopedic Surgery | Admitting: Orthopedic Surgery

## 2015-08-18 DIAGNOSIS — I2510 Atherosclerotic heart disease of native coronary artery without angina pectoris: Secondary | ICD-10-CM | POA: Diagnosis not present

## 2015-08-18 DIAGNOSIS — Z79899 Other long term (current) drug therapy: Secondary | ICD-10-CM | POA: Diagnosis not present

## 2015-08-18 DIAGNOSIS — Z794 Long term (current) use of insulin: Secondary | ICD-10-CM | POA: Insufficient documentation

## 2015-08-18 DIAGNOSIS — M868X7 Other osteomyelitis, ankle and foot: Secondary | ICD-10-CM | POA: Diagnosis present

## 2015-08-18 DIAGNOSIS — Z6834 Body mass index (BMI) 34.0-34.9, adult: Secondary | ICD-10-CM | POA: Diagnosis not present

## 2015-08-18 DIAGNOSIS — Z87891 Personal history of nicotine dependence: Secondary | ICD-10-CM | POA: Insufficient documentation

## 2015-08-18 DIAGNOSIS — I96 Gangrene, not elsewhere classified: Secondary | ICD-10-CM

## 2015-08-18 DIAGNOSIS — E1151 Type 2 diabetes mellitus with diabetic peripheral angiopathy without gangrene: Secondary | ICD-10-CM | POA: Insufficient documentation

## 2015-08-18 DIAGNOSIS — Z955 Presence of coronary angioplasty implant and graft: Secondary | ICD-10-CM | POA: Diagnosis not present

## 2015-08-18 HISTORY — PX: AMPUTATION: SHX166

## 2015-08-18 LAB — GLUCOSE, CAPILLARY
Glucose-Capillary: 103 mg/dL — ABNORMAL HIGH (ref 65–99)
Glucose-Capillary: 123 mg/dL — ABNORMAL HIGH (ref 65–99)

## 2015-08-18 LAB — BASIC METABOLIC PANEL
Anion gap: 10 (ref 5–15)
BUN: 17 mg/dL (ref 6–20)
CHLORIDE: 108 mmol/L (ref 101–111)
CO2: 23 mmol/L (ref 22–32)
CREATININE: 0.77 mg/dL (ref 0.44–1.00)
Calcium: 8.6 mg/dL — ABNORMAL LOW (ref 8.9–10.3)
GFR calc Af Amer: >60 mL/min (ref >60)
GFR calc non Af Amer: >60 mL/min (ref >60)
Glucose, Bld: 112 mg/dL — ABNORMAL HIGH (ref 65–99)
POTASSIUM: 4.4 mmol/L (ref 3.5–5.1)
SODIUM: 141 mmol/L (ref 135–145)

## 2015-08-18 LAB — PREPARE RBC (CROSSMATCH)

## 2015-08-18 LAB — CBC
HEMATOCRIT: 26.3 % — AB (ref 36.0–46.0)
Hemoglobin: 8 g/dL — ABNORMAL LOW (ref 12.0–15.0)
MCH: 29.1 pg (ref 26.0–34.0)
MCHC: 30.4 g/dL (ref 30.0–36.0)
MCV: 95.6 fL (ref 78.0–100.0)
PLATELETS: 345 10*3/uL (ref 150–400)
RBC: 2.75 MIL/uL — ABNORMAL LOW (ref 3.87–5.11)
RDW: 14.2 % (ref 11.5–15.5)
WBC: 9.7 10*3/uL (ref 4.0–10.5)

## 2015-08-18 SURGERY — AMPUTATION, FOOT, RAY
Anesthesia: General | Site: Foot | Laterality: Left

## 2015-08-18 MED ORDER — CEFAZOLIN SODIUM-DEXTROSE 2-4 GM/100ML-% IV SOLN
INTRAVENOUS | Status: AC
  Filled 2015-08-18: qty 100

## 2015-08-18 MED ORDER — 0.9 % SODIUM CHLORIDE (POUR BTL) OPTIME
TOPICAL | Status: DC | PRN
Start: 2015-08-18 — End: 2015-08-18
  Administered 2015-08-18: 1000 mL

## 2015-08-18 MED ORDER — HYDROMORPHONE HCL 1 MG/ML IJ SOLN: 0.2500 mg | INTRAMUSCULAR | Status: DC | PRN

## 2015-08-18 MED ORDER — OXYCODONE-ACETAMINOPHEN 5-325 MG PO TABS: 1.0000 | ORAL_TABLET | ORAL | Status: DC | PRN

## 2015-08-18 MED ORDER — LIDOCAINE HCL (CARDIAC) 20 MG/ML IV SOLN
INTRAVENOUS | Status: DC | PRN
  Administered 2015-08-18: 60 mg via INTRAVENOUS

## 2015-08-18 MED ORDER — MIDAZOLAM HCL 5 MG/5ML IJ SOLN
INTRAMUSCULAR | Status: DC | PRN
  Administered 2015-08-18: 2 mg via INTRAVENOUS

## 2015-08-18 MED ORDER — LACTATED RINGERS IV SOLN
INTRAVENOUS | Status: DC
  Administered 2015-08-18: 10:00:00 via INTRAVENOUS

## 2015-08-18 MED ORDER — CEFAZOLIN SODIUM-DEXTROSE 2-4 GM/100ML-% IV SOLN
2.0000 g | INTRAVENOUS | Status: AC
  Administered 2015-08-18: 2 g via INTRAVENOUS

## 2015-08-18 MED ORDER — PROPOFOL 10 MG/ML IV BOLUS
INTRAVENOUS | Status: AC
  Filled 2015-08-18: qty 20

## 2015-08-18 MED ORDER — FENTANYL CITRATE (PF) 100 MCG/2ML IJ SOLN
INTRAMUSCULAR | Status: DC | PRN
  Administered 2015-08-18: 50 ug via INTRAVENOUS

## 2015-08-18 MED ORDER — CHLORHEXIDINE GLUCONATE 4 % EX LIQD: 60.0000 mL | Freq: Once | CUTANEOUS | Status: DC

## 2015-08-18 MED ORDER — MIDAZOLAM HCL 2 MG/2ML IJ SOLN
INTRAMUSCULAR | Status: AC
  Filled 2015-08-18: qty 2

## 2015-08-18 MED ORDER — PROPOFOL 10 MG/ML IV BOLUS
INTRAVENOUS | Status: DC | PRN
  Administered 2015-08-18: 150 mg via INTRAVENOUS

## 2015-08-18 MED ORDER — PHENYLEPHRINE HCL 10 MG/ML IJ SOLN
INTRAMUSCULAR | Status: DC | PRN
  Administered 2015-08-18 (×2): 80 ug via INTRAVENOUS

## 2015-08-18 MED ORDER — ONDANSETRON HCL 4 MG/2ML IJ SOLN
INTRAMUSCULAR | Status: DC | PRN
  Administered 2015-08-18: 4 mg via INTRAVENOUS

## 2015-08-18 MED ORDER — FENTANYL CITRATE (PF) 250 MCG/5ML IJ SOLN
INTRAMUSCULAR | Status: AC
  Filled 2015-08-18: qty 5

## 2015-08-18 SURGICAL SUPPLY — 32 items
BLADE SAW SGTL MED 73X18.5 STR (BLADE) IMPLANT
BNDG COHESIVE 4X5 TAN STRL (GAUZE/BANDAGES/DRESSINGS) ×1 IMPLANT
BNDG GAUZE ELAST 4 BULKY (GAUZE/BANDAGES/DRESSINGS) ×1 IMPLANT
COVER SURGICAL LIGHT HANDLE (MISCELLANEOUS) ×4 IMPLANT
DRAPE INCISE IOBAN 66X45 STRL (DRAPES) ×2 IMPLANT
DRAPE U-SHAPE 47X51 STRL (DRAPES) ×4 IMPLANT
DRSG ADAPTIC 3X8 NADH LF (GAUZE/BANDAGES/DRESSINGS) ×1 IMPLANT
DRSG PAD ABDOMINAL 8X10 ST (GAUZE/BANDAGES/DRESSINGS) ×2 IMPLANT
DURAPREP 26ML APPLICATOR (WOUND CARE) ×3 IMPLANT
ELECT REM PT RETURN 9FT ADLT (ELECTROSURGICAL) ×3
ELECTRODE REM PT RTRN 9FT ADLT (ELECTROSURGICAL) ×1 IMPLANT
GAUZE SPONGE 4X4 12PLY STRL (GAUZE/BANDAGES/DRESSINGS) ×1 IMPLANT
GLOVE BIOGEL PI IND STRL 9 (GLOVE) ×1 IMPLANT
GLOVE BIOGEL PI INDICATOR 9 (GLOVE) ×2
GLOVE SURG ORTHO 9.0 STRL STRW (GLOVE) ×3 IMPLANT
GOWN STRL REUS W/ TWL LRG LVL3 (GOWN DISPOSABLE) ×1 IMPLANT
GOWN STRL REUS W/ TWL XL LVL3 (GOWN DISPOSABLE) ×2 IMPLANT
GOWN STRL REUS W/TWL LRG LVL3 (GOWN DISPOSABLE) ×3
GOWN STRL REUS W/TWL XL LVL3 (GOWN DISPOSABLE) ×6
KIT BASIN OR (CUSTOM PROCEDURE TRAY) ×3 IMPLANT
KIT PREVENA INCISION MGT 13 (CANNISTER) ×2 IMPLANT
KIT ROOM TURNOVER OR (KITS) ×3 IMPLANT
NS IRRIG 1000ML POUR BTL (IV SOLUTION) ×3 IMPLANT
PACK ORTHO EXTREMITY (CUSTOM PROCEDURE TRAY) ×3 IMPLANT
PAD ARMBOARD 7.5X6 YLW CONV (MISCELLANEOUS) ×6 IMPLANT
SPONGE LAP 18X18 X RAY DECT (DISPOSABLE) ×3 IMPLANT
STOCKINETTE IMPERVIOUS LG (DRAPES) IMPLANT
SUT ETHILON 2 0 PSLX (SUTURE) ×6 IMPLANT
TOWEL OR 17X24 6PK STRL BLUE (TOWEL DISPOSABLE) ×3 IMPLANT
TOWEL OR 17X26 10 PK STRL BLUE (TOWEL DISPOSABLE) ×3 IMPLANT
UNDERPAD 30X30 INCONTINENT (UNDERPADS AND DIAPERS) ×3 IMPLANT
WATER STERILE IRR 1000ML POUR (IV SOLUTION) ×1 IMPLANT

## 2015-08-18 NOTE — Anesthesia Procedure Notes (Signed)
Procedure Name: LMA Insertion Date/Time: 08/18/2015 12:00 PM Performed by: Arlice Colt B Pre-anesthesia Checklist: Patient identified, Emergency Drugs available, Suction available, Patient being monitored and Timeout performed Patient Re-evaluated:Patient Re-evaluated prior to inductionOxygen Delivery Method: Circle system utilized Preoxygenation: Pre-oxygenation with 100% oxygen Intubation Type: IV induction LMA: LMA inserted LMA Size: 4.0 Number of attempts: 1 Placement Confirmation: positive ETCO2 and breath sounds checked- equal and bilateral Tube secured with: Tape Dental Injury: Teeth and Oropharynx as per pre-operative assessment

## 2015-08-18 NOTE — Progress Notes (Signed)
Orthopedic Tech Progress Note Patient Details:  Isabel [name] 19-Jul-1965 [id number]  Ortho Devices Type of Ortho Device: Postop shoe/boot Ortho Device/Splint Location: lle Ortho Device/Splint Interventions: Application Viewed order from doctor's order list  Nikki Dom 08/18/2015, 12:57 PM

## 2015-08-18 NOTE — Transfer of Care (Signed)
Immediate Anesthesia Transfer of Care Note  Patient: Isabel [name]  Procedure(s) Performed: Procedure(s): Left Foot 4th and 5th Ray Amputation With [facility] Placement (Left)  Patient Location: PACU  Anesthesia Type:General  Level of Consciousness: awake, alert  and oriented  Airway & Oxygen Therapy: Patient Spontanous Breathing  Post-op Assessment: Report given to RN and Post -op Vital signs reviewed and stable  Post vital signs: Reviewed and stable  Last Vitals:  Filed Vitals:   08/18/15 0956  BP: 138/60  Pulse: 91  Temp: 37 C  Resp: 18    Last Pain:  Filed Vitals:   08/18/15 1006  PainSc: 4       Patients Stated Pain Goal: 5 (08/18/15 1005)  Complications: No apparent anesthesia complications

## 2015-08-18 NOTE — Anesthesia Postprocedure Evaluation (Signed)
Anesthesia Post Note  Patient: Isabel [name]  Procedure(s) Performed: Procedure(s) (LRB): Left Foot 4th and 5th Ray Amputation With [facility] Placement (Left)  Patient location during evaluation: PACU Anesthesia Type: General Level of consciousness: awake and alert Pain management: pain level controlled Vital Signs Assessment: post-procedure vital signs reviewed and stable Respiratory status: spontaneous breathing, nonlabored ventilation and respiratory function stable Cardiovascular status: blood pressure returned to baseline and stable Postop Assessment: no signs of nausea or vomiting Anesthetic complications: no    Last Vitals:  Filed Vitals:   08/18/15 1330 08/18/15 1331  BP: 121/62   Pulse: 86 88  Temp:    Resp: 18 19    Last Pain:  Filed Vitals:   08/18/15 1340  PainSc: 4                  Aalyiah Camberos,W. EDMOND

## 2015-08-18 NOTE — H&P (Signed)
LESTINE WORTMANN is an 50 y.o. female.   Chief Complaint: Osteomyelitis and ulceration ulceration left foot  fifth metatarsal head HPI: Patient is a 50 year old woman status post revascularization for the left lower extremity. Patient had amputation of the fifth toe this has slowly dehisced and patient presents at this time for revision amputation of the fifth ray with amputation of the fourth ray.  Past Medical History  Diagnosis Date  . Coronary artery disease   . Renal disorder     kidney stones  . CHF (congestive heart failure) (HCC)   . Peripheral vascular disease (HCC)   . Diabetes mellitus without complication (HCC)     Type 2  . Anxiety   . GERD (gastroesophageal reflux disease)     Past Surgical History  Procedure Laterality Date  . Coronary angioplasty with stent placement    . Back surgery    . Kidney stone surgery    . Salpingoophorectomy      "? side"  . Tonsillectomy    . Coronary artery bypass graft N/A 06/08/2013    Procedure: CORONARY ARTERY BYPASS GRAFTING (CABG) times four using left internal mammary and right saphenous vein.;  Surgeon: Delight Ovens, MD;  Location: [facility] OR;  Service: Open Heart Surgery;  Laterality: N/A;  . Intraoperative transesophageal echocardiogram N/A 06/08/2013    Procedure: INTRAOPERATIVE TRANSESOPHAGEAL ECHOCARDIOGRAM;  Surgeon: Delight Ovens, MD;  Location: [facility] OR;  Service: Open Heart Surgery;  Laterality: N/A;  . Left and right heart catheterization with coronary angiogram N/A 06/07/2013    Procedure: LEFT AND RIGHT HEART CATHETERIZATION WITH CORONARY ANGIOGRAM;  Surgeon: Marykay Lex, MD;  Location: [facility] CATH LAB;  Service: Cardiovascular;  Laterality: N/A;  . Peripheral vascular catheterization N/A 07/27/2015    Procedure: Abdominal Aortogram;  Surgeon: Chuck Hint, MD;  Location: [facility] INVASIVE CV LAB;  Service: Cardiovascular;  Laterality: N/A;  . Lower extremity angiogram Bilateral 07/27/2015    Procedure: Lower Extremity  Angiogram;  Surgeon: Chuck Hint, MD;  Location: [facility] INVASIVE CV LAB;  Service: Cardiovascular;  Laterality: Bilateral;  . Tubal ligation    . Femoral-popliteal bypass graft Left 08/03/2015    Procedure: BYPASS GRAFT  LEFT FEMORAL TO  BELOW KNEE POPLITEAL ARTERY USING X 80CM GORE PROPATEN GRAFT;  Surgeon: Fransisco Hertz, MD;  Location: [facility] OR;  Service: Vascular;  Laterality: Left;  . Endarterectomy femoral Left 08/03/2015    Procedure: ENDARTERECTOMY LEFT ILIO-FEMORAL PROFUNDA;  Surgeon: Fransisco Hertz, MD;  Location: [facility] OR;  Service: Vascular;  Laterality: Left;  . Patch angioplasty Left 08/03/2015    Procedure: PATCH ANGIOPLASTY RIGHT ILIO-FEMORAL PROFUNDA USING XENOSURE BIOLOGIC PATCH;  Surgeon: Fransisco Hertz, MD;  Location: [facility] OR;  Service: Vascular;  Laterality: Left;  . Amputation toe Left 08/03/2015    Procedure: AMPUTATION LEFT FIFTH TOE;  Surgeon: Fransisco Hertz, MD;  Location: [facility] OR;  Service: Vascular;  Laterality: Left;    Family History  Problem Relation Age of Onset  . Heart disease Mother   . Diabetes Mother   . Stroke Mother   . Cancer Father   . Heart disease Father   . Diabetes Father   . Heart disease Brother   . Diabetes Paternal Aunt   . Stroke Maternal Grandmother   . Diabetes Paternal Grandfather    Social History:  reports that she quit smoking about 15 years ago. Her smoking use included Cigarettes. She started smoking about 22 years ago. She has a 5  pack-year smoking history. She has never used smokeless tobacco. She reports that she drinks alcohol. She reports that she does not use illicit drugs.  Allergies: No Known Allergies  No prescriptions prior to admission    No results found for this or any previous visit (from the past 48 hour(s)). No results found.  ROS  There were no vitals taken for this visit. Physical Exam  On examination patient's foot is warm her revascularization is patent. She has an ulcer over the fifth metatarsal head dorsally  with exposed bone and necrotic tissue no abscess no purulence. 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  Plan: We'll plan for fourth and fifth Ray amputation risks and benefits were discussed including risk of the wound not healing. Patient states she understands wishes to proceed at this time.  Nadara Mustard, MD 08/18/2015, 6:11 AM

## 2015-08-18 NOTE — Op Note (Signed)
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 Local tissue rearrangement for wound closure 3 x 7 cm  SURGEON:  Reva Pinkley V, MD  PHYSICIAN ASSISTANT:None ANESTHESIA:   General  PREOPERATIVE INDICATIONS:  Isabel [name] is a  50 y.o. female with a diagnosis of Osteomyelitis 5th Metatarsal Head Left Foot who failed conservative measures and elected for surgical management.    The risks benefits and alternatives were discussed with the patient preoperatively including but not limited to the risks of infection, bleeding, nerve injury, cardiopulmonary complications, the need for revision surgery, among others, and the patient was willing to proceed.  OPERATIVE IMPLANTS: Prevena wound VAC  OPERATIVE FINDINGS: Necrotic tissue distally  OPERATIVE PROCEDURE: Patient brought the operating room and underwent a general anesthetic. After adequate levels anesthesia obtained patient's left lower extremity was prepped using DuraPrep draped into a sterile field. A timeout was called. A racquet incision was made around the fourth and fifth rays. This was to encompass the entire ulcer. The ulcerative tissue in the fourth and fifth rays were resected in 1 block of tissue. Metatarsals were transected proximally. Further dissection was performed to excise necrotic tissue. Electrocautery was used for hemostasis. Local tissue rearrangement was used for wound closure 3 x 7 cm. A Prevena wound VAC was applied patient was extubated taken the PACU in stable condition plan for discharge to home.

## 2015-08-21 ENCOUNTER — Encounter (HOSPITAL_COMMUNITY): Payer: Self-pay | Admitting: Orthopedic Surgery

## 2015-08-21 LAB — TYPE AND SCREEN
ABO/RH(D): A NEG
Antibody Screen: NEGATIVE
UNIT DIVISION: 0
Unit division: 0

## 2015-08-24 NOTE — Progress Notes (Signed)
    Postoperative Visit   History of Present Illness  Isabel [name] is a 49 y.o. year old female who presents for postoperative follow-up for:   PROCEDURE: 1. Left iliofemoral endarterectomy with bovine patch angioplasty 2. Extended left profundoplasty  3. Left common femoral artery to below-the-knee popliteal artery bypass with Propaten 4. Left fifth ray amputation (Date: 08/03/15).    She also underwent L 4th and 5th ray amputation on 08/18/15 with Dr. Lajoyce Corners.  The patient's leg wound are intact.  The left groin has opened partially.  The patient notes improvement in lower extremity symptoms.  The patient is able to complete her activities of daily living.  The patient's current symptoms are: drainage from left foot, continue post-op pain and drainage from left groin.  No fever or chills.  For VQI Use Only  PRE-ADM LIVING: Home  AMB STATUS: Ambulatory with Assistance   Physical Examination  Filed Vitals:   08/25/15 1007 08/25/15 1009  BP: 167/88 165/88  Pulse: 93   Temp: 97.5 F (36.4 C)    Body mass index is 34.03 kg/(m^2).   LLE: L calf incision c/d/i, L groin: superficial separation with necrotic fat, no pus or drainage noted, L foot: sutures in place, few ischemic patches of skin, serosang drainage, skin otherwise looks viable   Medical Decision Making  Isabel [name] is a 50 y.o. year old female who presents s/p L iliofemoral EA w/ BPA, extended L PFA, L CFA to BK pop w/ Propaten, s/p L 4th and 5th ray amp  Unfortunately, I'm not too surprised with her wound complication, given her obesity. I offered her: L groin debridement with VAC placement in the OR.  She declined. She elected outpatient wound care with:  Santyl ointment to L groin daily.  Wet-to-dry dressing on top daily. We discussed that going to BID frequency would be better if she can get a family member to do the dressing changes. She will follow up in one week to see if the wound is starting to clean  up. Thank you for allowing [place] to participate in this patient's care.  Leonides Sake, MD [facility] [place] Office: [phone] Pager: [phone]  08/24/2015, 9:39 AM

## 2015-08-25 ENCOUNTER — Encounter: Payer: Self-pay | Admitting: Vascular Surgery

## 2015-08-25 ENCOUNTER — Ambulatory Visit (INDEPENDENT_AMBULATORY_CARE_PROVIDER_SITE_OTHER): Payer: Self-pay | Admitting: Vascular Surgery

## 2015-08-25 VITALS — BP 165/88 | HR 93 | Temp 97.5°F | Ht 61.0 in | Wt 180.0 lb

## 2015-08-25 DIAGNOSIS — I70269 Atherosclerosis of native arteries of extremities with gangrene, unspecified extremity: Secondary | ICD-10-CM | POA: Insufficient documentation

## 2015-08-25 DIAGNOSIS — I70262 Atherosclerosis of native arteries of extremities with gangrene, left leg: Secondary | ICD-10-CM

## 2015-08-25 MED ORDER — COLLAGENASE 250 UNIT/GM EX OINT: 1.0000 "application " | TOPICAL_OINTMENT | Freq: Every day | CUTANEOUS | Status: DC

## 2015-09-01 ENCOUNTER — Encounter: Payer: Self-pay | Admitting: Vascular Surgery

## 2015-09-01 ENCOUNTER — Ambulatory Visit (INDEPENDENT_AMBULATORY_CARE_PROVIDER_SITE_OTHER): Payer: BLUE CROSS/BLUE SHIELD | Admitting: Vascular Surgery

## 2015-09-01 VITALS — BP 118/69 | HR 85 | Temp 99.0°F | Ht 61.0 in | Wt 180.0 lb

## 2015-09-01 DIAGNOSIS — I70262 Atherosclerosis of native arteries of extremities with gangrene, left leg: Secondary | ICD-10-CM

## 2015-09-01 MED ORDER — OXYCODONE HCL 10 MG PO TABS: 10.0000 mg | ORAL_TABLET | Freq: Three times a day (TID) | ORAL | Status: DC | PRN

## 2015-09-01 NOTE — Progress Notes (Signed)
    Postoperative Visit   History of Present Illness  Isabel [name] is a 50 y.o. (04/25/1965) female who presents for postoperative follow-up for:   PROCEDURE: 1. Left iliofemoral endarterectomy with bovine patch angioplasty 2. Extended left profundoplasty  3. Left common femoral artery to below-the-knee popliteal artery bypass with Propaten 4. Left fifth ray amputation (Date: 08/03/15).   She also underwent L 4th and 5th ray amputation on 08/18/15 with Dr. Lajoyce Corners. Pt has followed up with Dr. Lajoyce Corners since her repeat L foot amputation.  Pt has been doing Santyl ointment to L groin with wet-to-dry dressings.   For VQI Use Only  PRE-ADM LIVING: Home  AMB STATUS: Ambulatory with Assistance   Physical Examination  Filed Vitals:   09/01/15 1516  BP: 118/69  Pulse: 85  Temp: 99 F (37.2 C)  TempSrc: Oral  Height: 5\' 1"  (1.549 m)  Weight: 180 lb (81.647 kg)  SpO2: 96%      LLE: L calf incision c/d/i, L groin: viable subcutaneous with limited granulation, no pus or drainage noted, fibrinous exudate sharply debrided; L foot: bandaged   Medical Decision Making  Isabel [name] is a 50 y.o. (March 25, 1966) female  who presents s/p L iliofemoral EA w/ BPA, extended L PFA, L CFA to BK pop w/ Propaten, s/p L 4th and 5th ray amp   Continue Santyl ointment to L groin daily. Wet-to-dry dressing on top daily.  Follow up in 2 weeks.  Suspect will be able to start VAC to R groin in 2 weeks  Thank you for allowing [place] to participate in this patient's care.   Leonides Sake, MD, FACS [facility] [place] Office: [phone] Pager: [phone]  09/01/2015, 3:43 PM

## 2015-09-12 ENCOUNTER — Encounter: Payer: Self-pay | Admitting: Vascular Surgery

## 2015-09-13 NOTE — Progress Notes (Signed)
    Postoperative Visit   History of Present Illness  Isabel [name] is a 50 y.o. (05-Apr-1966) female who presents for postoperative follow-up for:   PROCEDURE: 1. Left iliofemoral endarterectomy with bovine patch angioplasty 2. Extended left profundoplasty  3. Left common femoral artery to below-the-knee popliteal artery bypass with Propaten 4. Left fifth ray amputation (Date: 08/03/15).   She also underwent L 4th and 5th ray amputation on 08/18/15 with Dr. Lajoyce Corners. Pt has followed up with Dr. Lajoyce Corners since her repeat L foot amputation. Sutures were removed out of the left foot recently.  She is getting an insert for her left foot to help ambulation.  Pt has been doing Santyl ointment to L groin with wet-to-dry dressings.   For VQI Use Only  PRE-ADM LIVING: Home  AMB STATUS: Ambulatory   Physical Examination  Filed Vitals:   09/15/15 0931  BP: 126/77  Pulse: 88  Temp: 97.5 F (36.4 C)  TempSrc: Oral  Height: 5\' 1"  (1.549 m)  Weight: 163 lb (73.936 kg)  SpO2: 99%   LLE: L calf incision c/d/i, L groin: viable subcutaneous with good granulation, no pus or drainage noted, wound is contracting >50% from previously; L foot: bandaged, dopplerable strong AT   Medical Decision Making  [name] is a 50 y.o. ([birth year]) female who presents s/p L iliofemoral EA w/ BPA, extended L PFA, L CFA to BK pop w/ Propaten, s/p L 4th and 5th ray amp   Groin wound has contract past the point that VAC wold be beneficial.  Continue wound care as previous.  Follow up for wound check in 4 weeks  Thank you for allowing [place] to participate in this patient's care.   Leonides Sake, MD, FACS [facility] [place] Office: [phone] Pager: [phone]  09/15/2015, 1:48 PM

## 2015-09-15 ENCOUNTER — Encounter: Payer: Self-pay | Admitting: Vascular Surgery

## 2015-09-15 ENCOUNTER — Ambulatory Visit (INDEPENDENT_AMBULATORY_CARE_PROVIDER_SITE_OTHER): Payer: Self-pay | Admitting: Vascular Surgery

## 2015-09-15 VITALS — BP 126/77 | HR 88 | Temp 97.5°F | Ht 61.0 in | Wt 163.0 lb

## 2015-09-15 DIAGNOSIS — I70262 Atherosclerosis of native arteries of extremities with gangrene, left leg: Secondary | ICD-10-CM

## 2015-09-22 ENCOUNTER — Encounter: Payer: Self-pay | Admitting: Family

## 2015-09-22 ENCOUNTER — Telehealth: Payer: Self-pay

## 2015-09-22 ENCOUNTER — Ambulatory Visit (INDEPENDENT_AMBULATORY_CARE_PROVIDER_SITE_OTHER): Payer: BLUE CROSS/BLUE SHIELD | Admitting: Family

## 2015-09-22 VITALS — BP 130/77 | HR 88 | Temp 98.0°F | Resp 20 | Ht 61.0 in | Wt 165.3 lb

## 2015-09-22 DIAGNOSIS — I70245 Atherosclerosis of native arteries of left leg with ulceration of other part of foot: Secondary | ICD-10-CM | POA: Insufficient documentation

## 2015-09-22 DIAGNOSIS — G8918 Other acute postprocedural pain: Secondary | ICD-10-CM | POA: Insufficient documentation

## 2015-09-22 DIAGNOSIS — M79605 Pain in left leg: Secondary | ICD-10-CM | POA: Insufficient documentation

## 2015-09-22 MED ORDER — TRAMADOL HCL 50 MG PO TABS: 50.0000 mg | ORAL_TABLET | Freq: Four times a day (QID) | ORAL | Status: DC | PRN

## 2015-09-22 NOTE — Progress Notes (Signed)
Postoperative Visit   History of Present Illness  Isabel [name] is a 50 y.o. year old female patient of Dr. Imogene Burn who is s/p  1. Left iliofemoral endarterectomy with bovine patch angioplasty 2. Extended left profundoplasty  3. Left common femoral artery to below-the-knee popliteal artery bypass with Propaten 4. Left fifth ray amputation (Date: 08/03/15).   She also underwent L 4th and 5th ray amputation on 08/18/15 with Dr. Lajoyce Corners. Pt has followed up with Dr. Lajoyce Corners since her repeat L foot amputation. Sutures were removed out of the left foot recently. She is getting an insert for her left foot to help ambulation. Pt has been doing Santyl ointment to L groin with wet-to-dry dressings.  Dr. Imogene Burn last saw pt on 09/15/15. At that time LLE: L calf incision c/d/i, L groin: viable subcutaneous with good granulation, no pus or drainage noted, wound is contracting >50% from previously; L foot: bandaged, dopplerable strong AT.  Groin wound had contract past the point that VAC wold be beneficial.  Continue wound care as previous.  Follow up for wound check in 4 weeks  Phone call from pt today;  requested refill on pain medication. Reported she has been walking more, as advised by Dr. Imogene Burn. C/o increased pain in the lateral and medial left knee area, and top of foot. Stated there is some swelling from ankle to the lower calf, that decreases with elevation. Reported the incisional area @ top of thigh continues to have numbness. Rated the pain at 8/10. Stated that she only has 4 pain pills left, and concerned that she won't have enough to get through the weekend. Advised that at this stage in post-op recovery, we would want to have her transition to OTC pain medication. Advised she would need to be re-evaluated in the office for refill on prescription pain medication. She denies fever or chills. She states her left groin incision wound is filling in well and healing well.  Her A1C in April  2017 was 9.3 (review of records) She stopped smoking in 2002, started in 1994.    For VQI Use Only  PRE-ADM LIVING: Home  AMB STATUS: Ambulatory  Physical Examination  Filed Vitals:   09/22/15 1245 09/22/15 1250  BP: 153/82 130/77  Pulse: 88   Temp: 98 F (36.7 C)   TempSrc: Oral   Resp: 20   Height:  (1.549 m)   Weight: 165 lb 4.8 oz (74.98 kg)    Body mass index is 31.25 kg/(m^2).   Left groin incision is granulating well with no signs of infection, no drainage.  Left pedal pulses have a brisk Doppler signals at [facility] and PT. Right DP and PT pulses have a moderate Doppler signal. Left leg incisions are well healed. Left lower leg has 1+ pitting and non pitting edema.   Medical Decision Making  Isabel [name] is a 50 y.o. year old female who presents s/p L iliofemoral EA w/ BPA, extended L PFA, L CFA to BK pop w/ Propaten, s/p L 4th and 5th ray amp.  The patient's bypass incisions are healing appropriately with resolution of pre-operative symptoms.  I discussed with Dr. Imogene Burn pt HPI, physical exam results, and results from [facility] Query (controlled substance report query for this pt since December of 2016).  Pt filled prescriptions for oxycodone or oxycodone containing prescription on 09/01/15, 08/18/15, 08/12/15, and a hydrocodone containing prescription on 07/25/15.  She has filled six prescriptions for alprazolam since December 2016.  After discussing with Dr. Imogene Burn, I gave pt a prescription for tramadol 50 mg, one tablet po every 6 hours prn pain, disp #30, 0 refills.  I discussed in depth with the patient the nature of atherosclerosis, and emphasized the importance of maximal medical management including strict control of blood pressure, blood glucose, and lipid levels, obtaining regular exercise, and cessation of smoking.  The patient is aware that without maximal medical management the underlying atherosclerotic disease process will progress, limiting  the benefit of any interventions. She has a follow appointment with Dr. Imogene Burn on 10/13/15 for wound check.   Thank you for allowing [place] to participate in this patient's care.  Montford Barg, Carma Lair, RN, MSN, FNP-C [facility] [place] Office: [phone]  09/22/2015, 12:54 PM  Clinic MD: Imogene Burn

## 2015-09-22 NOTE — Telephone Encounter (Signed)
Phone call from pt.  Requested refill on pain medication.  Reported she has been walking more, as advised by Dr. Imogene Burn.  C/o increased pain in the lateral and medial left knee area, and top of foot.  Stated there is some swelling from ankle to the lower calf, that decreases with elevation.  Reported the incisional area @ top of thigh continues to have numbness.  Rated the pain at 8/10.  Stated that she only has 4 pain pills left, and concerned that she won't have enough to get through the weekend.  Advised that at this stage in post-op recovery, we would want to have her transition to OTC pain medication.  Advised she would need to be re-evaluated in the office for refill on prescription pain medication.  Agreed.  Appt. Given for 1:15 PM today.

## 2015-10-06 ENCOUNTER — Encounter: Payer: Self-pay | Admitting: Vascular Surgery

## 2015-10-13 ENCOUNTER — Ambulatory Visit: Payer: BLUE CROSS/BLUE SHIELD | Admitting: Vascular Surgery

## 2015-10-24 ENCOUNTER — Encounter: Payer: Self-pay | Admitting: Vascular Surgery

## 2015-10-27 ENCOUNTER — Ambulatory Visit (INDEPENDENT_AMBULATORY_CARE_PROVIDER_SITE_OTHER): Payer: BLUE CROSS/BLUE SHIELD | Admitting: Vascular Surgery

## 2015-10-27 ENCOUNTER — Encounter: Payer: Self-pay | Admitting: Vascular Surgery

## 2015-10-27 VITALS — BP 135/78 | HR 84 | Temp 98.0°F | Ht 61.0 in | Wt 167.5 lb

## 2015-10-27 DIAGNOSIS — I70262 Atherosclerosis of native arteries of extremities with gangrene, left leg: Secondary | ICD-10-CM

## 2015-10-27 NOTE — Progress Notes (Signed)
    Postoperative Visit   History of Present Illness  JAIDAH IWEN is a 50 y.o. (Nov 15, 1965) female  who presents for postoperative follow-up for:   PROCEDURE: 1. Left iliofemoral endarterectomy with bovine patch angioplasty 2. Extended left profundoplasty  3. Left common femoral artery to below-the-knee popliteal artery bypass with Propaten 4. Left fifth ray amputation (Date: 08/03/15).   Pt has healed up all of her incisions at this point.  Her L 4th-5th ray amp has healed.   For VQI Use Only  PRE-ADM LIVING: Home  AMB STATUS: Ambulatory    Physical Examination Filed Vitals:   10/27/15 1004  BP: 135/78  Pulse: 84  Temp: 98 F (36.7 C)  TempSrc: Oral  Height: 5\' 1"  (1.549 m)  Weight: 167 lb 8 oz (75.978 kg)  SpO2: 98%    LLE: L calf incision healed, L groin: healed, L foot: healed, faintly palpable AT   Medical Decision Making  AMERICUS HERSON is a 50 y.o. (12-16-1965) female who presents s/p L iliofemoral EA w/ BPA, extended L PFA, L CFA to BK pop w/ Propaten, s/p L 4th and 5th ray amp   Follow up in 3 months for BLE, L bypass duplex  Thank you for allowing [place] to participate in this patient's care.   Leonides Sake, MD, FACS [facility] [place] Office: [phone] Pager: [phone]

## 2015-11-15 ENCOUNTER — Other Ambulatory Visit: Payer: Self-pay | Admitting: *Deleted

## 2015-11-15 DIAGNOSIS — I739 Peripheral vascular disease, unspecified: Secondary | ICD-10-CM

## 2016-01-29 ENCOUNTER — Encounter: Payer: Self-pay | Admitting: Vascular Surgery

## 2016-02-01 NOTE — Progress Notes (Signed)
Established Previous Bypass   History of Present Illness  Isabel [name] is a 50 y.o. (30-Apr-1965) female who presents with chief complaint: drainage from left groin.   Previous operation(s) include:  1. Left iliofemoral endarterectomy with bovine patch angioplasty 2. Extended left profundoplasty  3. Left common femoral artery to below-the-knee popliteal artery bypass with Propaten 4. Left fifth ray amputation (Date: 08/03/15).   The patient's left leg sx have resolved.  She now notices her right leg more than her left leg.  The patient's symptoms are: mild claudication in right leg and serous drainage from left groin. The patient's treatment regimen currently included: maximal medical management and wound care to left groin.  The patient notes, she has notice multiple episode of healing of left groin followed by spontaneous drainage.  Past Medical History:  Diagnosis Date  . Anxiety   . CHF (congestive heart failure) (HCC)   . Coronary artery disease   . Diabetes mellitus without complication (HCC)    Type 2  . GERD (gastroesophageal reflux disease)   . Peripheral vascular disease (HCC)   . Renal disorder    kidney stones    Past Surgical History:  Procedure Laterality Date  . AMPUTATION Left 08/18/2015   Procedure: Left Foot 4th and 5th Ray Am[facility];  Surgeon: Nadara MustardMarcus Duda V, MD;  [facility];  Service: Orthopedics;  Laterality: Left;  . AMPUTATION TOE Left 08/03/2015   Procedure: AMPUTATION LEFT FIFTH TOE;  Surgeon: Fransisco HertzBrian L Kahley Leib, MD;  [facility];  Service: Vascular;  Laterality: Left;  . BACK SURGERY    . CORONARY ANGIOPLASTY WITH STENT PLACEMENT    . CORONARY ARTERY BYPASS GRAFT N/A 06/08/2013   Procedure: CORONARY ARTERY BYPASS GRAFTING (CABG) times four using left internal mammary and right saphenous vein.;  Surgeon: Delight OvensEdward B Gerhardt, MD;  Location: [facility] OR;  Service: Open Heart Surgery;  Laterality: N/A;  . ENDARTERECTOMY FEMORAL Left  08/03/2015   Procedure: ENDARTERECTOMY LEFT ILIO-FEMORAL PROFUNDA;  Surgeon: Fransisco HertzBrian L Tahliyah Anagnos, MD;  [facility];  Service: Vascular;  Laterality: Left;  . FEMORAL-POPLITEAL BYPASS GRAFT Left 08/03/2015   Procedure: BYPASS GRAFT  LEFT FEMORAL TO  BELOW KNEE POPLITEAL ARTERY USING 6MM X 80CM GORE PROPATEN GRAFT;  Surgeon: Fransisco HertzBrian L Jeanluc Wegman, MD;  [facility];  Service: Vascular;  Laterality: Left;  . INTRAOPERATIVE TRANSESOPHAGEAL ECHOCARDIOGRAM N/A 06/08/2013   Procedure: INTRAOPERATIVE TRANSESOPHAGEAL ECHOCARDIOGRAM;  Surgeon: Delight OvensEdward B Gerhardt, MD;  [facility];  Service: Open Heart Surgery;  Laterality: N/A;  . KIDNEY STONE SURGERY    . LEFT AND RIGHT HEART CATHETERIZATION WITH CORONARY ANGIOGRAM N/A 06/07/2013   Procedure: LEFT AND RIGHT HEART CATHETERIZATION WITH CORONARY ANGIOGRAM;  Surgeon: Marykay Lexavid W Harding, MD;  [facility];  Service: Cardiovascular;  Laterality: N/A;  . LOWER EXTREMITY ANGIOGRAM Bilateral 07/27/2015   Procedure: Lower Extremity Angiogram;  Surgeon: Chuck Hinthristopher S Dickson, MD;  [facility];  Service: Cardiovascular;  Laterality: Bilateral;  . PATCH ANGIOPLASTY Left 08/03/2015   Procedure: PATCH ANGIOPLASTY RIGHT ILIO-FEMORAL PROFUNDA USING XENOSURE BIOLOGIC PATCH;  Surgeon: Fransisco HertzBrian L Issacc Merlo, MD;  [facility];  Service: Vascular;  Laterality: Left;  . PERIPHERAL VASCULAR CATHETERIZATION N/A 07/27/2015   Procedure: Abdominal Aortogram;  Surgeon: Chuck Hinthristopher S Dickson, MD;  [facility];  Service: Cardiovascular;  Laterality: N/A;  . SALPINGOOPHORECTOMY     "? side"  . TONSILLECTOMY    . TUBAL LIGATION  Social History   Social History  . Marital status: Married    Spouse name: N/A  . Number of children: N/A  . Years of education: N/A   Occupational History  . Not on file.   Social History Main Topics  . Smoking status: Former Smoker    Packs/day: 0.50    Years: 10.00    Types: Cigarettes    Start date: 12/26/1992     Quit date: 04/15/2000  . Smokeless tobacco: Never Used     Comment: QUIT SMOKING YEARS AGO "  . Alcohol use 0.0 oz/week     Comment: 07/27/2015 "might have a couple drinks twice/month"  . Drug use: No  . Sexual activity: Not on file   Other Topics Concern  . Not on file   Social History Narrative  . No narrative on file    Family History  Problem Relation Age of Onset  . Heart disease Mother   . Diabetes Mother   . Stroke Mother   . Cancer Father   . Heart disease Father   . Diabetes Father   . Heart disease Brother   . Diabetes Paternal Aunt   . Stroke Maternal Grandmother   . Diabetes Paternal Grandfather     Current Outpatient Prescriptions  Medication Sig Dispense Refill  . ALPRAZolam (XANAX) 1 MG tablet Take 1 mg by mouth 4 (four) times daily as needed for anxiety.     Marland Kitchen aspirin EC 81 MG tablet Take 81 mg by mouth daily.    Marland Kitchen atorvastatin (LIPITOR) 80 MG tablet Take 1 tablet (80 mg total) by mouth daily at 6 PM. 30 tablet 1  . carvedilol (COREG) 3.125 MG tablet Take 1 tablet (3.125 mg total) by mouth 2 (two) times daily. 60 tablet 6  . cefpodoxime (VANTIN) 100 MG tablet     . doxycycline (VIBRA-TABS) 100 MG tablet     . furosemide (LASIX) 40 MG tablet     . HUMALOG KWIKPEN 100 UNIT/ML KiwkPen Inject 10 Units into the skin daily as needed. Per sliding scale. For high sugar    . HYDROcodone-acetaminophen (NORCO) 10-325 MG tablet     . Insulin Detemir (LEVEMIR) 100 UNIT/ML Pen Inject 100 Units into the skin See admin instructions. Take 40 units in the morning, then take 20 units in the afternoon, then take 40 units again to equal out to 100units a day per patient    . Insulin Pen Needle 29G X MISC 1 box 1 each 2  . lisinopril (PRINIVIL,ZESTRIL) 5 MG tablet Take 1 tablet (5 mg total) by mouth daily. 30 tablet 1  . metroNIDAZOLE (FLAGYL) 500 MG tablet     . polyethylene glycol (MIRALAX / GLYCOLAX) packet Take 17 g by mouth daily as needed for mild constipation.     Marland Kitchen  PROAIR HFA 108 (90 BASE) MCG/ACT inhaler Inhale 2 puffs into the lungs 4 (four) times daily as needed for shortness of breath.     . collagenase (SANTYL) ointment Apply 1 application topically daily. (Patient not taking: Reported on 02/02/2016) 30 g 1  . omeprazole (PRILOSEC) 20 MG capsule Take 1 capsule by mouth 2 (two) times daily.     No current facility-administered medications for this visit.      No Known Allergies   REVIEW OF SYSTEMS:  (Positives checked otherwise negative)  CARDIOVASCULAR:   [ ]  chest pain,  [ ]  chest pressure,  [ ]  palpitations,  [ ]  shortness of breath when laying flat,  [ ]   shortness of breath with exertion,   [ ]  pain in feet when walking,  [ ]  pain in feet when laying flat, [ ]  history of blood clot in veins (DVT),  [ ]  history of phlebitis,  [ ]  swelling in legs,  [ ]  varicose veins  PULMONARY:   [ ]  productive cough,  [ ]  asthma,  [ ]  wheezing  NEUROLOGIC:   [ ]  weakness in arms or legs,  [ ]  numbness in arms or legs,  [ ]  difficulty speaking or slurred speech,  [ ]  temporary loss of vision in one eye,  [ ]  dizziness  HEMATOLOGIC:   [ ]  bleeding problems,  [ ]  problems with blood clotting too easily  MUSCULOSKEL:   [ ]  joint pain, [ ]  joint swelling  GASTROINTEST:   [ ]  vomiting blood,  [ ]  blood in stool     GENITOURINARY:   [ ]  burning with urination,  [ ]  blood in urine  PSYCHIATRIC:   [ ]  history of major depression  INTEGUMENTARY:   [ ]  rashes,  [ ]  ulcers  CONSTITUTIONAL:   [ ]  fever,  [ ]  chills    Physical Examination  Vitals:   02/02/16 1424  BP: 112/69  Pulse: 88  Resp: (!) 88  Temp: 99.2 F (37.3 C)  TempSrc: Oral  SpO2: 96%  Weight: 165 lb (74.8 kg)  Height: 5\' 1"  (1.549 m)    Body mass index is 31.18 kg/m.  General: Alert, O x 3, WD,NAD  Pulmonary: Sym exp, good B air movt,CTA B  Cardiac: RRR, Nl S1, S2, no Murmurs, No rubs, No S3,S4  Vascular: Vessel Right Left  Radial Palpable  Palpable  Brachial Palpable Palpable  Carotid Palpable, No Bruit Palpable, No Bruit  Aorta Not palpable N/A  Femoral Palpable Palpable  Popliteal Not palpable Not palpable  PT Not palpable Palpable  DP Not palpable Palpable   Gastrointestinal: soft, non-distended, non-tender to palpation, No guarding or rebound, no HSM, no masses, no CVAT B, No palpable prominent aortic pulse,    Musculoskeletal: M/S 5/5 throughout  , Extremities without ischemic changes except healed L 4-5th TMA, Edema in L legs, small varicosities B, No LDS present, healed calf incision, left groin with raw punctate opening with serous drainage, on Sonosite: fluid cavity ~2-3 deep, superficial to bypass graft  Neurologic: CN 2-12 intact , Pain and light touch intact in extremities , Motor exam as listed above   Non-Invasive Vascular Imaging ABI (Date: 02/01/2016)  R:   ABI: 0.70 (1.23),   DP: mono  PT: mono  TBI:  0.33  L:   ABI: 0.92 (1.14),   DP: bi  PT: bi  TBI: 0.74   Bypass Duplex (Date: 02/01/2016)  Proximal to bypass: 59 c/s  With-in bypass: 53-122 c/s  Distal to bypass: 98 c/s  Widely patent bypass   Medical Decision Making  Isabel [name] is a 50 y.o. female who presents with: s/p s/p L iliofemoral EA w/ BPA, extended L PFA, L CFA to BK pop w/ Propaten, s/p L 4th and 5th ray amp.   I notice on Sonosite a small fluid cavity under the skin.  I suspect she has a residual seroma which is decompressing.  I recommended: incision and drainage of the likely seroma followed by wound care to such to get it to heal up given the present of graft in this groin.  I discussed in depth with the patient the nature of atherosclerosis, and emphasized  the importance of maximal medical management including strict control of blood pressure, blood glucose, and lipid levels, obtaining regular exercise, and cessation of smoking.    The patient is aware that without maximal medical management the  underlying atherosclerotic disease process will progress, limiting the benefit of any interventions.  I discussed in depth with the patient the need for long term surveillance to improve the primary assisted patency of his bypass.  The patient agrees to cooperate with such. The patient is currently on a statin:  Lipitor. The patient is currently on an anti-platelet: ASA.  Thank you for allowing [place] to participate in this patient's care.   Leonides Sake, MD, FACS [facility] Office: [phone] Pager: [phone]

## 2016-02-02 ENCOUNTER — Ambulatory Visit (INDEPENDENT_AMBULATORY_CARE_PROVIDER_SITE_OTHER): Payer: BLUE CROSS/BLUE SHIELD | Admitting: Vascular Surgery

## 2016-02-02 ENCOUNTER — Other Ambulatory Visit: Payer: Self-pay

## 2016-02-02 ENCOUNTER — Ambulatory Visit (HOSPITAL_COMMUNITY)
Admission: RE | Admit: 2016-02-02 | Discharge: 2016-02-02 | Disposition: A | Discharge status: Home / Self Care | Payer: BLUE CROSS/BLUE SHIELD | Source: Ambulatory Visit | Attending: Vascular Surgery | Admitting: Vascular Surgery

## 2016-02-02 ENCOUNTER — Encounter: Payer: Self-pay | Admitting: Vascular Surgery

## 2016-02-02 VITALS — BP 112/69 | HR 88 | Temp 99.2°F | Resp 88 | Ht 61.0 in | Wt 165.0 lb

## 2016-02-02 DIAGNOSIS — I739 Peripheral vascular disease, unspecified: Secondary | ICD-10-CM | POA: Insufficient documentation

## 2016-02-02 DIAGNOSIS — I70245 Atherosclerosis of native arteries of left leg with ulceration of other part of foot: Secondary | ICD-10-CM | POA: Diagnosis not present

## 2016-02-02 DIAGNOSIS — R0989 Other specified symptoms and signs involving the circulatory and respiratory systems: Secondary | ICD-10-CM | POA: Diagnosis present

## 2016-02-02 LAB — VAS US LOWER EXTREMITY ARTERIAL DUPLEX: Right CCA prox sys: 59 cm/s

## 2016-02-05 ENCOUNTER — Telehealth: Payer: Self-pay

## 2016-02-05 NOTE — Telephone Encounter (Signed)
Received call from Isabel [name] requesting to cancel her procedure for tomorrow. Patient is scheduled for Left groin irrigation and debridement with Dr. Imogene Burn. Patient states, "it is looking a lot better and I just want to wait and see if it gets better before I have another procedure." Patient further states, "it seems to be healing up." Instructed patient to contact our office with any questions/concerns and/or to reschedule procedure. Patient verbalized understanding.

## 2016-02-06 ENCOUNTER — Encounter (HOSPITAL_COMMUNITY): Admission: RE | Payer: Self-pay | Source: Ambulatory Visit

## 2016-02-06 ENCOUNTER — Ambulatory Visit (HOSPITAL_COMMUNITY)
Admission: RE | Admit: 2016-02-06 | Payer: BLUE CROSS/BLUE SHIELD | Source: Ambulatory Visit | Admitting: Vascular Surgery

## 2016-02-06 SURGERY — IRRIGATION AND DEBRIDEMENT EXTREMITY
Anesthesia: Choice | Site: Groin | Laterality: Left

## 2016-03-04 ENCOUNTER — Telehealth: Payer: Self-pay

## 2016-03-04 NOTE — Telephone Encounter (Signed)
Pt. Called to report continued intermittent drainage from left groin.  Stated the area almost completely closes and then is will open and drain a "pinkish" fluid.  Stated the area "stays sore."  Reported she is cleaning the site with NS and packing with gauze.  Stated "I think I need to have something done."  Advised will discuss with Dr. Imogene Burn, re: scheduling procedure versus scheduling an office appt. for further eval.; will call back with recommendation.  Agreed.

## 2016-03-04 NOTE — Telephone Encounter (Signed)
rec'd recommendation from Dr. Imogene Burn to schedule pt. For procedure for I & D Left groin.  Pt. Notified.  Procedure scheduled for 03/18/16.  Will fax instructions to her office.

## 2016-03-05 ENCOUNTER — Other Ambulatory Visit: Payer: Self-pay

## 2016-03-22 ENCOUNTER — Encounter (HOSPITAL_COMMUNITY): Payer: Self-pay | Admitting: *Deleted

## 2016-03-22 NOTE — Progress Notes (Addendum)
Called patient several times today, reached her at 16- Isabel [name] said that she called and left a message at Dr Nicky Pugh office on Monday that the area has healed and she no longer needs surgery.  I called answering service and Dr Imogene Burn is not on call. I called Judeth Cornfield number to leave a message- voice mail was not on.  I notified OR that patient is cancelling

## 2016-03-25 ENCOUNTER — Ambulatory Visit (HOSPITAL_COMMUNITY)
Admission: RE | Admit: 2016-03-25 | Payer: BLUE CROSS/BLUE SHIELD | Source: Ambulatory Visit | Admitting: Vascular Surgery

## 2016-03-25 ENCOUNTER — Encounter (HOSPITAL_COMMUNITY): Admission: RE | Payer: Self-pay | Source: Ambulatory Visit

## 2016-03-25 HISTORY — DX: Personal history of urinary calculi: Z87.442

## 2016-03-25 SURGERY — IRRIGATION AND DEBRIDEMENT EXTREMITY
Anesthesia: Choice | Site: Groin | Laterality: Left

## 2016-08-26 ENCOUNTER — Other Ambulatory Visit: Payer: Self-pay | Admitting: *Deleted

## 2016-08-26 DIAGNOSIS — I739 Peripheral vascular disease, unspecified: Secondary | ICD-10-CM

## 2016-08-26 DIAGNOSIS — Z48812 Encounter for surgical aftercare following surgery on the circulatory system: Secondary | ICD-10-CM

## 2017-02-28 ENCOUNTER — Encounter (HOSPITAL_COMMUNITY): Payer: BLUE CROSS/BLUE SHIELD

## 2017-02-28 ENCOUNTER — Ambulatory Visit: Payer: BLUE CROSS/BLUE SHIELD | Admitting: Family

## 2017-02-28 ENCOUNTER — Ambulatory Visit: Payer: BLUE CROSS/BLUE SHIELD

## 2017-05-21 NOTE — Progress Notes (Signed)
Cardiology Office Note    Date:  05/22/2017   ID:  Isabel [name], DOB 10/07/1965, MRN [id number]  PCP:  [name], MD  Cardiologist: Dr. Purvis Sheffield   Chief Complaint  Patient presents with  . Follow-up    History of Present Illness:    Isabel [name] is a 52 y.o. female with past medical history of CAD (s/p CABG in 2015 with LIMA-LAD, seq reverse SVG-OM-dLCx, and reverse SVG-dRCA), HTN, HLD, PVD (s/p L fem-pop bypass in 07/2015 with left fifth ray amputation in 08/2015), and uncontrolled IDDM who presents to the office today for overdue follow-up.   She was last examined by Dr. Purvis Sheffield in 12/2014 and was doing well from a cardiac perspective at that time and continued on her current medication regimen. 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   In talking with the patient today, she reports overall doing well from a cardiac perspective since her last office visit. She has missed several follow-up appointments due to losing her job and not having insurance coverage. She was recently hired by [employer] who requested she follow-up with her Cardiologist due to her work evaluation showing an abnormal EKG. She reports being able to start working as soon as she is cleared.    She denies any recent episodes of chest discomfort, dyspnea on exertion, orthopnea, PND, or lower extremity edema. Does not exercise regularly but is active in performing household chores and climbing stairs, denying any anginal symptoms with these activities. She has been compliant with all of her medications except for insulin as she is close to running out of the medication and is unable to afford the vials as they are $800 each. Therefore she has only been taking half of her scheduled regimen.   Past Medical History:    Diagnosis Date  . Anxiety   . CHF (congestive heart failure) (HCC)   . Coronary artery disease    a. s/p CABG in 2015 with LIMA-LAD, seq reverse SVG-OM-dLCx, and reverse SVG-dRCA  . Diabetes mellitus without complication (HCC)    Type 2  . GERD (gastroesophageal reflux disease)   . History of kidney stones   . Peripheral vascular disease [facility])     Past Surgical History:  Procedure Laterality Date  . AMPUTATION Left 08/18/2015   Procedure: Left Foot 4th and 5th Ray Amputation With [facility] Placement;  Surgeon: Nadara Mustard, MD;  Location: [facility] OR;  Service: Orthopedics;  Laterality: Left;  . AMPUTATION TOE Left 08/03/2015   Procedure: AMPUTATION LEFT FIFTH TOE;  Surgeon: Fransisco Hertz, MD;  Location: [facility] OR;  Service: Vascular;  Laterality: Left;  . BACK SURGERY    . CORONARY ANGIOPLASTY WITH STENT PLACEMENT    . CORONARY ARTERY BYPASS GRAFT N/A 06/08/2013   Procedure: CORONARY ARTERY BYPASS GRAFTING (CABG) times four using left internal mammary and right saphenous vein.;  Surgeon: Delight Ovens, MD;  Location: [facility] OR;  Service: Open Heart Surgery;  Laterality: N/A;  . ENDARTERECTOMY FEMORAL Left 08/03/2015   Procedure: ENDARTERECTOMY LEFT ILIO-FEMORAL PROFUNDA;  Surgeon: Fransisco Hertz, MD;  Location: [facility] OR;  Service: Vascular;  Laterality: Left;  . FEMORAL-POPLITEAL BYPASS GRAFT Left 08/03/2015   Procedure: BYPASS GRAFT  LEFT FEMORAL TO  BELOW KNEE POPLITEAL ARTERY USING X 80CM GORE PROPATEN GRAFT;  Surgeon:  Fransisco Hertz, MD;  Location: [facility] OR;  Service: Vascular;  Laterality: Left;  . INTRAOPERATIVE TRANSESOPHAGEAL ECHOCARDIOGRAM N/A 06/08/2013   Procedure: INTRAOPERATIVE TRANSESOPHAGEAL ECHOCARDIOGRAM;  Surgeon: Delight Ovens, MD;  Location: [facility] OR;  Service: Open Heart Surgery;  Laterality: N/A;  . KIDNEY STONE SURGERY    . LEFT AND RIGHT HEART CATHETERIZATION WITH CORONARY ANGIOGRAM N/A 06/07/2013   Procedure: LEFT AND RIGHT HEART CATHETERIZATION WITH CORONARY ANGIOGRAM;  Surgeon: Marykay Lex, MD;  Location: [facility] CATH LAB;  Service: Cardiovascular;  Laterality: N/A;  . LOWER EXTREMITY ANGIOGRAM Bilateral 07/27/2015   Procedure: Lower Extremity Angiogram;  Surgeon: Chuck Hint, MD;  Location: [facility] INVASIVE CV LAB;  Service: Cardiovascular;  Laterality: Bilateral;  . PATCH ANGIOPLASTY Left 08/03/2015   Procedure: PATCH ANGIOPLASTY RIGHT ILIO-FEMORAL PROFUNDA USING XENOSURE BIOLOGIC PATCH;  Surgeon: Fransisco Hertz, MD;  Location: [facility] OR;  Service: Vascular;  Laterality: Left;  . PERIPHERAL VASCULAR CATHETERIZATION N/A 07/27/2015   Procedure: Abdominal Aortogram;  Surgeon: Chuck Hint, MD;  Location: [facility] INVASIVE CV LAB;  Service: Cardiovascular;  Laterality: N/A;  . SALPINGOOPHORECTOMY     "? side"  . TONSILLECTOMY    . TUBAL LIGATION      Current Medications: Outpatient Medications Prior to Visit  Medication Sig Dispense Refill  . ALPRAZolam (XANAX) 1 MG tablet Take 1 mg by mouth 4 (four) times daily as needed for anxiety.     Marland Kitchen aspirin EC 81 MG tablet Take 81 mg by mouth daily.    Marland Kitchen atorvastatin (LIPITOR) 80 MG tablet Take 1 tablet (80 mg total) by mouth daily at 6 PM. 30 tablet 1  . carvedilol (COREG) 3.125 MG tablet Take 1 tablet (3.125 mg total) by mouth 2 (two) times daily. 60 tablet 6  . HUMALOG KWIKPEN 100 UNIT/ML KiwkPen Inject 10 Units into the skin daily as needed. Per sliding scale. For high sugar    . Insulin Detemir (LEVEMIR) 100 UNIT/ML Pen Inject 100 Units into the skin See admin instructions. Take 40 units in the morning, then take 20 units in the afternoon, then take 40 units again to equal out to 100units a day per patient    . lisinopril (PRINIVIL,ZESTRIL) 5 MG tablet Take 1 tablet (5 mg total) by mouth daily. 30 tablet 1  . omeprazole (PRILOSEC) 20 MG capsule Take 1 capsule by mouth 2 (two) times daily.    . polyethylene glycol (MIRALAX / GLYCOLAX) packet Take 17 g by mouth daily as needed for mild constipation.     Marland Kitchen PROAIR HFA 108 (90 BASE)  MCG/ACT inhaler Inhale 2 puffs into the lungs 4 (four) times daily as needed for shortness of breath.     . traMADol (ULTRAM) 50 MG tablet Take 50 mg by mouth every 6 (six) hours as needed.    . furosemide (LASIX) 40 MG tablet Take 40 mg by mouth daily.     . cefpodoxime (VANTIN) 100 MG tablet     . doxycycline (VIBRA-TABS) 100 MG tablet     . HYDROcodone-acetaminophen (NORCO) 10-325 MG tablet     . metroNIDAZOLE (FLAGYL) 500 MG tablet      No facility-administered medications prior to visit.      Allergies:   No known allergies   Social History   Socioeconomic History  . Marital status: Married    Spouse name: None  . Number of children: None  . Years of education: None  . Highest education level: None  Social Needs  .  Financial resource strain: None  . Food insecurity - worry: None  . Food insecurity - inability: None  . Transportation needs - medical: None  . Transportation needs - non-medical: None  Occupational History  . None  Tobacco Use  . Smoking status: Former Smoker    Packs/day: 0.50    Years: 10.00    Pack years: 5.00    Types: Cigarettes    Start date: 12/26/1992    Last attempt to quit: 04/15/2000    Years since quitting: 17.1  . Smokeless tobacco: Never Used  . Tobacco comment: QUIT SMOKING YEARS AGO "  Substance and Sexual Activity  . Alcohol use: Yes    Alcohol/week: 0.0 oz    Comment: 07/27/2015 "might have a couple drinks twice/month"  . Drug use: No  . Sexual activity: None  Other Topics Concern  . None  Social History Narrative  . None     Family History:  The patient's family history includes Cancer in her father; Diabetes in her father, mother, paternal aunt, and paternal grandfather; Heart disease in her brother, father, and mother; Stroke in her maternal grandmother and mother.   Review of Systems:   Please see the history of present illness.     General:  No chills, fever, night sweats or weight changes.  Cardiovascular:  No chest pain,  dyspnea on exertion, edema, orthopnea, palpitations, paroxysmal nocturnal dyspnea. Dermatological: No rash, lesions/masses Respiratory: No cough, dyspnea Urologic: No hematuria, dysuria Abdominal:   No nausea, vomiting, diarrhea, bright red blood per rectum, melena, or hematemesis Neurologic:  No visual changes, wkns, changes in mental status.  She denies any of the above symptoms.   All other systems reviewed and are otherwise negative except as noted above.   Physical Exam:    VS:  BP 138/80   Pulse 90   Ht 5\' 1"  (1.549 m)   Wt 156 lb (70.8 kg)   SpO2 97%   BMI 29.48 kg/m    General: Well developed, well nourished Caucasian female appearing in no acute distress. Head: Normocephalic, atraumatic, sclera non-icteric, no xanthomas, nares are without discharge.  Neck: No carotid bruits. JVD not elevated.  Lungs: Respirations regular and unlabored, without wheezes or rales.  Heart: Regular rate and rhythm. No S3 or S4.  No murmur, no rubs, or gallops appreciated. Sternal scar noted.  Abdomen: Soft, non-tender, non-distended with normoactive bowel sounds. No hepatomegaly. No rebound/guarding. No obvious abdominal masses. Msk:  Strength and tone appear normal for age. No joint deformities or effusions. Extremities: No clubbing or cyanosis. No edema.  Distal pedal pulses are 2+ bilaterally. Neuro: Alert and oriented X 3. Moves all extremities spontaneously. No focal deficits noted. Psych:  Responds to questions appropriately with a normal affect. Skin: No rashes or lesions noted  Wt Readings from Last 3 Encounters:  05/22/17 156 lb (70.8 kg)  02/02/16 165 lb (74.8 kg)  10/27/15 167 lb 8 oz (76 kg)     Studies/Labs Reviewed:   EKG:  EKG is ordered today.  The ekg ordered today demonstrates NSR, HR 84, with lateral TWI (similar to prior tracings).   Recent Labs: 05/22/2017: BUN 17; Creatinine, Ser 1.02; Potassium 3.9; Sodium 136   Lipid Panel    Component Value Date/Time   CHOL  278 (H) 06/05/2013 0515   TRIG 295 (H) 06/05/2013 0515   HDL 27 (L) 06/05/2013 0515   CHOLHDL 10.3 06/05/2013 0515   VLDL 59 (H) 06/05/2013 0515   LDLCALC 192 (H) 06/05/2013 0515  Additional studies/ records that were reviewed today include:   Echocardiogram: 02/2014 Study Conclusions  - Left ventricle: The cavity size was normal. Wall thickness was normal. Systolic function was normal. The estimated ejection fraction was in the range of 60% to 65%. Doppler parameters are consistent with abnormal left ventricular relaxation (grade 1 diastolic dysfunction). - Aortic valve: Mildly calcified annulus. Trileaflet; mildly thickened leaflets. Valve area (VTI): 2.26 cm^2. Valve area (Vmax): 2.14 cm^2. - Mitral valve: Mildly calcified annulus. Mildly thickened leaflets . - Left atrium: The atrium was mildly dilated. - Right ventricle: The RV is poorly visualized, it appears grossly normal in size and function. - Technically difficult study  Assessment:    1. Coronary artery disease involving native coronary artery of native heart without angina pectoris   2. S/P CABG x 4   3. Essential hypertension   4. Hyperlipidemia LDL goal <70   5. IDDM (insulin dependent diabetes mellitus) (HCC)   6. PVD (peripheral vascular disease) (HCC)      Plan:   In order of problems listed above:  1. CAD - she is s/p CABG in 2015 with LIMA-LAD, seq reverse SVG-OM-dLCx, and reverse SVG-dRCA.  - she denies any recent chest discomfort or dyspnea on exertion and her EKG today shows lateral TWI which is similar to her prior tracings. - In reviewing her work description, her job will mostly consist of standing and moving boxes which will be between 5-30 pounds. With her denying any recent symptoms and being this far out from CABG, I do not see any contraindications to this and will provide a letter today stating she is cleared to work. - will continue on ASA, statin, and BB therapy.    2. HTN - BP is well controlled at 138/80 during today's visit. - Continue Coreg 3.125 mg twice daily, Lasix 40 mg daily, and Lisinopril 5 mg daily. Will check BMET today to assess kidney function and K+ levels.   3. HLD - no recent FLP on file. She is not fasting today and requests to have fasting labs obtained once she has insurance coverage which should hopefully be within the next few weeks. Will obtain FLP and LFT's at that time.  4. Uncontrolled IDDM - followed by PCP. She reports noncompliance with insulin as she is close to running out of the medication and is unable to afford the vials as they are $800 each. Therefore she has only been taking half of her scheduled regimen. Will recheck Hgb A1c today as she reports this is required by her work. Was elevated to 9.3 in 07/2015.  5. PVD - s/p L fem-pop bypass in 07/2015 with left fifth ray amputation in 08/2015. - she denies any recent claudications symptoms. Followed by Vascular Surgery.    Medication Adjustments/Labs and Tests Ordered: Current medicines are reviewed at length with the patient today.  Concerns regarding medicines are outlined above.  Medication changes, Labs and Tests ordered today are listed in the Patient Instructions below. Patient Instructions  Medication Instructions:  Your physician recommends that you continue on your current medications as directed. Please refer to the Current Medication list given to you today.  Labwork: Your physician recommends that you return for lab work in: Today   Testing/Procedures: NONE   Follow-Up: Your physician wants you to follow-up in: 6 Months. You will receive a reminder letter in the mail two months in advance. If you don't receive a letter, please call our office to schedule the follow-up appointment.  Any Other  Special Instructions Will Be Listed Below (If Applicable).  If you need a refill on your cardiac medications before your next appointment, please call your  pharmacy. Thank you for choosing [facility]!    Signed, Ellsworth Lennox, PA-C  05/22/2017 5:20 PM    [facility] [address] [place], [place] [address] Phone: [phone]

## 2017-05-22 ENCOUNTER — Encounter: Payer: Self-pay | Admitting: Student

## 2017-05-22 ENCOUNTER — Other Ambulatory Visit (HOSPITAL_COMMUNITY)
Admission: RE | Admit: 2017-05-22 | Discharge: 2017-05-22 | Disposition: A | Discharge status: Home / Self Care | Payer: Self-pay | Source: Ambulatory Visit | Attending: Student | Admitting: Student

## 2017-05-22 ENCOUNTER — Ambulatory Visit (INDEPENDENT_AMBULATORY_CARE_PROVIDER_SITE_OTHER): Payer: Self-pay | Admitting: Student

## 2017-05-22 VITALS — BP 138/80 | HR 90 | Ht 61.0 in | Wt 156.0 lb

## 2017-05-22 DIAGNOSIS — I2510 Atherosclerotic heart disease of native coronary artery without angina pectoris: Secondary | ICD-10-CM

## 2017-05-22 DIAGNOSIS — E119 Type 2 diabetes mellitus without complications: Secondary | ICD-10-CM | POA: Insufficient documentation

## 2017-05-22 DIAGNOSIS — IMO0001 Reserved for inherently not codable concepts without codable children: Secondary | ICD-10-CM

## 2017-05-22 DIAGNOSIS — Z951 Presence of aortocoronary bypass graft: Secondary | ICD-10-CM

## 2017-05-22 DIAGNOSIS — Z794 Long term (current) use of insulin: Secondary | ICD-10-CM | POA: Insufficient documentation

## 2017-05-22 DIAGNOSIS — I739 Peripheral vascular disease, unspecified: Secondary | ICD-10-CM

## 2017-05-22 DIAGNOSIS — I10 Essential (primary) hypertension: Secondary | ICD-10-CM

## 2017-05-22 DIAGNOSIS — E785 Hyperlipidemia, unspecified: Secondary | ICD-10-CM

## 2017-05-22 LAB — BASIC METABOLIC PANEL
Anion gap: 9 (ref 5–15)
BUN: 17 mg/dL (ref 6–20)
CHLORIDE: 101 mmol/L (ref 101–111)
CO2: 26 mmol/L (ref 22–32)
Calcium: 9.3 mg/dL (ref 8.9–10.3)
Creatinine, Ser: 1.02 mg/dL — ABNORMAL HIGH (ref 0.44–1.00)
GFR calc Af Amer: >60 mL/min (ref >60)
GFR calc non Af Amer: >60 mL/min (ref >60)
GLUCOSE: 266 mg/dL — AB (ref 65–99)
POTASSIUM: 3.9 mmol/L (ref 3.5–5.1)
Sodium: 136 mmol/L (ref 135–145)

## 2017-05-22 LAB — HEMOGLOBIN A1C
Hgb A1c MFr Bld: 10.9 % — ABNORMAL HIGH (ref 4.8–5.6)
Mean Plasma Glucose: 266.13 mg/dL

## 2017-05-22 NOTE — Patient Instructions (Signed)
Medication Instructions:  Your physician recommends that you continue on your current medications as directed. Please refer to the Current Medication list given to you today.   Labwork: Your physician recommends that you return for lab work in: Today   Testing/Procedures: NONE   Follow-Up: Your physician wants you to follow-up in: 6 Months. You will receive a reminder letter in the mail two months in advance. If you don't receive a letter, please call our office to schedule the follow-up appointment.   Any Other Special Instructions Will Be Listed Below (If Applicable).     If you need a refill on your cardiac medications before your next appointment, please call your pharmacy.  Thank you for choosing [facility]!   

## 2017-06-13 ENCOUNTER — Telehealth: Payer: Self-pay | Admitting: Cardiovascular Disease

## 2017-06-13 NOTE — Telephone Encounter (Signed)
Please give pt a call--would like to have more blood work ordered. Can be reached @ [phone]

## 2017-06-13 NOTE — Telephone Encounter (Signed)
Returned pt call. She stated that she needs to have another a1c done, as she applied for a job and her A1C in February was too high. They asked her to repeat it to see if it is lower. I let her I would ask Randall An, PA, and let her know next week.

## 2017-06-15 NOTE — Telephone Encounter (Signed)
   Please let the patient know a Hgb A1c is a 74-month average of blood sugar readings and with this just being checked on 05/22/2017, it has likely not changed significantly over the past 3 weeks. She should be following her glucose readings at home. At the time of her visit, she did not have insurance coverage and says her PCP would not accept self-pay. If still not able to see her PCP, she should be referred to the Health Department for if she is still only taking half of her insulin dosing, then blood sugar likely remains significantly elevated and any Insulin dose changes would need to come from a Primary Care Provider.   Thanks,  [place]

## 2017-06-16 NOTE — Telephone Encounter (Signed)
Called pt. Voicemail full. Mailed letter advising her of instructions.

## 2017-08-24 ENCOUNTER — Other Ambulatory Visit: Payer: Self-pay

## 2017-08-24 ENCOUNTER — Emergency Department (HOSPITAL_COMMUNITY): Payer: Self-pay

## 2017-08-24 ENCOUNTER — Inpatient Hospital Stay (HOSPITAL_COMMUNITY)
Admission: EM | Admit: 2017-08-24 | Discharge: 2017-09-01 | DRG: 629 | Disposition: A | Discharge status: Home / Self Care | Payer: Self-pay | Attending: Internal Medicine | Admitting: Internal Medicine

## 2017-08-24 ENCOUNTER — Encounter (HOSPITAL_COMMUNITY): Payer: Self-pay | Admitting: Emergency Medicine

## 2017-08-24 DIAGNOSIS — L97509 Non-pressure chronic ulcer of other part of unspecified foot with unspecified severity: Secondary | ICD-10-CM

## 2017-08-24 DIAGNOSIS — Z560 Unemployment, unspecified: Secondary | ICD-10-CM

## 2017-08-24 DIAGNOSIS — I2510 Atherosclerotic heart disease of native coronary artery without angina pectoris: Secondary | ICD-10-CM | POA: Diagnosis present

## 2017-08-24 DIAGNOSIS — I998 Other disorder of circulatory system: Secondary | ICD-10-CM | POA: Diagnosis present

## 2017-08-24 DIAGNOSIS — T508X5A Adverse effect of diagnostic agents, initial encounter: Secondary | ICD-10-CM | POA: Diagnosis not present

## 2017-08-24 DIAGNOSIS — Z87891 Personal history of nicotine dependence: Secondary | ICD-10-CM

## 2017-08-24 DIAGNOSIS — K219 Gastro-esophageal reflux disease without esophagitis: Secondary | ICD-10-CM | POA: Diagnosis present

## 2017-08-24 DIAGNOSIS — M869 Osteomyelitis, unspecified: Secondary | ICD-10-CM | POA: Diagnosis present

## 2017-08-24 DIAGNOSIS — E0865 Diabetes mellitus due to underlying condition with hyperglycemia: Secondary | ICD-10-CM

## 2017-08-24 DIAGNOSIS — Z7982 Long term (current) use of aspirin: Secondary | ICD-10-CM

## 2017-08-24 DIAGNOSIS — M8610 Other acute osteomyelitis, unspecified site: Secondary | ICD-10-CM

## 2017-08-24 DIAGNOSIS — E11621 Type 2 diabetes mellitus with foot ulcer: Principal | ICD-10-CM | POA: Diagnosis present

## 2017-08-24 DIAGNOSIS — I509 Heart failure, unspecified: Secondary | ICD-10-CM | POA: Diagnosis present

## 2017-08-24 DIAGNOSIS — E1165 Type 2 diabetes mellitus with hyperglycemia: Secondary | ICD-10-CM | POA: Diagnosis present

## 2017-08-24 DIAGNOSIS — F419 Anxiety disorder, unspecified: Secondary | ICD-10-CM | POA: Diagnosis present

## 2017-08-24 DIAGNOSIS — Z79899 Other long term (current) drug therapy: Secondary | ICD-10-CM

## 2017-08-24 DIAGNOSIS — T383X6A Underdosing of insulin and oral hypoglycemic [antidiabetic] drugs, initial encounter: Secondary | ICD-10-CM | POA: Diagnosis present

## 2017-08-24 DIAGNOSIS — L97519 Non-pressure chronic ulcer of other part of right foot with unspecified severity: Secondary | ICD-10-CM | POA: Diagnosis present

## 2017-08-24 DIAGNOSIS — I70209 Unspecified atherosclerosis of native arteries of extremities, unspecified extremity: Secondary | ICD-10-CM | POA: Diagnosis present

## 2017-08-24 DIAGNOSIS — E1151 Type 2 diabetes mellitus with diabetic peripheral angiopathy without gangrene: Secondary | ICD-10-CM | POA: Diagnosis present

## 2017-08-24 DIAGNOSIS — IMO0002 Reserved for concepts with insufficient information to code with codable children: Secondary | ICD-10-CM | POA: Diagnosis present

## 2017-08-24 DIAGNOSIS — E785 Hyperlipidemia, unspecified: Secondary | ICD-10-CM | POA: Diagnosis present

## 2017-08-24 DIAGNOSIS — Z951 Presence of aortocoronary bypass graft: Secondary | ICD-10-CM

## 2017-08-24 DIAGNOSIS — Z89422 Acquired absence of other left toe(s): Secondary | ICD-10-CM

## 2017-08-24 DIAGNOSIS — E1169 Type 2 diabetes mellitus with other specified complication: Secondary | ICD-10-CM | POA: Diagnosis present

## 2017-08-24 DIAGNOSIS — N141 Nephropathy induced by other drugs, medicaments and biological substances: Secondary | ICD-10-CM | POA: Diagnosis not present

## 2017-08-24 DIAGNOSIS — I739 Peripheral vascular disease, unspecified: Secondary | ICD-10-CM

## 2017-08-24 DIAGNOSIS — I110 Hypertensive heart disease with heart failure: Secondary | ICD-10-CM | POA: Diagnosis present

## 2017-08-24 DIAGNOSIS — N179 Acute kidney failure, unspecified: Secondary | ICD-10-CM | POA: Diagnosis present

## 2017-08-24 DIAGNOSIS — Z955 Presence of coronary angioplasty implant and graft: Secondary | ICD-10-CM

## 2017-08-24 DIAGNOSIS — E11649 Type 2 diabetes mellitus with hypoglycemia without coma: Secondary | ICD-10-CM | POA: Diagnosis not present

## 2017-08-24 DIAGNOSIS — Z794 Long term (current) use of insulin: Secondary | ICD-10-CM

## 2017-08-24 DIAGNOSIS — D649 Anemia, unspecified: Secondary | ICD-10-CM | POA: Diagnosis not present

## 2017-08-24 DIAGNOSIS — J449 Chronic obstructive pulmonary disease, unspecified: Secondary | ICD-10-CM | POA: Diagnosis present

## 2017-08-24 DIAGNOSIS — E1152 Type 2 diabetes mellitus with diabetic peripheral angiopathy with gangrene: Secondary | ICD-10-CM | POA: Diagnosis present

## 2017-08-24 DIAGNOSIS — R402414 Glasgow coma scale score 13-15, 24 hours or more after hospital admission: Secondary | ICD-10-CM | POA: Diagnosis not present

## 2017-08-24 DIAGNOSIS — Y92238 Other place in hospital as the place of occurrence of the external cause: Secondary | ICD-10-CM | POA: Diagnosis not present

## 2017-08-24 DIAGNOSIS — L0390 Cellulitis, unspecified: Secondary | ICD-10-CM

## 2017-08-24 LAB — BASIC METABOLIC PANEL
ANION GAP: 9 (ref 5–15)
BUN: 18 mg/dL (ref 6–20)
CALCIUM: 9.4 mg/dL (ref 8.9–10.3)
CO2: 27 mmol/L (ref 22–32)
Chloride: 96 mmol/L — ABNORMAL LOW (ref 101–111)
Creatinine, Ser: 0.91 mg/dL (ref 0.44–1.00)
Glucose, Bld: 570 mg/dL (ref 65–99)
POTASSIUM: 4.2 mmol/L (ref 3.5–5.1)
Sodium: 132 mmol/L — ABNORMAL LOW (ref 135–145)

## 2017-08-24 LAB — CBC
HEMATOCRIT: 36.7 % (ref 36.0–46.0)
Hemoglobin: 12.3 g/dL (ref 12.0–15.0)
MCH: 29.6 pg (ref 26.0–34.0)
MCHC: 33.5 g/dL (ref 30.0–36.0)
MCV: 88.4 fL (ref 78.0–100.0)
PLATELETS: 208 10*3/uL (ref 150–400)
RBC: 4.15 MIL/uL (ref 3.87–5.11)
RDW: 12.6 % (ref 11.5–15.5)
WBC: 6.4 10*3/uL (ref 4.0–10.5)

## 2017-08-24 LAB — CBG MONITORING, ED
GLUCOSE-CAPILLARY: 337 mg/dL — AB (ref 65–99)
Glucose-Capillary: 526 mg/dL (ref 65–99)

## 2017-08-24 LAB — GLUCOSE, CAPILLARY: GLUCOSE-CAPILLARY: 352 mg/dL — AB (ref 65–99)

## 2017-08-24 MED ORDER — LISINOPRIL 5 MG PO TABS
5.0000 mg | ORAL_TABLET | Freq: Every day | ORAL | Status: DC
Start: 2017-08-24 — End: 2017-08-25
  Administered 2017-08-24 – 2017-08-25 (×2): 5 mg via ORAL
  Filled 2017-08-24 (×2): qty 1

## 2017-08-24 MED ORDER — ENOXAPARIN SODIUM 40 MG/0.4ML ~~LOC~~ SOLN
40.0000 mg | SUBCUTANEOUS | Status: DC
  Administered 2017-08-24 – 2017-08-27 (×4): 40 mg via SUBCUTANEOUS
  Filled 2017-08-24 (×4): qty 0.4

## 2017-08-24 MED ORDER — ACETAMINOPHEN 325 MG PO TABS
650.0000 mg | ORAL_TABLET | Freq: Four times a day (QID) | ORAL | Status: DC | PRN
  Administered 2017-08-24: 650 mg via ORAL
  Filled 2017-08-24: qty 2

## 2017-08-24 MED ORDER — PANTOPRAZOLE SODIUM 40 MG PO TBEC
40.0000 mg | DELAYED_RELEASE_TABLET | Freq: Every day | ORAL | Status: DC
  Administered 2017-08-24 – 2017-09-01 (×9): 40 mg via ORAL
  Filled 2017-08-24 (×9): qty 1

## 2017-08-24 MED ORDER — HYDROCODONE-ACETAMINOPHEN 5-325 MG PO TABS
2.0000 | ORAL_TABLET | Freq: Once | ORAL | Status: AC
  Administered 2017-08-24: 2 via ORAL
  Filled 2017-08-24: qty 2

## 2017-08-24 MED ORDER — ATORVASTATIN CALCIUM 80 MG PO TABS
80.0000 mg | ORAL_TABLET | Freq: Every day | ORAL | Status: DC
  Administered 2017-08-24 – 2017-08-31 (×6): 80 mg via ORAL
  Filled 2017-08-24: qty 2
  Filled 2017-08-24 (×3): qty 1
  Filled 2017-08-24 (×2): qty 2
  Filled 2017-08-24: qty 1

## 2017-08-24 MED ORDER — ALPRAZOLAM 0.5 MG PO TABS
1.0000 mg | ORAL_TABLET | Freq: Three times a day (TID) | ORAL | Status: DC | PRN
  Administered 2017-08-24 – 2017-08-28 (×2): 1 mg via ORAL
  Filled 2017-08-24: qty 1
  Filled 2017-08-24: qty 2

## 2017-08-24 MED ORDER — INSULIN ASPART 100 UNIT/ML ~~LOC~~ SOLN
0.0000 [IU] | Freq: Three times a day (TID) | SUBCUTANEOUS | Status: DC
  Administered 2017-08-25 (×3): 3 [IU] via SUBCUTANEOUS
  Administered 2017-08-26 (×2): 5 [IU] via SUBCUTANEOUS
  Administered 2017-08-26: 3 [IU] via SUBCUTANEOUS
  Administered 2017-08-27: 5 [IU] via SUBCUTANEOUS
  Administered 2017-08-27: 2 [IU] via SUBCUTANEOUS
  Administered 2017-08-27: 8 [IU] via SUBCUTANEOUS
  Administered 2017-08-29: 5 [IU] via SUBCUTANEOUS
  Administered 2017-08-29: 2 [IU] via SUBCUTANEOUS

## 2017-08-24 MED ORDER — CARVEDILOL 3.125 MG PO TABS
3.1250 mg | ORAL_TABLET | Freq: Two times a day (BID) | ORAL | Status: DC
  Administered 2017-08-24 – 2017-09-01 (×14): 3.125 mg via ORAL
  Filled 2017-08-24 (×15): qty 1

## 2017-08-24 MED ORDER — PIPERACILLIN-TAZOBACTAM 3.375 G IVPB
3.3750 g | Freq: Three times a day (TID) | INTRAVENOUS | Status: DC
  Administered 2017-08-24 – 2017-08-29 (×14): 3.375 g via INTRAVENOUS
  Filled 2017-08-24 (×17): qty 50

## 2017-08-24 MED ORDER — ONDANSETRON HCL 4 MG PO TABS: 4.0000 mg | ORAL_TABLET | Freq: Four times a day (QID) | ORAL | Status: DC | PRN

## 2017-08-24 MED ORDER — HYDRALAZINE HCL 25 MG PO TABS: 25.0000 mg | ORAL_TABLET | Freq: Four times a day (QID) | ORAL | Status: DC | PRN

## 2017-08-24 MED ORDER — ONDANSETRON HCL 4 MG PO TABS
4.0000 mg | ORAL_TABLET | Freq: Once | ORAL | Status: AC
  Administered 2017-08-24: 4 mg via ORAL
  Filled 2017-08-24: qty 1

## 2017-08-24 MED ORDER — SODIUM CHLORIDE 0.9 % IV SOLN
1000.0000 mL | INTRAVENOUS | Status: DC
  Administered 2017-08-24: 1000 mL via INTRAVENOUS

## 2017-08-24 MED ORDER — ASPIRIN EC 81 MG PO TBEC
81.0000 mg | DELAYED_RELEASE_TABLET | Freq: Every day | ORAL | Status: DC
  Administered 2017-08-24 – 2017-09-01 (×8): 81 mg via ORAL
  Filled 2017-08-24 (×10): qty 1

## 2017-08-24 MED ORDER — ACETAMINOPHEN 650 MG RE SUPP: 650.0000 mg | Freq: Four times a day (QID) | RECTAL | Status: DC | PRN

## 2017-08-24 MED ORDER — SODIUM CHLORIDE 0.9 % IV SOLN
INTRAVENOUS | Status: DC
  Administered 2017-08-24 – 2017-08-28 (×2): via INTRAVENOUS

## 2017-08-24 MED ORDER — ASPIRIN 81 MG PO CHEW
324.0000 mg | CHEWABLE_TABLET | Freq: Once | ORAL | Status: AC
  Administered 2017-08-24: 324 mg via ORAL
  Filled 2017-08-24: qty 4

## 2017-08-24 MED ORDER — VANCOMYCIN HCL IN DEXTROSE 1-5 GM/200ML-% IV SOLN
1000.0000 mg | Freq: Two times a day (BID) | INTRAVENOUS | Status: DC
  Administered 2017-08-24 – 2017-08-25 (×2): 1000 mg via INTRAVENOUS
  Filled 2017-08-24: qty 200

## 2017-08-24 MED ORDER — INSULIN DETEMIR 100 UNIT/ML ~~LOC~~ SOLN
40.0000 [IU] | Freq: Every day | SUBCUTANEOUS | Status: DC
  Administered 2017-08-24 – 2017-08-29 (×6): 40 [IU] via SUBCUTANEOUS
  Filled 2017-08-24 (×6): qty 0.4

## 2017-08-24 MED ORDER — TRAMADOL HCL 50 MG PO TABS
50.0000 mg | ORAL_TABLET | Freq: Four times a day (QID) | ORAL | Status: DC | PRN
  Administered 2017-08-24 – 2017-08-31 (×9): 50 mg via ORAL
  Filled 2017-08-24 (×9): qty 1

## 2017-08-24 MED ORDER — SODIUM CHLORIDE 0.9 % IV BOLUS (SEPSIS)
1000.0000 mL | Freq: Once | INTRAVENOUS | Status: AC
  Administered 2017-08-24: 1000 mL via INTRAVENOUS

## 2017-08-24 MED ORDER — POLYETHYLENE GLYCOL 3350 17 G PO PACK
17.0000 g | PACK | Freq: Every day | ORAL | Status: DC | PRN
Start: 2017-08-24 — End: 2017-09-01

## 2017-08-24 MED ORDER — PIPERACILLIN-TAZOBACTAM 3.375 G IVPB 30 MIN
3.3750 g | Freq: Once | INTRAVENOUS | Status: AC
  Administered 2017-08-24: 3.375 g via INTRAVENOUS
  Filled 2017-08-24: qty 50

## 2017-08-24 MED ORDER — INSULIN ASPART 100 UNIT/ML ~~LOC~~ SOLN
10.0000 [IU] | Freq: Once | SUBCUTANEOUS | Status: AC
  Administered 2017-08-24: 10 [IU] via INTRAVENOUS
  Filled 2017-08-24: qty 1

## 2017-08-24 MED ORDER — ONDANSETRON HCL 4 MG/2ML IJ SOLN: 4.0000 mg | Freq: Four times a day (QID) | INTRAMUSCULAR | Status: DC | PRN

## 2017-08-24 NOTE — ED Notes (Signed)
CRITICAL VALUE ALERT  Critical Value:  Glucose 570  Date & Time Notied:  08/24/2017, 1250  Provider Notified: Loney Laurence PA  Orders Received/Actions taken:   See chart

## 2017-08-24 NOTE — ED Notes (Signed)
HB made aware of Bld sugar

## 2017-08-24 NOTE — ED Provider Notes (Signed)
[facility] EMERGENCY DEPARTMENT Provider Note   CSN: [id number] Arrival date & time: 08/24/17  1121     History   Chief Complaint Chief Complaint  Patient presents with  . Foot Pain    HPI Isabel [name] is a 52 y.o. female.   Patient is a 52 year old female who presents to the emergency department with a complaint of right foot pain.  The patient states that she is a diabetic.  She states she is not had insurance for several months, she has not been taking her insulin as directed trying to make it stretch out.  She is also not been to see a physician recently.  She states that several months ago she was told that she had a fungal infection of her toe on the right foot.  She informed the physician that she felt like she was developing an ulcer underneath the nail.  She was told however that because of her diabetic condition the only thing to do was to try and treat the fungus.  The patient states that after that she has not seen anyone and now she has an ulcer of the tip of the toe.  The nail of the great toe on the right is coming off, the toe is somewhat swollen and red.  The patient states she has a great deal of pain in her right foot.  She further states that she has had circulation problems in the left and she was told that it would only be a matter of time before she had the same situation in the right.  She has had a portion of the right foot surgically removed because of diabetic illness.  The patient also has coronary artery disease and has required coronary artery bypass graft surgery in the past.     Past Medical History:  Diagnosis Date  . Anxiety   . CHF (congestive heart failure) (HCC)   . Coronary artery disease    a. s/p CABG in 2015 with LIMA-LAD, seq reverse SVG-OM-dLCx, and reverse SVG-dRCA  . Diabetes mellitus without complication (HCC)    Type 2  . GERD (gastroesophageal reflux disease)   . History of kidney stones   . Peripheral vascular disease [facility])      Patient Active Problem List   Diagnosis Date Noted  . Atherosclerosis of native arteries of left leg with ulceration of other part of foot (HCC) 09/22/2015  . Post-operative pain 09/22/2015  . Pain of left lower extremity 09/22/2015  . Atherosclerosis of native arteries of the extremities with gangrene (HCC) 08/25/2015  . PAD (peripheral artery disease) (HCC) 08/03/2015  . Gangrene of toe (HCC) 07/26/2015  . Gangrene (HCC) 07/26/2015  . 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 Mild aortic stenosis 06/05/2013  . DM (diabetes mellitus), type 2, uncontrolled (HCC) 06/02/2013  . CAD (coronary artery disease) 06/02/2013  . Anemia 06/02/2013  . Constipation 06/02/2013    Past Surgical History:  Procedure Laterality Date  . AMPUTATION Left 08/18/2015   Procedure: Left Foot 4th and 5th Ray Amputation With [facility] Placement;  Surgeon: Nadara Mustard, MD;  Location: [facility] OR;  Service: Orthopedics;  Laterality: Left;  . AMPUTATION TOE Left 08/03/2015   Procedure: AMPUTATION LEFT FIFTH TOE;  Surgeon: Fransisco Hertz, MD;  Location: [facility] OR;  Service: Vascular;  Laterality: Left;  . BACK SURGERY    . CORONARY ANGIOPLASTY WITH STENT PLACEMENT    . CORONARY ARTERY BYPASS GRAFT N/A 06/08/2013   Procedure: CORONARY ARTERY BYPASS GRAFTING (  CABG) times four using left internal mammary and right saphenous vein.;  Surgeon: Delight Ovens, MD;  Location: [facility] OR;  Service: Open Heart Surgery;  Laterality: N/A;  . ENDARTERECTOMY FEMORAL Left 08/03/2015   Procedure: ENDARTERECTOMY LEFT ILIO-FEMORAL PROFUNDA;  Surgeon: Fransisco Hertz, MD;  Location: [facility] OR;  Service: Vascular;  Laterality: Left;  . FEMORAL-POPLITEAL BYPASS GRAFT Left 08/03/2015   Procedure: BYPASS GRAFT  LEFT FEMORAL TO  BELOW KNEE POPLITEAL ARTERY USING X 80CM GORE PROPATEN GRAFT;  Surgeon: Fransisco Hertz, MD;  Location: [facility] OR;  Service: Vascular;  Laterality: Left;  . INTRAOPERATIVE TRANSESOPHAGEAL ECHOCARDIOGRAM N/A 06/08/2013   Procedure: INTRAOPERATIVE  TRANSESOPHAGEAL ECHOCARDIOGRAM;  Surgeon: Delight Ovens, MD;  Location: [facility] OR;  Service: Open Heart Surgery;  Laterality: N/A;  . KIDNEY STONE SURGERY    . LEFT AND RIGHT HEART CATHETERIZATION WITH CORONARY ANGIOGRAM N/A 06/07/2013   Procedure: LEFT AND RIGHT HEART CATHETERIZATION WITH CORONARY ANGIOGRAM;  Surgeon: Marykay Lex, MD;  Location: [facility] CATH LAB;  Service: Cardiovascular;  Laterality: N/A;  . LOWER EXTREMITY ANGIOGRAM Bilateral 07/27/2015   Procedure: Lower Extremity Angiogram;  Surgeon: Chuck Hint, MD;  Location: [facility] INVASIVE CV LAB;  Service: Cardiovascular;  Laterality: Bilateral;  . PATCH ANGIOPLASTY Left 08/03/2015   Procedure: PATCH ANGIOPLASTY RIGHT ILIO-FEMORAL PROFUNDA USING XENOSURE BIOLOGIC PATCH;  Surgeon: Fransisco Hertz, MD;  Location: [facility] OR;  Service: Vascular;  Laterality: Left;  . PERIPHERAL VASCULAR CATHETERIZATION N/A 07/27/2015   Procedure: Abdominal Aortogram;  Surgeon: Chuck Hint, MD;  Location: [facility] INVASIVE CV LAB;  Service: Cardiovascular;  Laterality: N/A;  . SALPINGOOPHORECTOMY     "? side"  . TONSILLECTOMY    . TUBAL LIGATION       OB History   None      Home Medications    Prior to Admission medications   Medication Sig Start Date End Date Taking? Authorizing Provider  ALPRAZolam Prudy Feeler) 1 MG tablet Take 1 mg by mouth 4 (four) times daily as needed for anxiety.  05/06/13   [provider]  aspirin EC 81 MG tablet Take 81 mg by mouth daily.    [provider]  atorvastatin (LIPITOR) 80 MG tablet Take 1 tablet (80 mg total) by mouth daily at 6 PM. 06/18/13   Collins, Almira Coaster L, PA-C  carvedilol (COREG) 3.125 MG tablet Take 1 tablet (3.125 mg total) by mouth 2 (two) times daily. 11/30/13   Laqueta Linden, MD  furosemide (LASIX) 40 MG tablet Take 40 mg by mouth daily.  10/23/15   [provider]  HUMALOG KWIKPEN 100 UNIT/ML KiwkPen Inject 10 Units into the skin daily as needed. Per sliding scale. For high  sugar 10/22/13   [provider]  Insulin Detemir (LEVEMIR) 100 UNIT/ML Pen Inject 100 Units into the skin See admin instructions. Take 40 units in the morning, then take 20 units in the afternoon, then take 40 units again to equal out to 100units a day per patient 06/18/13   Coral Ceo L, PA-C  lisinopril (PRINIVIL,ZESTRIL) 5 MG tablet Take 1 tablet (5 mg total) by mouth daily. 06/18/13   Collins, Debby Freiberg, PA-C  omeprazole (PRILOSEC) 20 MG capsule Take 1 capsule by mouth 2 (two) times daily. 11/17/13   [provider]  polyethylene glycol (MIRALAX / GLYCOLAX) packet Take 17 g by mouth daily as needed for mild constipation.  06/18/13   Thomasena Edis, Debby Freiberg, PA-C  PROAIR HFA 108 (90 BASE) MCG/ACT inhaler Inhale 2  puffs into the lungs 4 (four) times daily as needed for shortness of breath.  01/09/15   [provider]  traMADol (ULTRAM) 50 MG tablet Take 50 mg by mouth every 6 (six) hours as needed.    [provider]    Family History Family History  Problem Relation Age of Onset  . Heart disease Mother   . Diabetes Mother   . Stroke Mother   . Cancer Father   . Heart disease Father   . Diabetes Father   . Heart disease Brother   . Diabetes Paternal Aunt   . Stroke Maternal Grandmother   . Diabetes Paternal Grandfather     Social History Social History   Tobacco Use  . Smoking status: Former Smoker    Packs/day: 0.50    Years: 10.00    Pack years: 5.00    Types: Cigarettes    Start date: 12/26/1992    Last attempt to quit: 04/15/2000    Years since quitting: 17.3  . Smokeless tobacco: Never Used  . Tobacco comment: QUIT SMOKING YEARS AGO "  Substance Use Topics  . Alcohol use: Yes    Alcohol/week: 0.0 oz    Comment: 07/27/2015 "might have a couple drinks twice/month"  . Drug use: No     Allergies   No known allergies   Review of Systems Review of Systems  Constitutional: Negative for activity change.       All ROS Neg except as noted in HPI   HENT: Negative for nosebleeds.   Eyes: Negative for photophobia and discharge.  Respiratory: Negative for cough, shortness of breath and wheezing.   Cardiovascular: Negative for chest pain and palpitations.  Gastrointestinal: Negative for abdominal pain and blood in stool.  Genitourinary: Negative for dysuria, frequency and hematuria.  Musculoskeletal: Positive for arthralgias. Negative for back pain and neck pain.       Foot pain  Skin: Negative.   Neurological: Negative for dizziness, seizures and speech difficulty.  Psychiatric/Behavioral: Negative for confusion and hallucinations.     Physical Exam Updated Vital Signs BP (!) 171/79 (BP Location: Right Arm) Comment: pt states no insurance and is out of BP meds  Pulse 95   Temp 98.4 F (36.9 C) (Oral)   Resp 18   Ht 5\' 1"  (1.549 m)   Wt 70.8 kg (156 lb)   SpO2 100%   BMI 29.48 kg/m    Physical Exam  Constitutional: She is oriented to person, place, and time. She appears well-developed and well-nourished.  Non-toxic appearance.  HENT:  Head: Normocephalic.  Right Ear: Tympanic membrane and external ear normal.  Left Ear: Tympanic membrane and external ear normal.  Eyes: Pupils are equal, round, and reactive to light. EOM and lids are normal.  Neck: Normal range of motion. Neck supple. Carotid bruit is not present.  Cardiovascular: Regular rhythm, normal heart sounds, intact distal pulses and normal pulses. Tachycardia present.  Pulmonary/Chest: Breath sounds normal. No respiratory distress.  Abdominal: Soft. Bowel sounds are normal. There is no tenderness. There is no guarding.  Musculoskeletal: She exhibits tenderness.  The patient has had the left fourth and fifth toes surgically removed.  The dorsalis pedis pulse is 1+.  I cannot palpate a dorsalis pedis pulse on the right.  There is an ulcer of the medial aspect of the distal first toe.  There is a black scar present.  The nail is partially removed.  There is swelling  of the toe and some increased redness present.  The foot is cool, but not cold.  There are no red streaks going up the foot.  Lymphadenopathy:       Head (right side): No submandibular adenopathy present.       Head (left side): No submandibular adenopathy present.    She has no cervical adenopathy.  Neurological: She is alert and oriented to person, place, and time. She has normal strength. No cranial nerve deficit or sensory deficit.  Skin: Skin is warm and dry.  Psychiatric: She has a normal mood and affect. Her speech is normal.  Nursing note and vitals reviewed.        ED Treatments / Results  Labs (all labs ordered are listed, but only abnormal results are displayed) Labs Reviewed  CBG MONITORING, ED - Abnormal; Notable for the following components:      Result Value   Glucose-Capillary 526 (*)    All other components within normal limits  CBC  BASIC METABOLIC PANEL    EKG None  Radiology No results found.  Procedures Procedures (including critical care time)  Medications Ordered in ED Medications - No data to display   Initial Impression / Assessment and Plan / ED Course  I have reviewed the triage vital signs and the nursing notes.  Pertinent labs & imaging results that were available during my care of the patient were reviewed by me and considered in my medical decision making (see chart for details).       Final Clinical Impressions(s) / ED Diagnoses MDM  Vital signs reviewed.  Pulse oximetry is 100% on room air.  It is of note the patient has a mild tachycardia of 102.  The blood pressure is elevated at 171/89.  CBG was elevated at 526.  Given the elevation in the glucose, as well as the possibility of osteomyelitis involving the foot, the patient was moved from the fast-track area to the acute area.  Patient had an x-ray of the great toe which questioned an irregularity of the distal tuft of and some soft tissue changes, possibly consistent with  some osteomyelitis.  Glucose 570 on bmet. Case reviewed with Dr Hyacinth Meeker.  CBC wnl.  IV zosyn given to the patient. Pain medication given to the patient. Call placed to [facility]ad hospitalist. Pt to be admitted to the hospital     Final diagnoses:  Cellulitis    ED Discharge Orders    None      Ivery Quale, PA-C 08/24/17 1530  Eber Hong, MD 09/02/17 1153

## 2017-08-24 NOTE — ED Notes (Signed)
Unable to doppler dorsalis pedis  in right foot.   Was able to doppler posterior tibial and peroneal pulse.

## 2017-08-24 NOTE — ED Triage Notes (Signed)
Pt states has fungus under toe nail and now ulcer like infection on outside of right great toe.

## 2017-08-24 NOTE — H&P (Signed)
TRH H&P    Patient Demographics:    Isabel [name], is a 52 y.o. female  MRN: [id number]  DOB - 13-Dec-1965  Admit Date - 08/24/2017  Referring MD/NP/PA: Gates Rigg  Outpatient Primary MD for the patient is [name], MD  Patient coming from: Home  Chief complaint-foot pain   HPI:    Isabel [name]  is a 52 y.o. female, with history of uncontrolled diabetes mellitus, not taking insulin due to lack of insurance, CAD status post CABG x4, peripheral vascular disease status post iliofemoral endarterectomy, left common femoral to below-the-knee popliteal artery bypass, who came to hospital with worsening right foot pain.  Patient developed right big toe ulcer which has been turning blackish for past few days.  Patient also noticed pain.  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 She denies fever or chills. No nausea vomiting or diarrhea. Denies chest pain or shortness of breath. Denies dysuria, urgency or frequency of urination.  In the ED, x-ray of the foot showed possibility of big toe osteomyelitis. Patient started on vancomycin and Zosyn.    Review of systems:      All other systems reviewed and are negative.   With Past History of the following :    Past Medical History:  Diagnosis Date  . Anxiety   . CHF (congestive heart failure) (HCC)   . Coronary artery disease    a. s/p CABG in 2015 with LIMA-LAD, seq reverse SVG-OM-dLCx, and reverse SVG-dRCA  . Diabetes mellitus without complication (HCC)    Type 2  . GERD (gastroesophageal reflux disease)   . History of kidney stones   . Peripheral vascular disease [facility])       Past Surgical History:  Procedure Laterality Date  . AMPUTATION Left 08/18/2015   Procedure: Left Foot 4th and 5th Ray Amputation With [facility] Placement;  Surgeon: Nadara Mustard, MD;  Location: [facility] OR;  Service:  Orthopedics;  Laterality: Left;  . AMPUTATION TOE Left 08/03/2015   Procedure: AMPUTATION LEFT FIFTH TOE;  Surgeon: Fransisco Hertz, MD;  Location: [facility] OR;  Service: Vascular;  Laterality: Left;  . BACK SURGERY    . CORONARY ANGIOPLASTY WITH STENT PLACEMENT    . CORONARY ARTERY BYPASS GRAFT N/A 06/08/2013   Procedure: CORONARY ARTERY BYPASS GRAFTING (CABG) times four using left internal mammary and right saphenous vein.;  Surgeon: Delight Ovens, MD;  Location: [facility] OR;  Service: Open Heart Surgery;  Laterality: N/A;  . ENDARTERECTOMY FEMORAL Left 08/03/2015   Procedure: ENDARTERECTOMY LEFT ILIO-FEMORAL PROFUNDA;  Surgeon: Fransisco Hertz, MD;  Location: [facility] OR;  Service: Vascular;  Laterality: Left;  . FEMORAL-POPLITEAL BYPASS GRAFT Left 08/03/2015   Procedure: BYPASS GRAFT  LEFT FEMORAL TO  BELOW KNEE POPLITEAL ARTERY USING X 80CM GORE PROPATEN GRAFT;  Surgeon: Fransisco Hertz, MD;  Location: [facility] OR;  Service: Vascular;  Laterality: Left;  . INTRAOPERATIVE TRANSESOPHAGEAL ECHOCARDIOGRAM N/A 06/08/2013   Procedure: INTRAOPERATIVE TRANSESOPHAGEAL ECHOCARDIOGRAM;  Surgeon: Ramon Dredge  Bari Edward, MD;  Location: [facility] OR;  Service: Open Heart Surgery;  Laterality: N/A;  . KIDNEY STONE SURGERY    . LEFT AND RIGHT HEART CATHETERIZATION WITH CORONARY ANGIOGRAM N/A 06/07/2013   Procedure: LEFT AND RIGHT HEART CATHETERIZATION WITH CORONARY ANGIOGRAM;  Surgeon: Marykay Lex, MD;  Location: [facility] CATH LAB;  Service: Cardiovascular;  Laterality: N/A;  . LOWER EXTREMITY ANGIOGRAM Bilateral 07/27/2015   Procedure: Lower Extremity Angiogram;  Surgeon: Chuck Hint, MD;  Location: [facility] INVASIVE CV LAB;  Service: Cardiovascular;  Laterality: Bilateral;  . PATCH ANGIOPLASTY Left 08/03/2015   Procedure: PATCH ANGIOPLASTY RIGHT ILIO-FEMORAL PROFUNDA USING XENOSURE BIOLOGIC PATCH;  Surgeon: Fransisco Hertz, MD;  Location: [facility] OR;  Service: Vascular;  Laterality: Left;  . PERIPHERAL VASCULAR CATHETERIZATION N/A 07/27/2015   Procedure:  Abdominal Aortogram;  Surgeon: Chuck Hint, MD;  Location: [facility] INVASIVE CV LAB;  Service: Cardiovascular;  Laterality: N/A;  . SALPINGOOPHORECTOMY     "? side"  . TONSILLECTOMY    . TUBAL LIGATION        Social History:      Social History   Tobacco Use  . Smoking status: Former Smoker    Packs/day: 0.50    Years: 10.00    Pack years: 5.00    Types: Cigarettes    Start date: 12/26/1992    Last attempt to quit: 04/15/2000    Years since quitting: 17.3  . Smokeless tobacco: Never Used  . Tobacco comment: QUIT SMOKING YEARS AGO "  Substance Use Topics  . Alcohol use: Yes    Alcohol/week: 0.0 oz    Comment: 07/27/2015 "might have a couple drinks twice/month"       Family History :     Family History  Problem Relation Age of Onset  . Heart disease Mother   . Diabetes Mother   . Stroke Mother   . Cancer Father   . Heart disease Father   . Diabetes Father   . Heart disease Brother   . Diabetes Paternal Aunt   . Stroke Maternal Grandmother   . Diabetes Paternal Grandfather       Home Medications:   Prior to Admission medications   Medication Sig Start Date End Date Taking? Authorizing Provider  ALPRAZolam Prudy Feeler) 1 MG tablet Take 1 mg by mouth 4 (four) times daily as needed for anxiety.  05/06/13   [provider]  aspirin EC 81 MG tablet Take 81 mg by mouth daily.    [provider]  atorvastatin (LIPITOR) 80 MG tablet Take 1 tablet (80 mg total) by mouth daily at 6 PM. 06/18/13   Collins, Almira Coaster L, PA-C  carvedilol (COREG) 3.125 MG tablet Take 1 tablet (3.125 mg total) by mouth 2 (two) times daily. 11/30/13   Laqueta Linden, MD  furosemide (LASIX) 40 MG tablet Take 40 mg by mouth daily.  10/23/15   [provider]  HUMALOG KWIKPEN 100 UNIT/ML KiwkPen Inject 10 Units into the skin daily as needed. Per sliding scale. For high sugar 10/22/13   [provider]  Insulin Detemir (LEVEMIR) 100 UNIT/ML Pen Inject 100 Units into the  skin See admin instructions. Take 40 units in the morning, then take 20 units in the afternoon, then take 40 units again to equal out to 100units a day per patient 06/18/13   Coral Ceo L, PA-C  lisinopril (PRINIVIL,ZESTRIL) 5 MG tablet Take 1 tablet (5 mg total) by mouth daily. 06/18/13   Wilmon Pali, PA-C  omeprazole (PRILOSEC) 20 MG capsule Take 1 capsule by mouth 2 (two) times daily. 11/17/13   [provider]  polyethylene glycol (MIRALAX / GLYCOLAX) packet Take 17 g by mouth daily as needed for mild constipation.  06/18/13   Collins, Debby Freiberg, PA-C  PROAIR HFA 108 (90 BASE) MCG/ACT inhaler Inhale 2 puffs into the lungs 4 (four) times daily as needed for shortness of breath.  01/09/15   [provider]  traMADol (ULTRAM) 50 MG tablet Take 50 mg by mouth every 6 (six) hours as needed.    [provider]     Allergies:     Allergies  Allergen Reactions  . No Known Allergies      Physical Exam:   Vitals  Blood pressure (!) 187/88, pulse 93, temperature 98.4 F (36.9 C), temperature source Oral, resp. rate 18, height 5\' 1"  (1.549 m), weight 70.8 kg (156 lb), SpO2 100 %.  1.  General: Appears in no acute distress  2. Psychiatric:  Intact judgement and  insight, awake alert, oriented x 3.  3. Neurologic: No focal neurological deficits, all cranial nerves intact.Strength 5/5 all 4 extremities, sensation intact all 4 extremities, plantars down going.  4. Eyes :  anicteric sclerae, moist conjunctivae with no lid lag. PERRLA.  5. ENMT:  Oropharynx clear with moist mucous membranes and good dentition  6. Neck:  supple, no cervical lymphadenopathy appriciated, No thyromegaly  7. Respiratory : Normal respiratory effort, good air movement bilaterally,clear to  auscultation bilaterally  8. Cardiovascular : RRR, no gallops, rubs or murmurs, no leg edema  9. Gastrointestinal:  Positive bowel sounds, abdomen soft, non-tender to palpation,no  hepatosplenomegaly, no rigidity or guarding       10. Skin:        Right foot-peripheral pulses are not palpable, mild erythema noted around the big toe    Data Review:    CBC Recent Labs  Lab 08/24/17 1152  WBC 6.4  HGB 12.3  HCT 36.7  PLT 208  MCV 88.4  MCH 29.6  MCHC 33.5  RDW 12.6   ------------------------------------------------------------------------------------------------------------------  Chemistries  Recent Labs  Lab 08/24/17 1152  NA 132*  K 4.2  CL 96*  CO2 27  GLUCOSE 570*  BUN 18  CREATININE 0.91  CALCIUM 9.4   ------------------------------------------------------------------------------------------------------------------  ------------------------------------------------------------------------------------------------------------------ GFR: Estimated Creatinine Clearance: 65.1 mL/min (by C-G formula based on SCr of 0.91 mg/dL). Liver Function Tests: No results for input(s): AST, ALT, ALKPHOS, BILITOT, PROT, ALBUMIN in the last 168 hours. No results for input(s): LIPASE, AMYLASE in the last 168 hours. No results for input(s): AMMONIA in the last 168 hours. Coagulation Profile: No results for input(s): INR, PROTIME in the last 168 hours. Cardiac Enzymes: No results for input(s): CKTOTAL, CKMB, CKMBINDEX, TROPONINI in the last 168 hours. BNP (last 3 results) No results for input(s): PROBNP in the last 8760 hours. HbA1C: No results for input(s): HGBA1C in the last 72 hours. CBG: Recent Labs  Lab 08/24/17 1145 08/24/17 1417  GLUCAP 526* 337*   Lipid Profile: No results for input(s): CHOL, HDL, LDLCALC, TRIG, CHOLHDL, LDLDIRECT in the last 72 hours. Thyroid Function Tests: No results for input(s): TSH, T4TOTAL, FREET4, T3FREE, THYROIDAB in the last 72 hours. Anemia Panel: No results for input(s): VITAMINB12, FOLATE, FERRITIN, TIBC, IRON, RETICCTPCT in the last 72 hours.     Imaging Results:    Dg Toe Great Right  Result Date:  08/24/2017 CLINICAL DATA:  Cellulitis of the right great toe. EXAM: RIGHT GREAT  TOE COMPARISON:  None. FINDINGS: There is no evidence of fracture or dislocation. There is mild osteopenia with slight cortical irregularity of the distal tuft of the right first distal phalanx. There is soft tissue swelling of the distal right great toe. IMPRESSION: Mild osteopenia with slight cortical irregularity of the distal tuft of the right first distal phalanx, suspicious for underlying osteomyelitis. Electronically Signed   By: Sherian Rein M.D.   On: 08/24/2017 13:14       Assessment & Plan:    Active Problems:   DM (diabetes mellitus), type 2, uncontrolled (HCC)   CAD (coronary artery disease)   S/P CABG x 4   PAD (peripheral artery disease) (HCC)   Diabetic foot ulcer (HCC)   1. Diabetic foot ulcer/?  Osteomyelitis-we will start patient on vancomycin and Zosyn per pharmacy, consult general surgery in a.m.  We will also obtain vascular arterial Doppler ABI in a.m. 2. Diabetes mellitus, uncontrolled-patient ran out of her insulin and has not been using insulin.  Start Levemir 40 units subcu at bedtime, will also start moderate sliding scale insulin with NovoLog. 3. Peripheral vascular disease-patient is not taking aspirin, will start aspirin 81 mg po daily.  Continue Lipitor 4. CAD, status post CABG x4-stable, continue aspirin, Coreg, Lipitor hypertension 5. Hypertension-continue lisinopril, Coreg will hold Lasix at this time.    DVT Prophylaxis-   Lovenox   AM Labs Ordered, also please review Full Orders  Family Communication: Admission, patients condition and plan of care including tests being ordered have been discussed with the patient and her husband at bedside who indicate understanding and agree with the plan and Code Status.  Code Status: Full code  Admission status: Inpatient  Time spent in minutes : 60 minutes   Meredeth Ide M.D on 08/24/2017 at 3:53 PM  Between 7am to 7pm - Pager  - [phone]. After 7pm go to [email or web address] - password [facility]  [facility] - Office  [phone]

## 2017-08-24 NOTE — ED Notes (Signed)
Pt reports R toe wound for 6 weeks  Reports no insurance-  Given good rx card as well as infor of free med care sheets  Wound to R great toe laterally scabbed and intrusive to great toenail

## 2017-08-24 NOTE — ED Notes (Addendum)
Pt reports she has n ot had her insulin and cannot afford the 49.00 to buy regular insulin at [facility]  She also reports that she took her short acting t his am but has no long acting insulin

## 2017-08-24 NOTE — ED Provider Notes (Signed)
Medical screening examination/treatment/procedure(s) were conducted as a shared visit with non-physician practitioner(s) and myself.  I personally evaluated the patient during the encounter.  Clinical Impression:   Final diagnoses:  Cellulitis    Pt has had pain in the R great toe with swelling and some black eschar - with uncontrolled DM due to lack of meds -trying to spread out her insulin to make it last, has been more thirsty and urinating more frequently  On exam she does not fact have a black eschar on the right great toe medial surface, evidence of fungal infection of the nail and some tenderness.  Capillary refill is normal to the right foot the pulses are weaker.  Abdomen soft, heart and lung sounds are normal, no tachycardia  Labs show hyperglycemia without DKA, x-ray shows some osteomyelitis of the right great toe  Antibiotics and admission, will need social work to help arrange outpatient medications so that the patient can get better diabetic control.   Eber Hong, MD 09/02/17 1153

## 2017-08-24 NOTE — Progress Notes (Signed)
Pharmacy Antibiotic Note  Isabel [name] is a 52 y.o. female admitted on 08/24/2017 with cellulitis and wound infection (possible diabetic foot). .  Pharmacy has been consulted for Vancomycin and Zosyn dosing.  Initial Zosyn given in ED.  Plan: Vancomycin 1gm IV every 12 hours.  Goal trough 15-20 mcg/mL.  Zosyn 3.375gm IV every 8 hours. Follow-up micro data, labs, vitals.   Height: 5\' 1"  (154.9 cm) Weight: 156 lb (70.8 kg) IBW/kg (Calculated) : 47.8  Temp (24hrs), Avg:98.4 F (36.9 C), Min:98.4 F (36.9 C), Max:98.4 F (36.9 C)  Recent Labs  Lab 08/24/17 1152  WBC 6.4  CREATININE 0.91    Estimated Creatinine Clearance: 65.1 mL/min (by C-G formula based on SCr of 0.91 mg/dL).    Allergies  Allergen Reactions  . No Known Allergies     Antimicrobials this admission: Vanc 5/12 >>  Zosyn 5/12 >>   Dose adjustments this admission: n/a   Microbiology results:  BCx:   UCx:    Sputum:    MRSA PCR:   Thank you for allowing pharmacy to be a part of this patient's care.  Mady Gemma 08/24/2017 4:28 PM

## 2017-08-25 ENCOUNTER — Inpatient Hospital Stay (HOSPITAL_COMMUNITY): Payer: Self-pay

## 2017-08-25 DIAGNOSIS — E1165 Type 2 diabetes mellitus with hyperglycemia: Secondary | ICD-10-CM

## 2017-08-25 DIAGNOSIS — I739 Peripheral vascular disease, unspecified: Secondary | ICD-10-CM

## 2017-08-25 DIAGNOSIS — L97519 Non-pressure chronic ulcer of other part of right foot with unspecified severity: Secondary | ICD-10-CM

## 2017-08-25 DIAGNOSIS — E08621 Diabetes mellitus due to underlying condition with foot ulcer: Secondary | ICD-10-CM

## 2017-08-25 LAB — COMPREHENSIVE METABOLIC PANEL
ALK PHOS: 68 U/L (ref 38–126)
ALT: 12 U/L — AB (ref 14–54)
AST: 11 U/L — ABNORMAL LOW (ref 15–41)
Albumin: 2 g/dL — ABNORMAL LOW (ref 3.5–5.0)
Anion gap: 3 — ABNORMAL LOW (ref 5–15)
BILIRUBIN TOTAL: 0.6 mg/dL (ref 0.3–1.2)
BUN: 22 mg/dL — ABNORMAL HIGH (ref 6–20)
CALCIUM: 8.8 mg/dL — AB (ref 8.9–10.3)
CO2: 28 mmol/L (ref 22–32)
CREATININE: 1.28 mg/dL — AB (ref 0.44–1.00)
Chloride: 104 mmol/L (ref 101–111)
GFR calc non Af Amer: 47 mL/min — ABNORMAL LOW (ref >60)
GFR, EST AFRICAN AMERICAN: 55 mL/min — AB (ref >60)
Glucose, Bld: 199 mg/dL — ABNORMAL HIGH (ref 65–99)
Potassium: 3.8 mmol/L (ref 3.5–5.1)
Sodium: 135 mmol/L (ref 135–145)
Total Protein: 5.3 g/dL — ABNORMAL LOW (ref 6.5–8.1)

## 2017-08-25 LAB — GLUCOSE, CAPILLARY
GLUCOSE-CAPILLARY: 240 mg/dL — AB (ref 65–99)
Glucose-Capillary: 171 mg/dL — ABNORMAL HIGH (ref 65–99)
Glucose-Capillary: 169 mg/dL — ABNORMAL HIGH (ref 65–99)
Glucose-Capillary: 157 mg/dL — ABNORMAL HIGH (ref 65–99)

## 2017-08-25 LAB — CBC
HEMATOCRIT: 32.9 % — AB (ref 36.0–46.0)
HEMOGLOBIN: 10.9 g/dL — AB (ref 12.0–15.0)
MCH: 29.2 pg (ref 26.0–34.0)
MCHC: 33.1 g/dL (ref 30.0–36.0)
MCV: 88.2 fL (ref 78.0–100.0)
Platelets: 189 10*3/uL (ref 150–400)
RBC: 3.73 MIL/uL — ABNORMAL LOW (ref 3.87–5.11)
RDW: 12.5 % (ref 11.5–15.5)
WBC: 6.5 10*3/uL (ref 4.0–10.5)

## 2017-08-25 MED ORDER — LISINOPRIL 10 MG PO TABS
10.0000 mg | ORAL_TABLET | Freq: Every day | ORAL | Status: DC
  Administered 2017-08-26 – 2017-08-29 (×4): 10 mg via ORAL
  Filled 2017-08-25 (×4): qty 1

## 2017-08-25 MED ORDER — VANCOMYCIN HCL 500 MG IV SOLR
500.0000 mg | Freq: Two times a day (BID) | INTRAVENOUS | Status: DC
  Administered 2017-08-25 – 2017-08-28 (×7): 500 mg via INTRAVENOUS
  Filled 2017-08-25 (×8): qty 500

## 2017-08-25 NOTE — Progress Notes (Signed)
PROGRESS NOTE    Isabel [name]  SEL:[id number] DOB: [birth year] DOA: 08/24/2017 PCP: [name], MD    Brief Narrative:  52 year old female with a history of diabetes, hypertension, known peripheral vascular disease status post left femoropopliteal bypass and iliofemoral endarterectomy, presents with right great toe ulcer with possible underlying osteomyelitis.  Patient admitted to the hospital and started on intravenous antibiotics.  ABIs noted to be 0.4 on the right.  Case discussed with vascular surgery who recommended transfer to [facility] for arteriogram.   Assessment & Plan:   Active Problems:   DM (diabetes mellitus), type 2, uncontrolled (HCC)   CAD (coronary artery disease)   S/P CABG x 4   PVD (peripheral vascular disease) (HCC)   Diabetic foot ulcer (HCC)   1. Right great toe osteomyelitis with underlying peripheral vascular disease.  Patient last had arteriogram with left iliofemoral endarterectomy and left femoropopliteal bypass.  She does complain of claudication symptoms in her lower extremities.  Ulcer on right great toe has been present for approximately a week.  Plain films indicate possible underlying osteomyelitis.  She is been started on IV antibiotics.  Vascular studies performed showed ABI of 0.4 on right.  Case discussed with Dr. Arbie Cookey on-call for vascular surgery who recommended transfer to [facility] for likely arteriogram. 2. Diabetes.  Blood sugars are currently stable.  Continue on Levemir and sliding scale insulin. 3. Hypertension.  Continue on carvedilol and lisinopril.  Blood pressures running high.  Will increase lisinopril to 10 mg daily. 4. Hyperlipidemia.  Continue statin   DVT prophylaxis: Lovenox Code Status: Full code Family Communication: Discussed with husband at the bedside Disposition Plan: Transfer to [facility] for further evaluation   Consultants:   General surgery  Vascular surgery  Procedures:      Antimicrobials:   Vancomycin 5/12 >  Zosyn 5/12 >   Subjective: Does not have any pain in her right great toe.  No other complaints  Objective: Vitals:   08/24/17 1732 08/24/17 2207 08/25/17 0606 08/25/17 0613  BP: (!) 171/78 (!) 147/81  (!) 155/80  Pulse: 78 76  66  Resp: 14 15  16   Temp: 98.3 F (36.8 C) 98 F (36.7 C)  97.7 F (36.5 C)  TempSrc: Oral Oral  Oral  SpO2: 100% 97%  100%  Weight:   69.2 kg (152 lb 8.9 oz)   Height:        Intake/Output Summary (Last 24 hours) at 08/25/2017 1701 Last data filed at 08/25/2017 1511 Gross per 24 hour  Intake 1212.17 ml  Output 500 ml  Net 712.17 ml   Filed Weights   08/24/17 1127 08/25/17 0606  Weight: 70.8 kg (156 lb) 69.2 kg (152 lb 8.9 oz)    Examination:  General exam: Appears calm and comfortable  Respiratory system: Clear to auscultation. Respiratory effort normal. Cardiovascular system: S1 & S2 heard, RRR. No JVD, murmurs, rubs, gallops or clicks. No pedal edema. Gastrointestinal system: Abdomen is nondistended, soft and nontender. No organomegaly or masses felt. Normal bowel sounds heard. Central nervous system: Alert and oriented. No focal neurological deficits. Extremities: Symmetric 5 x 5 power.  Pedal pulses are not palpable, foot is warm to touch Skin: Right great toe with ulceration at the tip and eschar noted at toenail bed.  Surrounding edema and mild erythema Psychiatry: Judgement and insight appear normal. Mood & affect appropriate.     Data Reviewed: I have personally reviewed following labs and imaging studies  CBC: Recent Labs  Lab 08/24/17 1152 08/25/17 0542  WBC 6.4 6.5  HGB 12.3 10.9*  HCT 36.7 32.9*  MCV 88.4 88.2  PLT 208 189   Basic Metabolic Panel: Recent Labs  Lab 08/24/17 1152 08/25/17 0542  NA 132* 135  K 4.2 3.8  CL 96* 104  CO2 27 28  GLUCOSE 570* 199*  BUN 18 22*  CREATININE 0.91 1.28*  CALCIUM 9.4 8.8*   GFR: Estimated Creatinine Clearance: 45.8 mL/min  (A) (by C-G formula based on SCr of 1.28 mg/dL (H)). Liver Function Tests: Recent Labs  Lab 08/25/17 0542  AST 11*  ALT 12*  ALKPHOS 68  BILITOT 0.6  PROT 5.3*  ALBUMIN 2.0*   No results for input(s): LIPASE, AMYLASE in the last 168 hours. No results for input(s): AMMONIA in the last 168 hours. Coagulation Profile: No results for input(s): INR, PROTIME in the last 168 hours. Cardiac Enzymes: No results for input(s): CKTOTAL, CKMB, CKMBINDEX, TROPONINI in the last 168 hours. BNP (last 3 results) No results for input(s): PROBNP in the last 8760 hours. HbA1C: No results for input(s): HGBA1C in the last 72 hours. CBG: Recent Labs  Lab 08/24/17 1145 08/24/17 1417 08/24/17 2205 08/25/17 0800 08/25/17 1202  GLUCAP 526* 337* 352* 171* 169*   Lipid Profile: No results for input(s): CHOL, HDL, LDLCALC, TRIG, CHOLHDL, LDLDIRECT in the last 72 hours. Thyroid Function Tests: No results for input(s): TSH, T4TOTAL, FREET4, T3FREE, THYROIDAB in the last 72 hours. Anemia Panel: No results for input(s): VITAMINB12, FOLATE, FERRITIN, TIBC, IRON, RETICCTPCT in the last 72 hours. Sepsis Labs: No results for input(s): PROCALCITON, LATICACIDVEN in the last 168 hours.  No results found for this or any previous visit (from the past 240 hour(s)).       Radiology Studies: US Arterial Abi (screening Lower Extremity)  Result Date: 08/25/2017 CLINICAL DATA:  Right great toe ulceration. Right foot pain. Previous left toe amputations. Previous left iliofemoral endarterectomy and fem-pop bypass graft. Diabetes. EXAM: NONINVASIVE PHYSIOLOGIC VASCULAR STUDY OF BILATERAL LOWER EXTREMITIES TECHNIQUE: Evaluation of both lower extremities were performed at rest, including calculation of ankle-brachial indices with single level Doppler, pressure and pulse volume recording. COMPARISON:  07/20/2015 FINDINGS: Right ABI:  0.42 (previously 0.87) Left ABI:  0.95 (previously 0.42) Right Lower Extremity:   Monophasic distal arterial waveforms. Left Lower Extremity:  Biphasic distal arterial waveforms. IMPRESSION: 1. Severe progressive arterial occlusive disease in the right lower extremity. 2. Interval improvement in left lower extremity perfusion to near normal at rest. Electronically Signed   By: Corlis Leak M.D.   On: 08/25/2017 13:56   Dg Toe Great Right  Result Date: 08/24/2017 CLINICAL DATA:  Cellulitis of the right great toe. EXAM: RIGHT GREAT TOE COMPARISON:  None. FINDINGS: There is no evidence of fracture or dislocation. There is mild osteopenia with slight cortical irregularity of the distal tuft of the right first distal phalanx. There is soft tissue swelling of the distal right great toe. IMPRESSION: Mild osteopenia with slight cortical irregularity of the distal tuft of the right first distal phalanx, suspicious for underlying osteomyelitis. Electronically Signed   By: Sherian Rein M.D.   On: 08/24/2017 13:14        Scheduled Meds: . aspirin EC  81 mg Oral Daily  . atorvastatin  80 mg Oral q1800  . carvedilol  3.125 mg Oral BID WC  . enoxaparin (LOVENOX) injection  40 mg Subcutaneous Q24H  . insulin aspart  0-15 Units Subcutaneous TID WC  .  insulin detemir  40 Units Subcutaneous QHS  . lisinopril  5 mg Oral Daily  . pantoprazole  40 mg Oral Daily   Continuous Infusions: . sodium chloride 10 mL/hr at 08/24/17 1758  . piperacillin-tazobactam (ZOSYN)  IV 3.375 g (08/25/17 1252)  . vancomycin       LOS: 1 day    Time spent: Greater than 50% of this time spent in direct contact with patient discussing prior history and work up, vascular work up, transfer to [employer] cone    Erick Blinks, MD [facility] Pager [phone]  If 7PM-7AM, please contact night-coverage [email or web address] Password [email or web address] 08/25/2017, 5:01 PM

## 2017-08-25 NOTE — Progress Notes (Signed)
Pharmacy Antibiotic Note  Isabel [name] is a 52 y.o. female admitted on 08/24/2017 with cellulitis and wound infection (possible diabetic foot). .  Pharmacy has been consulted for Vancomycin and Zosyn dosing. CrCl worsered , will adjust antibiotics  Plan:  Decrease Vancomycin 500mg  IV every 12 hours.  Goal trough 15-20 mcg/mL. Zosyn 3.375gm IV every 8 hours. Follow-up micro data, labs, vitals.   Height: 5\' 1"  (154.9 cm) Weight: 152 lb 8.9 oz (69.2 kg) IBW/kg (Calculated) : 47.8  Temp (24hrs), Avg:98.1 F (36.7 C), Min:97.7 F (36.5 C), Max:98.4 F (36.9 C)  Recent Labs  Lab 08/24/17 1152 08/25/17 0542  WBC 6.4 6.5  CREATININE 0.91 1.28*    Estimated Creatinine Clearance: 45.8 mL/min (A) (by C-G formula based on SCr of 1.28 mg/dL (H)).    Allergies  Allergen Reactions  . No Known Allergies     Antimicrobials this admission: Vanc 5/12 >>  Zosyn 5/12 >>   Dose adjustments this admission: 5/13 Decrease Vancomycin 500mg  IV every 12 hours.  Microbiology results: no cultures as of now  BCx:   UCx:    Sputum:    MRSA PCR:   Thank you for allowing pharmacy to be a part of this patient's care.  Elder Cyphers, BS Pharm D, [place] Clinical Pharmacist Pager [phone] 08/25/2017 11:20 AM

## 2017-08-25 NOTE — Clinical Social Work Note (Signed)
CSW consulted for patient's medication needs. This is a CM function. CSW notified CM and need with be addressed as resources allow.  Please reconsult if new CSW need arises.   LCSW signing off.    Kalayah Leske, Juleen China, LCSW

## 2017-08-25 NOTE — Consult Note (Signed)
[facility] Consult  Reason for Consult: Right great toe ulcer  Referring Physician:  Dr. Roderic Palau   Chief Complaint    Foot Pain      Isabel [name] is a 52 y.o. female.  HPI: Ms. Windholz is a 52 yo with poorly controlled diabetes, not on insulin due to lack of insurance, who has a prior history of left leg revascularization with Dr. 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 Sharol Given. She reports this area on the right toe started with "fungus under the nail." She had been trying to keep the area clean and trimmed, and has been trying to get medicaid. She saw Dr. Legrand Rams (she believes) in the past month regarding her medicaid, but still has not heard back regarding her coverage. He told her to keep this area clean in the meantime. She feels that the area has gotten worse in the last few weeks/ days, and might of drained some liquid.   During her prior revascularization, a right arteriogram demonstrated moderate disease in the posterior tibial and an occluded anterior tibial.  She describes pain in the right leg with ambulation, the pain is down her entire leg and is burning/ cramping in nature.   Past Medical History:  Diagnosis Date  . Anxiety   . CHF (congestive heart failure) ([place])   . Coronary artery disease    a. s/p CABG in 2015 with LIMA-LAD, seq reverse SVG-OM-dLCx, and reverse SVG-dRCA  . Diabetes mellitus without complication (HCC)    Type 2  . GERD (gastroesophageal reflux disease)   . History of kidney stones   . Peripheral vascular disease [facility])     Past Surgical History:  Procedure Laterality Date  . AMPUTATION Left 08/18/2015   Procedure: Left Foot 4th and 5th Ray Amputation With [facility] Placement;  Surgeon: Newt Minion, MD;  Location: [place];  Service: Orthopedics;  Laterality: Left;  . AMPUTATION TOE Left 08/03/2015   Procedure: AMPUTATION LEFT FIFTH TOE;  Surgeon: Conrad Dulac, MD;  Location: [place];  Service: Vascular;  Laterality:  Left;  . BACK SURGERY    . CORONARY ANGIOPLASTY WITH STENT PLACEMENT    . CORONARY ARTERY BYPASS GRAFT N/A 06/08/2013   Procedure: CORONARY ARTERY BYPASS GRAFTING (CABG) times four using left internal mammary and right saphenous vein.;  Surgeon: Grace Isaac, MD;  Location: [place];  Service: Open Heart Surgery;  Laterality: N/A;  . ENDARTERECTOMY FEMORAL Left 08/03/2015   Procedure: ENDARTERECTOMY LEFT ILIO-FEMORAL PROFUNDA;  Surgeon: Conrad Marysville, MD;  Location: [place];  Service: Vascular;  Laterality: Left;  . FEMORAL-POPLITEAL BYPASS GRAFT Left 08/03/2015   Procedure: BYPASS GRAFT  LEFT FEMORAL TO  BELOW KNEE POPLITEAL ARTERY USING 6MM X 80CM GORE PROPATEN GRAFT;  Surgeon: Conrad Dazey, MD;  Location: [place];  Service: Vascular;  Laterality: Left;  . INTRAOPERATIVE TRANSESOPHAGEAL ECHOCARDIOGRAM N/A 06/08/2013   Procedure: INTRAOPERATIVE TRANSESOPHAGEAL ECHOCARDIOGRAM;  Surgeon: Grace Isaac, MD;  Location: [place];  Service: Open Heart Surgery;  Laterality: N/A;  . KIDNEY STONE SURGERY    . LEFT AND RIGHT HEART CATHETERIZATION WITH CORONARY ANGIOGRAM N/A 06/07/2013   Procedure: LEFT AND RIGHT HEART CATHETERIZATION WITH CORONARY ANGIOGRAM;  Surgeon: Leonie Man, MD;  Location: [facility] CATH LAB;  Service: Cardiovascular;  Laterality: N/A;  . LOWER EXTREMITY ANGIOGRAM Bilateral 07/27/2015   Procedure: Lower Extremity Angiogram;  Surgeon: Angelia Mould, MD;  Location: [place] CV LAB;  Service: Cardiovascular;  Laterality: Bilateral;  . PATCH ANGIOPLASTY Left 08/03/2015   Procedure: PATCH ANGIOPLASTY RIGHT ILIO-FEMORAL PROFUNDA USING XENOSURE BIOLOGIC PATCH;  Surgeon: Conrad Noatak, MD;  Location: [place];  Service: Vascular;  Laterality: Left;  . PERIPHERAL VASCULAR CATHETERIZATION N/A 07/27/2015   Procedure: Abdominal Aortogram;  Surgeon: Angelia Mould, MD;  Location: [place] CV LAB;  Service: Cardiovascular;  Laterality: N/A;  . SALPINGOOPHORECTOMY     "? side"  .  TONSILLECTOMY    . TUBAL LIGATION      Family History  Problem Relation Age of Onset  . Heart disease Mother   . Diabetes Mother   . Stroke Mother   . Cancer Father   . Heart disease Father   . Diabetes Father   . Heart disease Brother   . Diabetes Paternal Aunt   . Stroke Maternal Grandmother   . Diabetes Paternal Grandfather     Social History   Tobacco Use  . Smoking status: Former Smoker    Packs/day: 0.50    Years: 10.00    Pack years: 5.00    Types: Cigarettes    Start date: 12/26/1992    Last attempt to quit: 04/15/2000    Years since quitting: 17.3  . Smokeless tobacco: Never Used  . Tobacco comment: QUIT SMOKING YEARS AGO "  Substance Use Topics  . Alcohol use: Yes    Alcohol/week: 0.0 oz    Comment: 07/27/2015 "might have a couple drinks twice/month"  . Drug use: No    Medications:  I have reviewed the patient's current medications. Prior to Admission:  Medications Prior to Admission  Medication Sig Dispense Refill Last Dose  . acetaminophen (TYLENOL) 325 MG tablet Take 650 mg by mouth every 6 (six) hours as needed for mild pain or headache.   Past Month at Unknown time  . ALPRAZolam (XANAX) 1 MG tablet Take 1 mg by mouth 4 (four) times daily as needed for anxiety.    Past Month at Unknown time  . aspirin EC 81 MG tablet Take 81 mg by mouth daily.   08/24/2017 at Unknown time  . atorvastatin (LIPITOR) 80 MG tablet Take 1 tablet (80 mg total) by mouth daily at 6 PM. 30 tablet 1 Past Month at Unknown time  . carvedilol (COREG) 3.125 MG tablet Take 1 tablet (3.125 mg total) by mouth 2 (two) times daily. 60 tablet 6 Past Month at Unknown time  . diphenhydrAMINE (BENADRYL) 25 mg capsule Take 25 mg by mouth daily as needed for allergies.   Past Month at Unknown time  . HUMALOG KWIKPEN 100 UNIT/ML KiwkPen Inject 20 Units into the skin daily as needed. Per sliding scale. For high sugar   08/24/2017 at Unknown time  . ibuprofen (ADVIL,MOTRIN) 200 MG tablet Take 200 mg by  mouth every 6 (six) hours as needed for headache or mild pain.   Past Week at Unknown time  . Insulin Detemir (LEVEMIR) 100 UNIT/ML Pen Inject 100 Units into the skin See admin instructions. Take 40 units in the morning, then take 20 units in the afternoon, then take 40 units again to equal out to 100units a day per patient   Past Month at Unknown time  . lisinopril (PRINIVIL,ZESTRIL) 5 MG tablet Take 1 tablet (5 mg total) by mouth daily. 30 tablet 1 Past Month at Unknown time  . omeprazole (PRILOSEC) 20 MG capsule Take 1 capsule by mouth 2 (two) times daily.   Past Week at Unknown time  . PROAIR HFA  108 (90 BASE) MCG/ACT inhaler Inhale 2 puffs into the lungs 4 (four) times daily as needed for shortness of breath.    Taking   Scheduled: . aspirin EC  81 mg Oral Daily  . atorvastatin  80 mg Oral q1800  . carvedilol  3.125 mg Oral BID WC  . enoxaparin (LOVENOX) injection  40 mg Subcutaneous Q24H  . insulin aspart  0-15 Units Subcutaneous TID WC  . insulin detemir  40 Units Subcutaneous QHS  . lisinopril  5 mg Oral Daily  . pantoprazole  40 mg Oral Daily   Continuous: . sodium chloride 10 mL/hr at 08/24/17 1758  . piperacillin-tazobactam (ZOSYN)  IV 3.375 g (08/25/17 1252)  . vancomycin     ZOX:[id number] **OR** acetaminophen, ALPRAZolam, hydrALAZINE, ondansetron **OR** ondansetron (ZOFRAN) IV, polyethylene glycol, traMADol  Allergies: Allergies  Allergen Reactions  . No Known Allergies     ROS:  A comprehensive review of systems was negative except for: Musculoskeletal: positive for right leg claudication, right great toe ulcer decreased blood flow to extremities  Blood pressure (!) 155/80, pulse 66, temperature 97.7 F (36.5 C), temperature source Oral, resp. rate 16, height '5\' 1"'$  (1.549 m), weight 152 lb 8.9 oz (69.2 kg), SpO2 100 %. Physical Exam  Constitutional: She is oriented to person, place, and time. She appears well-developed and well-nourished.  HENT:  Head:  Normocephalic and atraumatic.  Eyes: Pupils are equal, round, and reactive to light.  Neck: Normal range of motion.  Cardiovascular: Normal rate.  Pulses:      Dorsalis pedis pulses are 0 on the right side, and 2+ on the left side.       Posterior tibial pulses are 0 on the right side, and 0 on the left side.  Pulmonary/Chest: Effort normal.  Abdominal: Soft. She exhibits no distension. There is no tenderness.  Musculoskeletal: Normal range of motion. She exhibits no edema.  Right great toe ulcer, dry, nothing expressed, no drainage, displaced. Thickened great toe nail, some minimal erythema surround ulcer, measuring about 1.5X2cm on the medial portion of the toe, right foot warm  Neurological: She is alert and oriented to person, place, and time.  Skin: Skin is warm and dry.  Psychiatric: She has a normal mood and affect. Her behavior is normal. Judgment and thought content normal.  Vitals reviewed.     Results: Results for orders placed or performed during the hospital encounter of 08/24/17 (from the past 48 hour(s))  CBG monitoring, ED     Status: Abnormal   Collection Time: 08/24/17 11:45 AM  Result Value Ref Range   Glucose-Capillary 526 (HH) 65 - 99 mg/dL  CBC     Status: None   Collection Time: 08/24/17 11:52 AM  Result Value Ref Range   WBC 6.4 4.0 - 10.5 K/uL   RBC 4.15 3.87 - 5.11 MIL/uL   Hemoglobin 12.3 12.0 - 15.0 g/dL   HCT 36.7 36.0 - 46.0 %   MCV 88.4 78.0 - 100.0 fL   MCH 29.6 26.0 - 34.0 pg   MCHC 33.5 30.0 - 36.0 g/dL   RDW 12.6 11.5 - 15.5 %   Platelets 208 150 - 400 K/uL    Comment: Performed at [facility], [address], [place], [place] [address]  Basic metabolic panel     Status: Abnormal   Collection Time: 08/24/17 11:52 AM  Result Value Ref Range   Sodium 132 (L) 135 - 145 mmol/L   Potassium 4.2 3.5 - 5.1 mmol/L  Chloride 96 (L) 101 - 111 mmol/L   CO2 27 22 - 32 mmol/L   Glucose, Bld 570 (HH) 65 - 99 mg/dL    Comment: CRITICAL RESULT CALLED  TO, READ BACK BY AND VERIFIED WITH: CARDWELL @ 1249 ON [id number] BY HENDERSON L.    BUN 18 6 - 20 mg/dL   Creatinine, Ser 0.91 0.44 - 1.00 mg/dL   Calcium 9.4 8.9 - 10.3 mg/dL   GFR calc non Af Amer >60 >60 mL/min   GFR calc Af Amer >60 >60 mL/min    Comment: (NOTE) The eGFR has been calculated using the CKD EPI equation. This calculation has not been validated in all clinical situations. eGFR's persistently <60 mL/min signify possible Chronic Kidney Disease.    Anion gap 9 5 - 15    Comment: Performed at [facility], [address], [place], [place] [address]  CBG monitoring, ED     Status: Abnormal   Collection Time: 08/24/17  2:17 PM  Result Value Ref Range   Glucose-Capillary 337 (H) 65 - 99 mg/dL  Glucose, capillary     Status: Abnormal   Collection Time: 08/24/17 10:05 PM  Result Value Ref Range   Glucose-Capillary 352 (H) 65 - 99 mg/dL   Comment 1 Notify RN    Comment 2 Document in Chart   CBC     Status: Abnormal   Collection Time: 08/25/17  5:42 AM  Result Value Ref Range   WBC 6.5 4.0 - 10.5 K/uL   RBC 3.73 (L) 3.87 - 5.11 MIL/uL   Hemoglobin 10.9 (L) 12.0 - 15.0 g/dL   HCT 32.9 (L) 36.0 - 46.0 %   MCV 88.2 78.0 - 100.0 fL   MCH 29.2 26.0 - 34.0 pg   MCHC 33.1 30.0 - 36.0 g/dL   RDW 12.5 11.5 - 15.5 %   Platelets 189 150 - 400 K/uL    Comment: Performed at [facility], [address] [place], [place] [address]  Comprehensive metabolic panel     Status: Abnormal   Collection Time: 08/25/17  5:42 AM  Result Value Ref Range   Sodium 135 135 - 145 mmol/L   Potassium 3.8 3.5 - 5.1 mmol/L   Chloride 104 101 - 111 mmol/L   CO2 28 22 - 32 mmol/L   Glucose, Bld 199 (H) 65 - 99 mg/dL   BUN 22 (H) 6 - 20 mg/dL   Creatinine, Ser 1.28 (H) 0.44 - 1.00 mg/dL   Calcium 8.8 (L) 8.9 - 10.3 mg/dL   Total Protein 5.3 (L) 6.5 - 8.1 g/dL   Albumin 2.0 (L) 3.5 - 5.0 g/dL   AST 11 (L) 15 - 41 U/L   ALT 12 (L) 14 - 54 U/L   Alkaline Phosphatase 68 38 - 126 U/L   Total  Bilirubin 0.6 0.3 - 1.2 mg/dL   GFR calc non Af Amer 47 (L) >60 mL/min   GFR calc Af Amer 55 (L) >60 mL/min    Comment: (NOTE) The eGFR has been calculated using the CKD EPI equation. This calculation has not been validated in all clinical situations. eGFR's persistently <60 mL/min signify possible Chronic Kidney Disease.    Anion gap 3 (L) 5 - 15    Comment: Performed at [facility], [address] [place], [place] [address]  Glucose, capillary     Status: Abnormal   Collection Time: 08/25/17  8:00 AM  Result Value Ref Range   Glucose-Capillary 171 (H) 65 - 99 mg/dL  Glucose, capillary     Status: Abnormal   Collection Time: 08/25/17 12:02 PM  Result Value Ref Range   Glucose-Capillary 169 (H) 65 - 99 mg/dL   Comment 1 Notify RN    Comment 2 Document in Chart    As expected decreased ABI/ surprised not non compressible based on the diabetes   US Arterial Abi (screening Lower Extremity)  Result Date: 08/25/2017 CLINICAL DATA:  Right great toe ulceration. Right foot pain. Previous left toe amputations. Previous left iliofemoral endarterectomy and fem-pop bypass graft. Diabetes. EXAM: NONINVASIVE PHYSIOLOGIC VASCULAR STUDY OF BILATERAL LOWER EXTREMITIES TECHNIQUE: Evaluation of both lower extremities were performed at rest, including calculation of ankle-brachial indices with single level Doppler, pressure and pulse volume recording. COMPARISON:  07/20/2015 FINDINGS: Right ABI:  0.42 (previously 0.87) Left ABI:  0.95 (previously 0.42) Right Lower Extremity:  Monophasic distal arterial waveforms. Left Lower Extremity:  Biphasic distal arterial waveforms. IMPRESSION: 1. Severe progressive arterial occlusive disease in the right lower extremity. 2. Interval improvement in left lower extremity perfusion to near normal at rest. Electronically Signed   By: Lucrezia Europe M.D.   On: 08/25/2017 13:56   Dg Toe Great Right  Result Date: 08/24/2017 CLINICAL DATA:  Cellulitis of the right great toe.  EXAM: RIGHT GREAT TOE COMPARISON:  None. FINDINGS: There is no evidence of fracture or dislocation. There is mild osteopenia with slight cortical irregularity of the distal tuft of the right first distal phalanx. There is soft tissue swelling of the distal right great toe. IMPRESSION: Mild osteopenia with slight cortical irregularity of the distal tuft of the right first distal phalanx, suspicious for underlying osteomyelitis. Electronically Signed   By: Abelardo Diesel M.D.   On: 08/24/2017 13:14    Assessment & Plan:  LATASIA SILBERSTEIN is a 52 y.o. female with right great toe ulcer that is not draining and appears dry currently with eschar overlying part of the area, known PVD and worsening disease based on the symptoms. Possible great toe osteomyelitis and uncontrolled diabetes.   -Vascular revascularization needed for right limb prior to any debridement/ amputation due to difficulty with healing  -Diabetes control -MRI ? To rule in/ rule out osteomyelitis, if no osteo then can possibly be seen as outpatient by Vascular and keep ulcer dry and covered in the meantime  -Dr. Roderic Palau going to speak to Dr. Donnetta Hutching regarding the options of inpatient /outpatient care   All questions were answered to the satisfaction of the patient.   Virl Cagey 08/25/2017, 2:16 PM

## 2017-08-25 NOTE — Progress Notes (Signed)
Inpatient Diabetes Program Recommendations  AACE/ADA: New Consensus Statement on Inpatient Glycemic Control (2015)  Target Ranges:  Prepandial:   less than 140 mg/dL      Peak postprandial:   less than 180 mg/dL (1-2 hours)      Critically ill patients:  140 - 180 mg/dL  Results for Isabel [name], Isabel [name] (MRN [id number]) as of 08/25/2017 16:05  Ref. Range 08/24/2017 11:45 08/24/2017 14:17 08/24/2017 22:05 08/25/2017 08:00 08/25/2017 12:02  Glucose-Capillary Latest Ref Range: 65 - 99 mg/dL 409 (HH) 811 (H) 914 (H) 171 (H) 169 (H)   Results for Isabel [name], Isabel [name] (MRN [id number]) as of 08/25/2017 16:05  Ref. Range 05/22/2017 15:45  Hemoglobin A1C Latest Ref Range: 4.8 - 5.6 % 10.9 (H)   Review of Glycemic Control  Diabetes history: DM2 Outpatient Diabetes medications: Levemir 40 units QAM, 20 units mid-day, 40 units QPM (for total of 100 units per day), Humalog as needed Current orders for Inpatient glycemic control: Levemir 40 units QHS, Novolog 0-15 units TID with meals  Inpatient Diabetes Program Recommendations:  Insulin-Correction: Please consider ordering Novolog 0-5 units QHS for bedtime correction.   HgbA1C: A1C 10.9% on 05/22/17 indicating an average glucose of 266 mg/dl.    Outpatient Insulin regimen: Please consider discharging patient on NOVOLIN 70/30 which would be more affordable for patient. Would recommend 70/30 25 units BID (which will provide a total of 35 units for basal and 15 units for MC per day). MD may want to consider discontinuing Levemir and ordering 70/30 25 units BID as an inpatient so dose can be adjusted if needed while inpatient.   NOTE: Spoke with patient and her husband about diabetes and home regimen for diabetes control. Patient reports that she seeing her PCP for diabetes management but since she lost her insurance several months ago she has not been able to afford to see PCP. Patient states that she tried to go to the health department but she was told since she had a PCP  she needed to see them. Patient states that she applied for Medicaid but was denied.  Patient reports that she has been out of insulin and could not get any help anywhere she checked and has been very frustrated and felt like she was at a road block everywhere she tried to get help from.  Patient reports that when she was taking insulin she was using Levemir 40 units QAM, 20 units mid-day, 40 units QPM (for total of 100 units per day) and Humalog as needed for diabetes control. Discussed Levemir and Humalog insulin and how they are typically taken. Explained that currently she is ordered Levemir 40 units and her fasting glucose was 171 mg/dl this morning. Inquired about NOVOLIN insulins (NPH, Regular, 70/30) use and patient states that she has never used any of those insulins but she was told she could get generic insulins from [facility] but was not sure what they were called or how much she would have to take.  Patient states that she went to [facility] pharmacy to get generic insulin but the staff acted like they did not know what she was talking about. Provided handout on Reli-[employer] products at [facility] and discussed Novolin insulins (70/30, NPH, and Regular insulin). Patient states that she would like to see if she could use 70/30 insulin BID so it would be more affordable for her to be sure she gets her insulin.  Patient reports that she has all testing supplies at home for glucose monitoring and she gets Rel[employer]  testing supplies at [facility] already. Discussed glucose and A1C goals. Discussed importance of checking CBGs and maintaining good CBG control to prevent long-term and short-term complications. Explained how hyperglycemia leads to damage within blood vessels which lead to the common complications seen with uncontrolled diabetes. Stressed to the patient the importance of improving glycemic control to prevent further complications from uncontrolled diabetes. Patient has knowledge about DM and importance of  control but has not been able to afford insulin or to see PCP over the past several months. Discussed [facility] and [facility] and encouraged patient to establish care at one of the clinics. Also discussed insulin manufacture patient assistance programs and patient states that she has already tried to get help from manufacture of Levemir but not able to get any assistance. Provided emotional support and apologized for the frustration she has experienced over the past several months. Encouraged patient to keep working with community resources to get follow up care and medication assistance. Informed patient it would be requested to see if patient can be transitioned to 70/30 insulin which would be more affordable and patient reports that she would be appreciative of a more affordable insulin regimen.  Patient verbalized understanding of information discussed and she states that she has no further questions at this time related to diabetes.  Thanks, Orlando Penner, RN, MSN, CDE Diabetes Coordinator Inpatient Diabetes Program [phone] (Team Pager)

## 2017-08-26 LAB — GLUCOSE, CAPILLARY
GLUCOSE-CAPILLARY: 207 mg/dL — AB (ref 65–99)
Glucose-Capillary: 218 mg/dL — ABNORMAL HIGH (ref 65–99)
Glucose-Capillary: 163 mg/dL — ABNORMAL HIGH (ref 65–99)
Glucose-Capillary: 178 mg/dL — ABNORMAL HIGH (ref 65–99)

## 2017-08-26 LAB — HIV ANTIBODY (ROUTINE TESTING W REFLEX): HIV Screen 4th Generation wRfx: NONREACTIVE

## 2017-08-26 NOTE — Progress Notes (Signed)
PROGRESS NOTE    Isabel [name]  FSF:[id number] DOB: [birth year] DOA: 08/24/2017 PCP: [name], MD    Brief Narrative:  52 year old female with a history of diabetes, hypertension, known peripheral vascular disease status post left femoropopliteal bypass and iliofemoral endarterectomy, presents with right great toe ulcer with possible underlying osteomyelitis.  Patient admitted to the hospital and started on intravenous antibiotics.  ABIs noted to be 0.4 on the right.  Case discussed with vascular surgery who recommended transfer to [facility] for arteriogram.  Currently awaiting transfer.   Assessment & Plan:   Active Problems:   DM (diabetes mellitus), type 2, uncontrolled (HCC)   CAD (coronary artery disease)   S/P CABG x 4   PVD (peripheral vascular disease) (HCC)   Diabetic foot ulcer (HCC)   1. Right great toe osteomyelitis with underlying peripheral vascular disease.  Patient last had arteriogram with left iliofemoral endarterectomy and left femoropopliteal bypass.  She does complain of claudication symptoms in her lower extremities.  Ulcer on right great toe has been present for approximately a week.  Plain films indicate possible underlying osteomyelitis.  She is currently on IV antibiotics.  Vascular studies performed showed ABI of 0.4 on right.  Case discussed with Dr. Arbie Cookey on-call for vascular surgery who recommended transfer to [facility] for likely arteriogram.  Currently waiting on bed.  Overall redness and swelling of right toe appears to be improving 2. Diabetes.  Blood sugars are currently stable.  Continue on Levemir and sliding scale insulin. 3. Hypertension.  Continue on carvedilol and lisinopril.  Blood pressure stable 4. Hyperlipidemia.  Continue statin   DVT prophylaxis: Lovenox Code Status: Full code Family Communication: No family present Disposition Plan: Waiting on bed at [facility]   Consultants:   General  surgery  Vascular surgery  Procedures:     Antimicrobials:   Vancomycin 5/12 >  Zosyn 5/12 >   Subjective: Does not complain of any pain in her toe.  Does not have any new complaints  Objective: Vitals:   08/25/17 2112 08/25/17 2206 08/26/17 0506 08/26/17 1444  BP: (!) 174/84 (!) 158/74 (!) 157/78 (!) 145/83  Pulse: 93 70 75 76  Resp: 16 16 18 18   Temp:  98.4 F (36.9 C) 98.5 F (36.9 C) 98.3 F (36.8 C)  TempSrc:  Oral Oral Oral  SpO2: 100% 100% 98% 98%  Weight:      Height:        Intake/Output Summary (Last 24 hours) at 08/26/2017 1642 Last data filed at 08/26/2017 1300 Gross per 24 hour  Intake 260 ml  Output 1350 ml  Net -1090 ml   Filed Weights   08/24/17 1127 08/25/17 0606  Weight: 70.8 kg (156 lb) 69.2 kg (152 lb 8.9 oz)    Examination:  General exam: Alert, awake, oriented x 3 Respiratory system: Clear to auscultation. Respiratory effort normal. Cardiovascular system:RRR. No murmurs, rubs, gallops. Gastrointestinal system: Abdomen is nondistended, soft and nontender. No organomegaly or masses felt. Normal bowel sounds heard. Central nervous system: Alert and oriented. No focal neurological deficits. Extremities: No cyanosis or clubbing Skin: Right great toe with ulcer noted to containing black eschar.  Overall erythema and right toe improving.  Swelling the right toe also is improving.  No drainage noted. Psychiatry: Judgement and insight appear normal. Mood & affect appropriate.      Data Reviewed: I have personally reviewed following labs and imaging studies  CBC: Recent Labs  Lab 08/24/17 1152 08/25/17  0542  WBC 6.4 6.5  HGB 12.3 10.9*  HCT 36.7 32.9*  MCV 88.4 88.2  PLT 208 189   Basic Metabolic Panel: Recent Labs  Lab 08/24/17 1152 08/25/17 0542  NA 132* 135  K 4.2 3.8  CL 96* 104  CO2 27 28  GLUCOSE 570* 199*  BUN 18 22*  CREATININE 0.91 1.28*  CALCIUM 9.4 8.8*   GFR: Estimated Creatinine Clearance: 45.8 mL/min (A)  (by C-G formula based on SCr of 1.28 mg/dL (H)). Liver Function Tests: Recent Labs  Lab 08/25/17 0542  AST 11*  ALT 12*  ALKPHOS 68  BILITOT 0.6  PROT 5.3*  ALBUMIN 2.0*   No results for input(s): LIPASE, AMYLASE in the last 168 hours. No results for input(s): AMMONIA in the last 168 hours. Coagulation Profile: No results for input(s): INR, PROTIME in the last 168 hours. Cardiac Enzymes: No results for input(s): CKTOTAL, CKMB, CKMBINDEX, TROPONINI in the last 168 hours. BNP (last 3 results) No results for input(s): PROBNP in the last 8760 hours. HbA1C: No results for input(s): HGBA1C in the last 72 hours. CBG: Recent Labs  Lab 08/25/17 1739 08/25/17 2111 08/26/17 0801 08/26/17 1147 08/26/17 1612  GLUCAP 157* 240* 207* 218* 163*   Lipid Profile: No results for input(s): CHOL, HDL, LDLCALC, TRIG, CHOLHDL, LDLDIRECT in the last 72 hours. Thyroid Function Tests: No results for input(s): TSH, T4TOTAL, FREET4, T3FREE, THYROIDAB in the last 72 hours. Anemia Panel: No results for input(s): VITAMINB12, FOLATE, FERRITIN, TIBC, IRON, RETICCTPCT in the last 72 hours. Sepsis Labs: No results for input(s): PROCALCITON, LATICACIDVEN in the last 168 hours.  No results found for this or any previous visit (from the past 240 hour(s)).       Radiology Studies: US Arterial Abi (screening Lower Extremity)  Result Date: 08/25/2017 CLINICAL DATA:  Right great toe ulceration. Right foot pain. Previous left toe amputations. Previous left iliofemoral endarterectomy and fem-pop bypass graft. Diabetes. EXAM: NONINVASIVE PHYSIOLOGIC VASCULAR STUDY OF BILATERAL LOWER EXTREMITIES TECHNIQUE: Evaluation of both lower extremities were performed at rest, including calculation of ankle-brachial indices with single level Doppler, pressure and pulse volume recording. COMPARISON:  07/20/2015 FINDINGS: Right ABI:  0.42 (previously 0.87) Left ABI:  0.95 (previously 0.42) Right Lower Extremity:  Monophasic  distal arterial waveforms. Left Lower Extremity:  Biphasic distal arterial waveforms. IMPRESSION: 1. Severe progressive arterial occlusive disease in the right lower extremity. 2. Interval improvement in left lower extremity perfusion to near normal at rest. Electronically Signed   By: Corlis Leak M.D.   On: 08/25/2017 13:56        Scheduled Meds: . aspirin EC  81 mg Oral Daily  . atorvastatin  80 mg Oral q1800  . carvedilol  3.125 mg Oral BID WC  . enoxaparin (LOVENOX) injection  40 mg Subcutaneous Q24H  . insulin aspart  0-15 Units Subcutaneous TID WC  . insulin detemir  40 Units Subcutaneous QHS  . lisinopril  10 mg Oral Daily  . pantoprazole  40 mg Oral Daily   Continuous Infusions: . sodium chloride 10 mL/hr at 08/24/17 1758  . piperacillin-tazobactam (ZOSYN)  IV 3.375 g (08/26/17 1257)  . vancomycin 500 mg (08/26/17 0532)     LOS: 2 days    Time spent:    Erick Blinks, MD [facility] Pager [phone]  If 7PM-7AM, please contact night-coverage [email or web address] Password [place] 08/26/2017, 4:42 PM

## 2017-08-26 NOTE — Plan of Care (Signed)
No s/s anxiety noted. Free from injury

## 2017-08-26 NOTE — Progress Notes (Signed)
Inpatient Diabetes Program Recommendations  AACE/ADA: New Consensus Statement on Inpatient Glycemic Control (2015)  Target Ranges:  Prepandial:   less than 140 mg/dL      Peak postprandial:   less than 180 mg/dL (1-2 hours)      Critically ill patients:  140 - 180 mg/dL   Results for Isabel [name], Isabel [name] (MRN [id number]) as of 08/26/2017 11:15  Ref. Range 08/25/2017 08:00 08/25/2017 12:02 08/25/2017 17:39 08/25/2017 21:11 08/26/2017 08:01  Glucose-Capillary Latest Ref Range: 65 - 99 mg/dL 159 (H) 539 (H) 672 (H) 240 (H) 207 (H)   Review of Glycemic Control  Diabetes history: DM2 Outpatient Diabetes medications: Levemir 40 units QAM, 20 units mid-day, 40 units QPM (for total of 100 units per day), Humalog as needed Current orders for Inpatient glycemic control: Levemir 40 units QHS, Novolog 0-15 units TID with meals  Inpatient Diabetes Program Recommendations:  Insulin-Correction: Bedtime glucose 240 mg/dl on 8/97/91 and no Novolog given since no correction ordered for bedtime. Fasting glucose 207 mg/dl as a result. Please consider ordering Novolog 0-5 units QHS for bedtime correction.   HgbA1C: A1C 10.9% on 05/22/17 indicating an average glucose of 266 mg/dl.    Outpatient Insulin regimen: Please consider discharging patient on NOVOLIN 70/30 which would be more affordable for patient. Would recommend 70/30 25 units BID (which will provide a total of 35 units for basal and 15 units for MC per day). MD may want to consider discontinuing Levemir and ordering 70/30 25 units BID as an inpatient so dose can be adjusted if needed while inpatient.    Thanks, Orlando Penner, RN, MSN, CDE Diabetes Coordinator Inpatient Diabetes Program [phone] (Team Pager from 8am to 5pm)

## 2017-08-26 NOTE — Progress Notes (Signed)
Patient transferred to [facility] via [facility]. Report given to [facility] and accepting floor charge nurse. Patient and vital signs are within normal limits

## 2017-08-26 NOTE — Progress Notes (Signed)
Received report from  RN. Pualani Borah, Drinda Butts, Charity fundraiser

## 2017-08-26 NOTE — Care Management (Signed)
CM consult placed for med assistance. Pt has PCP. Pt has medicaid listed. CM has asked FC to verify accuracy of listing. CM will see pt once medicaid status has been determined.

## 2017-08-27 DIAGNOSIS — I10 Essential (primary) hypertension: Secondary | ICD-10-CM

## 2017-08-27 DIAGNOSIS — I739 Peripheral vascular disease, unspecified: Secondary | ICD-10-CM

## 2017-08-27 DIAGNOSIS — I70261 Atherosclerosis of native arteries of extremities with gangrene, right leg: Secondary | ICD-10-CM

## 2017-08-27 LAB — BASIC METABOLIC PANEL
Anion gap: 8 (ref 5–15)
BUN: 17 mg/dL (ref 6–20)
CALCIUM: 9.5 mg/dL (ref 8.9–10.3)
CO2: 26 mmol/L (ref 22–32)
CREATININE: 1.13 mg/dL — AB (ref 0.44–1.00)
Chloride: 106 mmol/L (ref 101–111)
GFR calc Af Amer: >60 mL/min (ref >60)
GFR, EST NON AFRICAN AMERICAN: 55 mL/min — AB (ref >60)
GLUCOSE: 125 mg/dL — AB (ref 65–99)
Potassium: 3.8 mmol/L (ref 3.5–5.1)
Sodium: 140 mmol/L (ref 135–145)

## 2017-08-27 LAB — GLUCOSE, CAPILLARY
Glucose-Capillary: 121 mg/dL — ABNORMAL HIGH (ref 65–99)
Glucose-Capillary: 242 mg/dL — ABNORMAL HIGH (ref 65–99)
Glucose-Capillary: 273 mg/dL — ABNORMAL HIGH (ref 65–99)
Glucose-Capillary: 274 mg/dL — ABNORMAL HIGH (ref 65–99)

## 2017-08-27 NOTE — Progress Notes (Signed)
[facility] PROGRESS NOTE  Isabel [name] VHQ:[id number] DOB: Jan 05, 1966 DOA: 08/24/2017  PCP: [name], MD  Brief History/Interval Summary: 52 year old female with a history of diabetes, hypertension, known peripheral vascular disease status post left femoropopliteal bypass and iliofemoral endarterectomy, presents with right great toe ulcer with possible underlying osteomyelitis.  Patient was admitted to [facility] and started on intravenous antibiotics.  ABIs noted to be 0.4 on the right.  Case discussed with vascular surgery who recommended transfer to [facility] for arteriogram.    Reason for Visit: Right great toe ulcer with possible underlying osteomyelitis with peripheral vascular disease  Consultants: Vascular surgery  Procedures: None yet  Antibiotics: Vancomycin and Zosyn  Subjective/Interval History: Patient complains of pain in her right toe.  Otherwise she complains that she is hungry.  She denies any nausea vomiting.  ROS: Denies any chest pain or shortness of breath  Objective:  Vital Signs  Vitals:   08/26/17 2100 08/26/17 2203 08/27/17 0522 08/27/17 0833  BP: (!) 167/79 (!) 142/75 (!) 155/77 (!) 178/84  Pulse: 84 80 75 80  Resp: 17 20 15 18   Temp: 98.7 F (37.1 C) 98.1 F (36.7 C) 98.3 F (36.8 C) 98.3 F (36.8 C)  TempSrc: Oral Oral Oral Oral  SpO2: 98% 99% 95% 98%  Weight:  70.7 kg (155 lb 14.4 oz)    Height:  5\' 1"  (1.549 m)      Intake/Output Summary (Last 24 hours) at 08/27/2017 1228 Last data filed at 08/27/2017 1006 Gross per 24 hour  Intake 600 ml  Output 2000 ml  Net -1400 ml   Filed Weights   08/24/17 1127 08/25/17 0606 08/26/17 2203  Weight: 70.8 kg (156 lb) 69.2 kg (152 lb 8.9 oz) 70.7 kg (155 lb 14.4 oz)    General appearance: alert, cooperative, appears stated age and no distress Head: Normocephalic, without obvious abnormality, atraumatic Neck: no adenopathy, no carotid bruit, no JVD, supple,  symmetrical, trachea midline and thyroid not enlarged, symmetric, no tenderness/mass/nodules Resp: clear to auscultation bilaterally Cardio: regular rate and rhythm, S1, S2 normal, no murmur, click, rub or gallop GI: soft, non-tender; bowel sounds normal; no masses,  no organomegaly Extremities: Ulcer noted over the first toe on the right.  No drainage noted.  Poor pulsations on the right foot compared to left. Neurologic: No obvious focal neurological deficits.  Lab Results:  Data Reviewed: I have personally reviewed following labs and imaging studies  CBC: Recent Labs  Lab 08/24/17 1152 08/25/17 0542  WBC 6.4 6.5  HGB 12.3 10.9*  HCT 36.7 32.9*  MCV 88.4 88.2  PLT 208 189    Basic Metabolic Panel: Recent Labs  Lab 08/24/17 1152 08/25/17 0542 08/27/17 0811  NA 132* 135 140  K 4.2 3.8 3.8  CL 96* 104 106  CO2 27 28 26   GLUCOSE 570* 199* 125*  BUN 18 22* 17  CREATININE 0.91 1.28* 1.13*  CALCIUM 9.4 8.8* 9.5    GFR: Estimated Creatinine Clearance: 52.4 mL/min (A) (by C-G formula based on SCr of 1.13 mg/dL (H)).  Liver Function Tests: Recent Labs  Lab 08/25/17 0542  AST 11*  ALT 12*  ALKPHOS 68  BILITOT 0.6  PROT 5.3*  ALBUMIN 2.0*    CBG: Recent Labs  Lab 08/26/17 1147 08/26/17 1612 08/26/17 2007 08/27/17 0831 08/27/17 1132  GLUCAP 218* 163* 178* 121* 242*    Radiology Studies: No results found.   Medications:  Scheduled: . aspirin EC  81 mg  Oral Daily  . atorvastatin  80 mg Oral q1800  . carvedilol  3.125 mg Oral BID WC  . enoxaparin (LOVENOX) injection  40 mg Subcutaneous Q24H  . insulin aspart  0-15 Units Subcutaneous TID WC  . insulin detemir  40 Units Subcutaneous QHS  . lisinopril  10 mg Oral Daily  . pantoprazole  40 mg Oral Daily   Continuous: . sodium chloride 10 mL/hr at 08/24/17 1758  . piperacillin-tazobactam (ZOSYN)  IV 3.375 g (08/27/17 1221)  . vancomycin 500 mg (08/27/17 0553)   ZOX:[id number] **OR** acetaminophen,  ALPRAZolam, hydrALAZINE, ondansetron **OR** ondansetron (ZOFRAN) IV, polyethylene glycol, traMADol  Assessment/Plan:  Active Problems:   DM (diabetes mellitus), type 2, uncontrolled (HCC)   CAD (coronary artery disease)   S/P CABG x 4   PVD (peripheral vascular disease) (HCC)   Diabetic foot ulcer (HCC)    Right great toe osteomyelitis with underlying peripheral vascular disease Patient last had arteriogram with left iliofemoral endarterectomy and left femoropopliteal bypass.  She does complain of claudication symptoms in her lower extremities.  Ulcer on right great toe has been present for approximately a week.  Plain films indicate possible underlying osteomyelitis.  She is currently on IV antibiotics.  Vascular studies performed showed ABI of 0.4 on right.  Case discussed with Dr. Arbie Cookey with vascular surgery.  Transfer to [facility] was recommended.  Continue with antibiotics for now.  Await arteriogram by vascular surgery.  Patient is hemodynamically stable.  Continue aspirin.  Type II diabetes with vascular complications Blood sugars are currently stable.  Continue on Levemir and sliding scale insulin.  Essential Hypertension Blood pressure noted to be high at times.  Continue current medications.  Monitor closely.  Continue lisinopril for now.  Hyperlipidemia Continue statin  Mild acute renal failure Mildly elevated BUN and creatinine were noted.  Slightly better today.  Continue to monitor urine output.  Normocytic anemia Drop in hemoglobin is likely dilutional.  Continue to monitor.  DVT Prophylaxis: Lovenox    Code Status: Full code Family Communication: Discussed with the patient Disposition Plan: Management as outlined above.    LOS: 3 days   [name]  [facility] Pager [phone] 08/27/2017, 12:28 PM  If 7PM-7AM, please contact night-coverage at [email or web address], password [facility]

## 2017-08-27 NOTE — Consult Note (Addendum)
[facility] NOTE   MRN : [id number]  Reason for Consult: GT non healing ulcer Referring Physician:  ED   History of Present Illness: 52 y/o female with known  peripheral vascular disease status post iliofemoral endarterectomy, left common femoral to below-the-knee popliteal artery bypass, who came to hospital with worsening right foot pain and non healing right GT ulcer.  The toe ulcer developed about a week ago.  She has symptoms of right LE claudication after walking about 200 feet she has to stop and rest.   For the past 6 months it has gotten progressively worse.  She denise rest pain, fever and chills.  She was last seen by our office in 2017 by Dr. Imogene Burn.  Her ABI's at the time were right 0.70 with TBI of 0.33 and left 0.92 with TBI of 0.74.  She missed her next f/u appt. In 2018.    Past medical history includes: DM, HTN, hyperlipidemia, COPD and anxiety.     Current Facility-Administered Medications  Medication Dose Route Frequency Provider Last Rate Last Dose  . 0.9 %  sodium chloride infusion   Intravenous Continuous Meredeth Ide, MD 10 mL/hr at 08/24/17 1758    . acetaminophen (TYLENOL) tablet 650 mg  650 mg Oral Q6H PRN Meredeth Ide, MD   650 mg at 08/24/17 2255   Or  . acetaminophen (TYLENOL) suppository 650 mg  650 mg Rectal Q6H PRN Meredeth Ide, MD      . ALPRAZolam Prudy Feeler) tablet 1 mg  1 mg Oral TID PRN Meredeth Ide, MD   1 mg at 08/24/17 2256  . aspirin EC tablet 81 mg  81 mg Oral Daily Meredeth Ide, MD   81 mg at 08/26/17 0931  . atorvastatin (LIPITOR) tablet 80 mg  80 mg Oral q1800 Meredeth Ide, MD   80 mg at 08/26/17 1721  . carvedilol (COREG) tablet 3.125 mg  3.125 mg Oral BID WC Meredeth Ide, MD   3.125 mg at 08/26/17 1721  . enoxaparin (LOVENOX) injection 40 mg  40 mg Subcutaneous Q24H Sharl Ma, Gagan S, MD   40 mg at 08/26/17 1722  . hydrALAZINE (APRESOLINE) tablet 25 mg  25 mg Oral Q6H PRN Meredeth Ide, MD      . insulin  aspart (novoLOG) injection 0-15 Units  0-15 Units Subcutaneous TID WC Meredeth Ide, MD   3 Units at 08/26/17 1721  . insulin detemir (LEVEMIR) injection 40 Units  40 Units Subcutaneous QHS Meredeth Ide, MD   40 Units at 08/26/17 2015  . lisinopril (PRINIVIL,ZESTRIL) tablet 10 mg  10 mg Oral Daily Erick Blinks, MD   10 mg at 08/26/17 0931  . ondansetron (ZOFRAN) tablet 4 mg  4 mg Oral Q6H PRN Meredeth Ide, MD       Or  . ondansetron (ZOFRAN) injection 4 mg  4 mg Intravenous Q6H PRN Meredeth Ide, MD      . pantoprazole (PROTONIX) EC tablet 40 mg  40 mg Oral Daily Meredeth Ide, MD   40 mg at 08/26/17 0931  . piperacillin-tazobactam (ZOSYN) IVPB 3.375 g  3.375 g Intravenous Q8H Meredeth Ide, MD 12.5 mL/hr at 08/27/17 0552 3.375 g at 08/27/17 0552  . polyethylene glycol (MIRALAX / GLYCOLAX) packet 17 g  17 g Oral Daily PRN Meredeth Ide, MD      . traMADol Janean Sark) tablet 50 mg  50 mg Oral Q6H PRN  Meredeth Ide, MD   50 mg at 08/26/17 2002  . vancomycin (VANCOCIN) 500 mg in sodium chloride 0.9 % 100 mL IVPB  500 mg Intravenous Q12H Erick Blinks, MD 100 mL/hr at 08/27/17 0553 500 mg at 08/27/17 0553    Pt meds include: Statin :Yes Betablocker: Yes ASA: Yes Other anticoagulants/antiplatelets: none  Past Medical History:  Diagnosis Date  . Anxiety   . CHF (congestive heart failure) (HCC)   . Coronary artery disease    a. s/p CABG in 2015 with LIMA-LAD, seq reverse SVG-OM-dLCx, and reverse SVG-dRCA  . Diabetes mellitus without complication (HCC)    Type 2  . GERD (gastroesophageal reflux disease)   . History of kidney stones   . Peripheral vascular disease [facility])     Past Surgical History:  Procedure Laterality Date  . AMPUTATION Left 08/18/2015   Procedure: Left Foot 4th and 5th Ray Amputation With [facility] Placement;  Surgeon: Nadara Mustard, MD;  Location: [facility] OR;  Service: Orthopedics;  Laterality: Left;  . AMPUTATION TOE Left 08/03/2015   Procedure: AMPUTATION LEFT FIFTH TOE;   Surgeon: Fransisco Hertz, MD;  Location: [facility] OR;  Service: Vascular;  Laterality: Left;  . BACK SURGERY    . CORONARY ANGIOPLASTY WITH STENT PLACEMENT    . CORONARY ARTERY BYPASS GRAFT N/A 06/08/2013   Procedure: CORONARY ARTERY BYPASS GRAFTING (CABG) times four using left internal mammary and right saphenous vein.;  Surgeon: Delight Ovens, MD;  Location: [facility] OR;  Service: Open Heart Surgery;  Laterality: N/A;  . ENDARTERECTOMY FEMORAL Left 08/03/2015   Procedure: ENDARTERECTOMY LEFT ILIO-FEMORAL PROFUNDA;  Surgeon: Fransisco Hertz, MD;  Location: [facility] OR;  Service: Vascular;  Laterality: Left;  . FEMORAL-POPLITEAL BYPASS GRAFT Left 08/03/2015   Procedure: BYPASS GRAFT  LEFT FEMORAL TO  BELOW KNEE POPLITEAL ARTERY USING X 80CM GORE PROPATEN GRAFT;  Surgeon: Fransisco Hertz, MD;  Location[facility] OR;  Service: Vascular;  Laterality: Left;  . INTRAOPERATIVE TRANSESOPHAGEAL ECHOCARDIOGRAM N/A 06/08/2013   Procedure: INTRAOPERATIVE TRANSESOPHAGEAL ECHOCARDIOGRAM;  Surgeon: Delight Ovens, MD;  Location: [facility] OR;  Service: Open Heart Surgery;  Laterality: N/A;  . KIDNEY STONE SURGERY    . LEFT AND RIGHT HEART CATHETERIZATION WITH CORONARY ANGIOGRAM N/A 06/07/2013   Procedure: LEFT AND RIGHT HEART CATHETERIZATION WITH CORONARY ANGIOGRAM;  Surgeon: Marykay Lex, MD;  Location: [facility] CATH LAB;  Service: Cardiovascular;  Laterality: N/A;  . LOWER EXTREMITY ANGIOGRAM Bilateral 07/27/2015   Procedure: Lower Extremity Angiogram;  Surgeon: Chuck Hint, MD;  Location: [facility] INVASIVE CV LAB;  Service: Cardiovascular;  Laterality: Bilateral;  . PATCH ANGIOPLASTY Left 08/03/2015   Procedure: PATCH ANGIOPLASTY RIGHT ILIO-FEMORAL PROFUNDA USING XENOSURE BIOLOGIC PATCH;  Surgeon: Fransisco Hertz, MD;  Location: [facility] OR;  Service: Vascular;  Laterality: Left;  . PERIPHERAL VASCULAR CATHETERIZATION N/A 07/27/2015   Procedure: Abdominal Aortogram;  Surgeon: Chuck Hint, MD;  Location: [facility] INVASIVE CV LAB;  Service:  Cardiovascular;  Laterality: N/A;  . SALPINGOOPHORECTOMY     "? side"  . TONSILLECTOMY    . TUBAL LIGATION      Social History Social History   Tobacco Use  . Smoking status: Former Smoker    Packs/day: 0.50    Years: 10.00    Pack years: 5.00    Types: Cigarettes    Start date: 12/26/1992    Last attempt to quit: 04/15/2000    Years since quitting: 17.3  . Smokeless tobacco: Never Used  . Tobacco comment:  QUIT SMOKING YEARS AGO "  Substance Use Topics  . Alcohol use: Yes    Alcohol/week: 0.0 oz    Comment: 07/27/2015 "might have a couple drinks twice/month"  . Drug use: No    Family History Family History  Problem Relation Age of Onset  . Heart disease Mother   . Diabetes Mother   . Stroke Mother   . Cancer Father   . Heart disease Father   . Diabetes Father   . Heart disease Brother   . Diabetes Paternal Aunt   . Stroke Maternal Grandmother   . Diabetes Paternal Grandfather     Allergies  Allergen Reactions  . No Known Allergies      REVIEW OF SYSTEMS  General: [ ]  Weight loss, [ ]  Fever, [ ]  chills Neurologic: [ ]  Dizziness, [ ]  Blackouts, [ ]  Seizure [ ]  Stroke, [ ]  "Mini stroke", [ ]  Slurred speech, [ ]  Temporary blindness; [ ]  weakness in arms or legs, [ ]  Hoarseness [ ]  Dysphagia Cardiac: [ ]  Chest pain/pressure, [ ]  Shortness of breath at rest [ ]  Shortness of breath with exertion, [ ]  Atrial fibrillation or irregular heartbeat  Vascular: [x ] Pain in legs with walking, [ ]  Pain in legs at rest, [ ]  Pain in legs at night,  [x ] Non-healing ulcer, [ ]  Blood clot in vein/DVT,   Pulmonary: [ ]  Home oxygen, [ ]  Productive cough, [ ]  Coughing up blood, [ ]  Asthma,  [ ]  Wheezing [ ]  COPD Musculoskeletal:  [ ]  Arthritis, [ ]  Low back pain, [ ]  Joint pain Hematologic: [ ]  Easy Bruising, [ ]  Anemia; [ ]  Hepatitis Gastrointestinal: [ ]  Blood in stool, [ ]  Gastroesophageal Reflux/heartburn, Urinary: [x ] chronic Kidney disease, [ ]  on HD - [ ]  MWF or [ ]  TTHS, [  ] Burning with urination, [ ]  Difficulty urinating Skin: [ ]  Rashes, x[ ]  Wounds Psychological: [x ] Anxiety, [ ]  Depression  Physical Examination Vitals:   08/26/17 2100 08/26/17 2203 08/27/17 0522 08/27/17 0833  BP: (!) 167/79 (!) 142/75 (!) 155/77 (!) 178/84  Pulse: 84 80 75 80  Resp: 17 20 15 18   Temp: 98.7 F (37.1 C) 98.1 F (36.7 C) 98.3 F (36.8 C) 98.3 F (36.8 C)  TempSrc: Oral Oral Oral Oral  SpO2: 98% 99% 95% 98%  Weight:  155 lb 14.4 oz (70.7 kg)    Height:  5\' 1"  (1.549 m)     Body mass index is 29.46 kg/m.  General:  WDWN in NAD HENT: WNL, normocephalic  Eyes: Pupils equal Pulmonary: normal non-labored breathing , without Rales, rhonchi,  wheezing Cardiac: RRR, without  Murmurs, rubs or gallops; No carotid bruits Abdomen: soft, NT, no masses Skin: no rashes,   dry Gangrene right GT , no cellulitis;         Vascular Exam/Pulses:radial, brachial, femoral pulses palpable B, doppler signals DP/PT/peronael   Musculoskeletal: no muscle wasting or atrophy; no edema, left 4 th and 5th toe amputation site well healed.  Neurologic: A&O X 3; Appropriate Affect ;  SENSATION: normal; MOTOR FUNCTION: 5/5 grossly intact Symmetric Speech is fluent/normal   Significant Diagnostic Studies: CBC Lab Results  Component Value Date   WBC 6.5 08/25/2017   HGB 10.9 (L) 08/25/2017   HCT 32.9 (L) 08/25/2017   MCV 88.2 08/25/2017   PLT 189 08/25/2017    BMET    Component Value Date/Time   NA 135 08/25/2017 0542   K 3.8  08/25/2017 0542   CL 104 08/25/2017 0542   CO2 28 08/25/2017 0542   GLUCOSE 199 (H) 08/25/2017 0542   BUN 22 (H) 08/25/2017 0542   CREATININE 1.28 (H) 08/25/2017 0542   CALCIUM 8.8 (L) 08/25/2017 0542   GFRNONAA 47 (L) 08/25/2017 0542   GFRAA 55 (L) 08/25/2017 0542   Estimated Creatinine Clearance: 46.3 mL/min (A) (by C-G formula based on SCr of 1.28 mg/dL (H)).  COAG Lab Results  Component Value Date   INR 1.10 08/03/2015   INR 1.28  06/08/2013   INR 0.95 06/07/2013     Non-Invasive Vascular Imaging: Pending ABI and arterial duplex  ASSESSMENT/PLAN:  PAD s/p  PROCEDURE: 1.  Left iliofemoral endarterectomy with bovine patch angioplasty 2.  Extended left profundoplasty  3.  Left common femoral artery to below-the-knee popliteal artery bypass with Propaten 4.  Left fifth ray amputation New right LE claudication and non healing GT ulcer/ dry gangrene IV antibiotics Vanco and Zosyn  I feel with her new symptoms and non healing ulcer we will schedule her for an angiogram  With possible intervention.   I will allow Dr. Arbie Cookey to schedule this once he has examined the patient.       Mosetta Pigeon 08/27/2017 8:35 AM   I have examined the patient, reviewed and agree with above.  Patient had a progressive history of claudication.  Noted what she felt was a infection in her great toenail.  Had progressed to some dry gangrenous changes over the medial tip of her toe.  She was admitted to [facility] on Monday for antibiotics.  I was consulted at that time and suggested transfer to [facility] hospital.  Unfortunately this took well over 48 hours and she was admitted to [facility].  Last night around 10 pm.  She denies any fevers and no erythema on her foot prior to the admission.  She has been on antibiotics since admission.  She does have a palpable right femoral pulse and absent popliteal and distal pulses.  She is status post left femoral endarterectomy and left femoral to popliteal bypass with Dr. Jacelyn Pi 2 years ago.  She had an excellent result and eventually healed a ray amputation of her fifth toe.  She has an easily palpable left dorsalis pedis pulse.  I have recommended arteriography for reevaluation.  I have reviewed her arteriogram from 2 years ago.  At that time her superficial femoral artery was patent on the right and she did have diffuse tibial disease with runoff mainly through her peroneal and posterior tibial  artery.  She has had vein harvest from her right leg for coronary bypass grafting in 2015.  She will undergo arteriography tomorrow.  She does not have any evidence of invasive infection and simply has dry gangrene on the distal tip of her right great toe.  Would recommend arteriography tomorrow and probable discharge following her arteriogram.  And would potentially have readmission for bypass surgery if indicated.  Also may be a candidate for endovascular treatment tomorrow.  She understands this plan.  Will discuss with Dr. Imogene Burn who will be doing the arteriogram tomorrow  Gretta Began, MD 08/27/2017 10:26 AM

## 2017-08-28 ENCOUNTER — Inpatient Hospital Stay (HOSPITAL_COMMUNITY)
Admission: EM | Disposition: A | Discharge status: Home / Self Care | Payer: Self-pay | Source: Home / Self Care | Attending: Internal Medicine

## 2017-08-28 HISTORY — PX: ABDOMINAL AORTOGRAM W/LOWER EXTREMITY: CATH118223

## 2017-08-28 HISTORY — PX: PERIPHERAL VASCULAR INTERVENTION: CATH118257

## 2017-08-28 LAB — CBC
HEMATOCRIT: 33.1 % — AB (ref 36.0–46.0)
Hemoglobin: 11.1 g/dL — ABNORMAL LOW (ref 12.0–15.0)
MCH: 29.3 pg (ref 26.0–34.0)
MCHC: 33.5 g/dL (ref 30.0–36.0)
MCV: 87.3 fL (ref 78.0–100.0)
PLATELETS: 201 10*3/uL (ref 150–400)
RBC: 3.79 MIL/uL — AB (ref 3.87–5.11)
RDW: 12.5 % (ref 11.5–15.5)
WBC: 10 10*3/uL (ref 4.0–10.5)

## 2017-08-28 LAB — BASIC METABOLIC PANEL
Anion gap: 8 (ref 5–15)
BUN: 16 mg/dL (ref 6–20)
CO2: 29 mmol/L (ref 22–32)
Calcium: 9.1 mg/dL (ref 8.9–10.3)
Chloride: 104 mmol/L (ref 101–111)
Creatinine, Ser: 1.3 mg/dL — ABNORMAL HIGH (ref 0.44–1.00)
GFR calc Af Amer: 54 mL/min — ABNORMAL LOW (ref >60)
GFR, EST NON AFRICAN AMERICAN: 46 mL/min — AB (ref >60)
GLUCOSE: 180 mg/dL — AB (ref 65–99)
POTASSIUM: 3.9 mmol/L (ref 3.5–5.1)
Sodium: 141 mmol/L (ref 135–145)

## 2017-08-28 LAB — POCT ACTIVATED CLOTTING TIME: Activated Clotting Time: 219 seconds

## 2017-08-28 LAB — GLUCOSE, CAPILLARY
GLUCOSE-CAPILLARY: 191 mg/dL — AB (ref 65–99)
GLUCOSE-CAPILLARY: 164 mg/dL — AB (ref 65–99)
Glucose-Capillary: 148 mg/dL — ABNORMAL HIGH (ref 65–99)
Glucose-Capillary: 149 mg/dL — ABNORMAL HIGH (ref 65–99)

## 2017-08-28 LAB — CREATININE, SERUM
Creatinine, Ser: 1.29 mg/dL — ABNORMAL HIGH (ref 0.44–1.00)
GFR calc Af Amer: 54 mL/min — ABNORMAL LOW (ref >60)
GFR calc non Af Amer: 47 mL/min — ABNORMAL LOW (ref >60)

## 2017-08-28 LAB — VANCOMYCIN, TROUGH: Vancomycin Tr: 17 ug/mL (ref 15–20)

## 2017-08-28 SURGERY — ABDOMINAL AORTOGRAM W/LOWER EXTREMITY
Anesthesia: LOCAL | Laterality: Right

## 2017-08-28 MED ORDER — MIDAZOLAM HCL 2 MG/2ML IJ SOLN
INTRAMUSCULAR | Status: DC | PRN
  Administered 2017-08-28 (×3): 1 mg via INTRAVENOUS
  Administered 2017-08-28: 2 mg via INTRAVENOUS

## 2017-08-28 MED ORDER — FAMOTIDINE IN NACL 20-0.9 MG/50ML-% IV SOLN
INTRAVENOUS | Status: AC | PRN
  Administered 2017-08-28: 20 mg via INTRAVENOUS

## 2017-08-28 MED ORDER — SODIUM CHLORIDE 0.9 % IV SOLN: 250.0000 mL | INTRAVENOUS | Status: DC | PRN

## 2017-08-28 MED ORDER — IODIXANOL 320 MG/ML IV SOLN
INTRAVENOUS | Status: DC | PRN
  Administered 2017-08-28: 160 mL via INTRA_ARTERIAL

## 2017-08-28 MED ORDER — LABETALOL HCL 5 MG/ML IV SOLN
10.0000 mg | INTRAVENOUS | Status: DC | PRN
  Administered 2017-08-28: 10 mg via INTRAVENOUS

## 2017-08-28 MED ORDER — FENTANYL CITRATE (PF) 100 MCG/2ML IJ SOLN
INTRAMUSCULAR | Status: AC
  Filled 2017-08-28: qty 2

## 2017-08-28 MED ORDER — HYDRALAZINE HCL 20 MG/ML IJ SOLN: 5.0000 mg | INTRAMUSCULAR | Status: DC | PRN

## 2017-08-28 MED ORDER — HEPARIN SODIUM (PORCINE) 1000 UNIT/ML IJ SOLN
INTRAMUSCULAR | Status: AC
  Filled 2017-08-28: qty 1

## 2017-08-28 MED ORDER — HYDROMORPHONE HCL 1 MG/ML IJ SOLN
0.5000 mg | INTRAMUSCULAR | Status: DC | PRN
  Filled 2017-08-28: qty 1

## 2017-08-28 MED ORDER — ACETAMINOPHEN 325 MG PO TABS: 650.0000 mg | ORAL_TABLET | ORAL | Status: DC | PRN

## 2017-08-28 MED ORDER — HEPARIN SODIUM (PORCINE) 5000 UNIT/ML IJ SOLN
5000.0000 [IU] | Freq: Three times a day (TID) | INTRAMUSCULAR | Status: DC
  Administered 2017-08-29 – 2017-09-01 (×8): 5000 [IU] via SUBCUTANEOUS
  Filled 2017-08-28 (×8): qty 1

## 2017-08-28 MED ORDER — HYDRALAZINE HCL 20 MG/ML IJ SOLN
INTRAMUSCULAR | Status: AC
  Filled 2017-08-28: qty 1

## 2017-08-28 MED ORDER — OXYCODONE HCL 5 MG PO TABS
5.0000 mg | ORAL_TABLET | ORAL | Status: DC | PRN
  Administered 2017-08-28 – 2017-09-01 (×6): 10 mg via ORAL
  Filled 2017-08-28 (×6): qty 2

## 2017-08-28 MED ORDER — HEPARIN (PORCINE) IN NACL 1000-0.9 UT/500ML-% IV SOLN
INTRAVENOUS | Status: AC
  Filled 2017-08-28: qty 1000

## 2017-08-28 MED ORDER — FENTANYL CITRATE (PF) 100 MCG/2ML IJ SOLN
INTRAMUSCULAR | Status: DC | PRN
  Administered 2017-08-28: 50 ug via INTRAVENOUS
  Administered 2017-08-28 (×2): 25 ug via INTRAVENOUS
  Administered 2017-08-28: 50 ug via INTRAVENOUS

## 2017-08-28 MED ORDER — HEPARIN (PORCINE) IN NACL 2-0.9 UNITS/ML
INTRAMUSCULAR | Status: AC | PRN
  Administered 2017-08-28 (×2): 500 mL via INTRA_ARTERIAL

## 2017-08-28 MED ORDER — MIDAZOLAM HCL 2 MG/2ML IJ SOLN
INTRAMUSCULAR | Status: AC
  Filled 2017-08-28: qty 2

## 2017-08-28 MED ORDER — LIDOCAINE HCL (PF) 1 % IJ SOLN
INTRAMUSCULAR | Status: AC
  Filled 2017-08-28: qty 30

## 2017-08-28 MED ORDER — CLOPIDOGREL BISULFATE 300 MG PO TABS
ORAL_TABLET | ORAL | Status: DC | PRN
  Administered 2017-08-28: 300 mg via ORAL

## 2017-08-28 MED ORDER — FAMOTIDINE IN NACL 20-0.9 MG/50ML-% IV SOLN
INTRAVENOUS | Status: AC
  Filled 2017-08-28: qty 50

## 2017-08-28 MED ORDER — CLOPIDOGREL BISULFATE 75 MG PO TABS
75.0000 mg | ORAL_TABLET | Freq: Every day | ORAL | Status: DC
  Administered 2017-08-29 – 2017-09-01 (×4): 75 mg via ORAL
  Filled 2017-08-28 (×4): qty 1

## 2017-08-28 MED ORDER — LABETALOL HCL 5 MG/ML IV SOLN
INTRAVENOUS | Status: AC
  Filled 2017-08-28: qty 4

## 2017-08-28 MED ORDER — SODIUM CHLORIDE 0.9 % WEIGHT BASED INFUSION: 1.0000 mL/kg/h | INTRAVENOUS | Status: DC

## 2017-08-28 MED ORDER — HEPARIN SODIUM (PORCINE) 1000 UNIT/ML IJ SOLN
INTRAMUSCULAR | Status: DC | PRN
  Administered 2017-08-28: 4000 [IU] via INTRAVENOUS
  Administered 2017-08-28: 5000 [IU] via INTRAVENOUS
  Administered 2017-08-28: 2000 [IU] via INTRAVENOUS
  Administered 2017-08-28: 7000 [IU] via INTRAVENOUS

## 2017-08-28 MED ORDER — LIDOCAINE HCL (PF) 1 % IJ SOLN
INTRAMUSCULAR | Status: DC | PRN
  Administered 2017-08-28: 2 mL via INTRADERMAL
  Administered 2017-08-28: 20 mL via INTRADERMAL

## 2017-08-28 MED ORDER — ONDANSETRON HCL 4 MG/2ML IJ SOLN
4.0000 mg | Freq: Four times a day (QID) | INTRAMUSCULAR | Status: DC | PRN
  Administered 2017-08-29: 4 mg via INTRAVENOUS
  Filled 2017-08-28: qty 2

## 2017-08-28 MED ORDER — ANGIOPLASTY BOOK
Freq: Once | Status: AC
  Administered 2017-08-29: 01:00:00
  Filled 2017-08-28: qty 1

## 2017-08-28 MED ORDER — SODIUM CHLORIDE 0.9% FLUSH: 3.0000 mL | Freq: Two times a day (BID) | INTRAVENOUS | Status: DC

## 2017-08-28 MED ORDER — SODIUM CHLORIDE 0.9% FLUSH: 3.0000 mL | INTRAVENOUS | Status: DC | PRN

## 2017-08-28 MED ORDER — VANCOMYCIN HCL 500 MG IV SOLR
500.0000 mg | Freq: Two times a day (BID) | INTRAVENOUS | Status: DC
  Filled 2017-08-28 (×2): qty 500

## 2017-08-28 SURGICAL SUPPLY — 37 items
BALLN ADMIRAL INPACT 5X250 (BALLOONS) ×3
BALLOON ADMIRAL INPACT 5X250 (BALLOONS) IMPLANT
BAND CMPR LRG ZPHR (HEMOSTASIS) ×2
BAND ZEPHYR COMPRESS 30 LONG (HEMOSTASIS) ×1 IMPLANT
CATH CXI 2.6F 65 ST (CATHETERS) ×3
CATH MUSTANG 3X200X135 (BALLOONS) ×1 IMPLANT
CATH MUSTANG 3X20X135 (BALLOONS) ×1 IMPLANT
CATH NAVICROSS ANGLED 90CM (MICROCATHETER) ×1 IMPLANT
CATH OMNI FLUSH 5F 65CM (CATHETERS) ×1 IMPLANT
CATH QUICKCROSS .035X135CM (MICROCATHETER) ×1 IMPLANT
CATH QUICKCROSS SUPP .035X90CM (MICROCATHETER) ×1 IMPLANT
CATH SPRT STRG 65X2.6FR BRD (CATHETERS) IMPLANT
COVER PRB 48X5XTLSCP FOLD TPE (BAG) IMPLANT
COVER PROBE 5X48 (BAG) ×6
DEVICE ONE SNARE 10MM (MISCELLANEOUS) ×1 IMPLANT
DEVICE RAD COMP TR BAND LRG (VASCULAR PRODUCTS) ×1 IMPLANT
DEVICE TORQUE .025-.038 (MISCELLANEOUS) ×1 IMPLANT
GLIDEWIRE ADV .035X260CM (WIRE) ×1 IMPLANT
HEMOSTASIS PAD V PLUS (HEMOSTASIS) ×1 IMPLANT
KIT ENCORE 26 ADVANTAGE (KITS) ×1 IMPLANT
KIT MICROPUNCTURE NIT STIFF (SHEATH) ×1 IMPLANT
KIT PV (KITS) ×3 IMPLANT
SHEATH AVANTI 11CM 8FR (SHEATH) ×1 IMPLANT
SHEATH HIGHFLEX ANSEL 7FR 55CM (SHEATH) ×1 IMPLANT
SHEATH MICROPUNCTURE PEDAL 4FR (SHEATH) ×1 IMPLANT
SHEATH PINNACLE 5F 10CM (SHEATH) ×1 IMPLANT
SHEATH PINNACLE 6F 10CM (SHEATH) ×1 IMPLANT
SHIELD RADPAD SCOOP 12X17 (MISCELLANEOUS) ×1 IMPLANT
STENT INNOVA 5X150X130 (Permanent Stent) ×1 IMPLANT
STENT INNOVA 5X60X130 (Permanent Stent) ×1 IMPLANT
STENT TIGRIS 5X100X120 (Permanent Stent) ×1 IMPLANT
STOPCOCK MORSE 400PSI 3WAY (MISCELLANEOUS) ×1 IMPLANT
SYR MEDRAD MARK V 150ML (SYRINGE) ×3 IMPLANT
TRANSDUCER W/STOPCOCK (MISCELLANEOUS) ×3 IMPLANT
TRAY PV CATH (CUSTOM PROCEDURE TRAY) ×3 IMPLANT
WIRE BENTSON .035X145CM (WIRE) ×1 IMPLANT
WIRE G V18X300CM (WIRE) ×1 IMPLANT

## 2017-08-28 NOTE — Progress Notes (Signed)
Site area: left groin  Site Prior to Removal:  Level 0  Pressure Applied For 20 MINUTES    Minutes Beginning at 21:51  Manual:   Yes.    Patient Status During Pull:  WNL  Post Pull Groin Site:  Level 0  Post Pull Instructions Given:  Yes.    Post Pull Pulses Present:  Yes.    Dressing Applied:  Yes.    Comments: V/S stable. Pt tolerated well.

## 2017-08-28 NOTE — Progress Notes (Signed)
  Progress Note    08/28/2017 10:33 AM * No surgery date entered *  Subjective: No acute issues  Vitals:   08/28/17 0537 08/28/17 0737  BP: (!) 169/74 (!) 160/79  Pulse: 74 79  Resp: 18 18  Temp: 98.3 F (36.8 C) 98.1 F (36.7 C)  SpO2: 95% 97%    Physical Exam: Awake alert and oriented Nonlabored respirations Abdomen is soft nontender Bilateral palpable femoral pulses Well-healed amputation of toes on the left foot Dry gangrene tip of right great toe  CBC    Component Value Date/Time   WBC 6.5 08/25/2017 0542   RBC 3.73 (L) 08/25/2017 0542   HGB 10.9 (L) 08/25/2017 0542   HCT 32.9 (L) 08/25/2017 0542   HCT 30.7 (L) 07/27/2015 0601   PLT 189 08/25/2017 0542   MCV 88.2 08/25/2017 0542   MCH 29.2 08/25/2017 0542   MCHC 33.1 08/25/2017 0542   RDW 12.5 08/25/2017 0542   LYMPHSABS 1.5 08/11/2015 1723   MONOABS 0.5 08/11/2015 1723   EOSABS 0.5 08/11/2015 1723   BASOSABS 0.1 08/11/2015 1723    BMET    Component Value Date/Time   NA 140 08/27/2017 0811   K 3.8 08/27/2017 0811   CL 106 08/27/2017 0811   CO2 26 08/27/2017 0811   GLUCOSE 125 (H) 08/27/2017 0811   BUN 17 08/27/2017 0811   CREATININE 1.13 (H) 08/27/2017 0811   CALCIUM 9.5 08/27/2017 0811   GFRNONAA 55 (L) 08/27/2017 0811   GFRAA >60 08/27/2017 0811    INR    Component Value Date/Time   INR 1.10 08/03/2015 0833     Intake/Output Summary (Last 24 hours) at 08/28/2017 1033 Last data filed at 08/28/2017 0600 Gross per 24 hour  Intake 1100 ml  Output 0 ml  Net 1100 ml     Assessment:  52 y.o. 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  She has previous left lower extremity revascularization.  Plan: Angiogram today from left common femoral artery to evaluate right lower extremity.  She has previously had vein harvested from the right.  I discussed the risk benefits and alternatives to proceeding and she agrees to proceed as does her husband.   Marckus Hanover C. Randie Heinz,  MD [facility] [place] Office: [phone] Pager: [phone]  08/28/2017 10:33 AM

## 2017-08-28 NOTE — Progress Notes (Signed)
Pharmacy Antibiotic Note  Isabel [name] is a 52 y.o. female admitted on 08/24/2017 with cellulitis and wound infection (possible diabetic foot). .  Pharmacy has been consulted for Vancomycin and Zosyn dosing.  A Vancomycin trough this morning resulted as therapeutic (VT 17 mcg/ml, goal of 15-20 mcg/ml). Will continue current dosing.   Plan: - Continue Vancomycin 500 mg IV every 12 hours - Continue Zosyn 3.375g IV every 8 hours (infused over 4 hours) - Will continue to follow renal function, culture results, LOT, and antibiotic de-escalation plans   Height: 5\' 1"  (154.9 cm) Weight: 159 lb 6.3 oz (72.3 kg) IBW/kg (Calculated) : 47.8  Temp (24hrs), Avg:98.3 F (36.8 C), Min:98.1 F (36.7 C), Max:98.8 F (37.1 C)  Recent Labs  Lab 08/24/17 1152 08/25/17 0542 08/27/17 0811 08/28/17 0400  WBC 6.4 6.5  --   --   CREATININE 0.91 1.28* 1.13*  --   VANCOTROUGH  --   --   --  17    Estimated Creatinine Clearance: 53 mL/min (A) (by C-G formula based on SCr of 1.13 mg/dL (H)).    Allergies  Allergen Reactions  . No Known Allergies     Antimicrobials this admission: Vanc 5/12 >>  Zosyn 5/12 >>   Dose adjustments this admission: 5/13 Decrease Vancomycin 500mg  IV every 12 hours. 5/16: VT 17 mcg/ml - continue current dose 500 mg/12h  Microbiology results: no cultures as of now  Thank you for allowing pharmacy to be a part of this patient's care.  Georgina Pillion, PharmD, BCPS Clinical Pharmacist Pager: [phone] Clinical phone for 08/28/2017 from 7a-3:30p: [phone] If after 3:30p, please call main pharmacy at: [phone] 08/28/2017 8:25 AM

## 2017-08-28 NOTE — Progress Notes (Signed)
Pt gone to cath-lab for arteriogram.  Pt will not be coming back to this floor.  Husband gathered and took belongings with him.

## 2017-08-28 NOTE — Progress Notes (Signed)
[facility] BAND REMOVAL  LOCATION:    right pedal  DEFLATED PER PROTOCOL:    Yes.    TIME BAND OFF / DRESSING APPLIED:    22:45   SITE UPON ARRIVAL:    Level 0  SITE AFTER BAND REMOVAL:    Level 0  CIRCULATION SENSATION AND MOVEMENT:    Within Normal Limits   Yes.    COMMENTS:   Post band removal instructions given. Pt tolerated well.

## 2017-08-28 NOTE — Progress Notes (Addendum)
Discharge planning Services  CM Consult, [facility], [facility] Program  [facility] Program will assist with patient medications ($500 limit), patient will be advised use is available once per 12 months and does not cover diabetic supplies or over the counter medications or vitamins.    Patient will be instructed to make a appointment with [facility]) on Monday morning first thing since this is the only time they open their schedule to make appointments.   Review of patient chart indicates the following: Patient says she has applied for Medicaid in past 3 months and was denied as over reserve/resources and does not know the name of her caseworker; Financial counselor notes state per DSS: DOS [phone] IC Called caseworker Jerelyn Charles of [employer] [phone] extension [phone] who stated that she is caseworker for the patient and the patient was denied as over reserve. Patient is married and husband does receive assistance.  NCM Called attempted to set up new patient appointment at [facility], was told patient is required to call and set up appointment themselves at [phone]. NCM will provide information to patient.  In to speak with patient, husband at bedside (permission given to speak in front of husband given by patient) Discussed the PCP issue with patient.  NCM provided patient with information for [facility], asked patient to please call and schedule appointment before discharge.  Patient verbalized understanding.  Also discussed medication assistance program MATCH with patient, advised that does not cover Diabetic supplies, OTC drugs or vitamins; limited usage of program of once every 12 months.  Patient states she could afford the Match Program Co-pay for prescriptions.  Patient verbalized understanding.  Patient states she has the following at home: Glucose monitor, strips, lancets, alcohol wipes and syringes.

## 2017-08-28 NOTE — Progress Notes (Signed)
[facility] PROGRESS NOTE  Isabel [name] GBE:[id number] DOB: Dec 07, 1965 DOA: 08/24/2017  PCP: [name], MD  Brief History/Interval Summary: 52 year old female with a history of diabetes, hypertension, known peripheral vascular disease status post left femoropopliteal bypass and iliofemoral endarterectomy, presents with right great toe ulcer with possible underlying osteomyelitis.  Patient was admitted to [facility] and started on intravenous antibiotics.  ABIs noted to be 0.4 on the right.  Case discussed with vascular surgery who recommended transfer to [facility] for arteriogram.   Reason for Visit: Right great toe ulcer with possible underlying osteomyelitis with peripheral vascular disease  Consultants: Vascular surgery  Procedures: None yet  Antibiotics: Vancomycin and Zosyn  Subjective/Interval History: Patient states that the pain in her right great toe has improved.  She feels that the toe looks better as well.  Denies any nausea vomiting.  No shortness of breath.    ROS: Denies any chest pain.  Objective:  Vital Signs  Vitals:   08/27/17 1551 08/27/17 2204 08/28/17 0537 08/28/17 0737  BP: 139/66 (!) 160/70 (!) 169/74 (!) 160/79  Pulse: 80 84 74 79  Resp: 18 18 18 18   Temp: 98.2 F (36.8 C) 98.8 F (37.1 C) 98.3 F (36.8 C) 98.1 F (36.7 C)  TempSrc: Oral Oral Oral Oral  SpO2: 95% 97% 95% 97%  Weight:   72.3 kg (159 lb 6.3 oz)   Height:        Intake/Output Summary (Last 24 hours) at 08/28/2017 0905 Last data filed at 08/28/2017 0600 Gross per 24 hour  Intake 1340 ml  Output 0 ml  Net 1340 ml   Filed Weights   08/25/17 0606 08/26/17 2203 08/28/17 0537  Weight: 69.2 kg (152 lb 8.9 oz) 70.7 kg (155 lb 14.4 oz) 72.3 kg (159 lb 6.3 oz)    General appearance: Awake alert.  In no distress. Resp: Clear to auscultation bilaterally.  No wheezing rales or rhonchi. Cardio: S1-S2 is normal regular.  No S3-S4.  No rubs murmurs or  bruit GI: Abdomen is soft.  Nontender nondistended.  Bowel sounds are present.  No masses no organomegaly Extremities: Ulcer noted over the first toe on the right.  No drainage noted.  Poor pulsations on the right foot compared to left. Neurologic: No obvious focal neurological deficits.  Lab Results:  Data Reviewed: I have personally reviewed following labs and imaging studies  CBC: Recent Labs  Lab 08/24/17 1152 08/25/17 0542  WBC 6.4 6.5  HGB 12.3 10.9*  HCT 36.7 32.9*  MCV 88.4 88.2  PLT 208 189    Basic Metabolic Panel: Recent Labs  Lab 08/24/17 1152 08/25/17 0542 08/27/17 0811  NA 132* 135 140  K 4.2 3.8 3.8  CL 96* 104 106  CO2 27 28 26   GLUCOSE 570* 199* 125*  BUN 18 22* 17  CREATININE 0.91 1.28* 1.13*  CALCIUM 9.4 8.8* 9.5    GFR: Estimated Creatinine Clearance: 53 mL/min (A) (by C-G formula based on SCr of 1.13 mg/dL (H)).  Liver Function Tests: Recent Labs  Lab 08/25/17 0542  AST 11*  ALT 12*  ALKPHOS 68  BILITOT 0.6  PROT 5.3*  ALBUMIN 2.0*    CBG: Recent Labs  Lab 08/26/17 2007 08/27/17 0831 08/27/17 1132 08/27/17 1644 08/27/17 2203  GLUCAP 178* 121* 242* 273* 274*    Radiology Studies: No results found.   Medications:  Scheduled: . aspirin EC  81 mg Oral Daily  . atorvastatin  80 mg Oral q1800  .  carvedilol  3.125 mg Oral BID WC  . enoxaparin (LOVENOX) injection  40 mg Subcutaneous Q24H  . insulin aspart  0-15 Units Subcutaneous TID WC  . insulin detemir  40 Units Subcutaneous QHS  . lisinopril  10 mg Oral Daily  . pantoprazole  40 mg Oral Daily   Continuous: . sodium chloride 10 mL/hr at 08/24/17 1758  . piperacillin-tazobactam (ZOSYN)  IV 3.375 g (08/28/17 0537)  . vancomycin Stopped (08/28/17 0524)   NWG:NFAOZHYQMVHQI **OR** acetaminophen, ALPRAZolam, hydrALAZINE, ondansetron **OR** ondansetron (ZOFRAN) IV, polyethylene glycol, traMADol  Assessment/Plan:    Right great toe osteomyelitis with underlying  peripheral vascular disease Patient with previous history of arteriogram with left iliofemoral endarterectomy and left femoropopliteal bypass.  She does complain of claudication symptoms in her lower extremities.  Ulcer on right great toe has been present for approximately a week.  Plain films indicate possible underlying osteomyelitis.  She is currently on IV antibiotics with vancomycin and Zosyn.  Unfortunately no cultures were sent at the time of admission or since.  She has been on antibiotics now for 4 days.  Will wait to see what vascular studies suggest and what vascular surgery plans to do depending on results of the study and will then decide on antibiotic changes.  Arterial Doppler showed ABI of 0.4 on right.  Arteriogram is scheduled for today.  Continue aspirin.  Type II diabetes with vascular complications Blood sugars are currently stable.  Continue on Levemir and sliding scale insulin.  Continue to monitor closely for now.    Essential Hypertension Blood pressures noted to be elevated at times.  Continue carvedilol and lisinopril.  If pressures remain elevated there is room to go up on the carvedilol.    Hyperlipidemia Continue statin  Mild acute renal failure Mildly elevated BUN and creatinine were noted to admission.  Noted to be slightly better yesterday.  Repeat labs pending as of this morning.  Continue to monitor urine output.  Normocytic anemia Drop in hemoglobin is likely dilutional.  Continue to monitor.  Recheck tomorrow morning.  History of coronary artery disease Stable.  DVT Prophylaxis: Lovenox    Code Status: Full code Family Communication: Discussed with the patient Disposition Plan: Management as outlined above.    LOS: 4 days   [name]  [facility] Pager [phone] 08/28/2017, 9:05 AM  If 7PM-7AM, please contact night-coverage at [email or web address], password [facility]

## 2017-08-28 NOTE — Progress Notes (Signed)
Inpatient Diabetes Program Recommendations  AACE/ADA: New Consensus Statement on Inpatient Glycemic Control (2019)  Target Ranges:  Prepandial:   less than 140 mg/dL      Peak postprandial:   less than 180 mg/dL (1-2 hours)      Critically ill patients:  140 - 180 mg/dL   Results for Isabel [name], Isabel [name] (MRN [id number]) as of 08/28/2017 08:36  Ref. Range 08/27/2017 08:31 08/27/2017 11:32 08/27/2017 16:44 08/27/2017 22:03  Glucose-Capillary Latest Ref Range: 65 - 99 mg/dL 825 (H) 053 (H) 976 (H) 274 (H)    Review of Glycemic Control  Diabetes history: DM2 Outpatient Diabetes medications: Levemir 40 units QAM, 20 units mid-day, 40 units QPM (for total of 100 units per day), Humalog as needed Current orders for Inpatient glycemic control: Levemir 40 units QHS, Novolog 0-15 units TID with meals  Inpatient Diabetes Program Recommendations: Insulin-Meal Coverage: While inpatient, please consider ordering Novolog 5 units TID with meals for meal coverage if patient eats at least 50% of meals. Insulin-Correction: Please consider ordering Novolog 0-5 units QHS for bedtime correction. HgbA1C: A1C 10.9% on 05/22/17 indicating an average glucose of 266 mg/dl.    Outpatient Insulin regimen: Please consider discharging patient on NOVOLIN 70/30 which would be more affordable for patient. Would recommend 70/30 28 units BID (which will provide a total of 40 units for basal and 16 units for MC per day).   NOTE: Diabetes Coordinator spoke with patient at length on 08/25/17 and patient reports that she does not have any insurance (despite current insurance listed in chart as Medicaid) and she can not afford to get insulin or go to PCP for follow up. Placed consult for CM to verify if patient has insurance or not. If not, patient will need to be transitioned to NOVOLIN 70/30 insulin for outpatient DM control and will need assistance with arranging follow up care.  Thanks, Orlando Penner, RN, MSN, CDE Diabetes  Coordinator Inpatient Diabetes Program [phone] (Team Pager from 8am to 5pm)

## 2017-08-29 ENCOUNTER — Encounter (HOSPITAL_COMMUNITY): Payer: Self-pay | Admitting: Vascular Surgery

## 2017-08-29 ENCOUNTER — Telehealth: Payer: Self-pay | Admitting: Vascular Surgery

## 2017-08-29 DIAGNOSIS — N179 Acute kidney failure, unspecified: Secondary | ICD-10-CM

## 2017-08-29 LAB — GLUCOSE, CAPILLARY
GLUCOSE-CAPILLARY: 228 mg/dL — AB (ref 65–99)
GLUCOSE-CAPILLARY: 186 mg/dL — AB (ref 65–99)
Glucose-Capillary: 149 mg/dL — ABNORMAL HIGH (ref 65–99)
Glucose-Capillary: 180 mg/dL — ABNORMAL HIGH (ref 65–99)

## 2017-08-29 LAB — CBC
HCT: 28.5 % — ABNORMAL LOW (ref 36.0–46.0)
HEMOGLOBIN: 9.5 g/dL — AB (ref 12.0–15.0)
MCH: 29.3 pg (ref 26.0–34.0)
MCHC: 33.3 g/dL (ref 30.0–36.0)
MCV: 88 fL (ref 78.0–100.0)
Platelets: 201 10*3/uL (ref 150–400)
RBC: 3.24 MIL/uL — AB (ref 3.87–5.11)
RDW: 12.6 % (ref 11.5–15.5)
WBC: 7.9 10*3/uL (ref 4.0–10.5)

## 2017-08-29 LAB — POCT ACTIVATED CLOTTING TIME
ACTIVATED CLOTTING TIME: 219 s
ACTIVATED CLOTTING TIME: 274 s
ACTIVATED CLOTTING TIME: 180 s
ACTIVATED CLOTTING TIME: 158 s
Activated Clotting Time: 241 seconds

## 2017-08-29 LAB — BASIC METABOLIC PANEL
Anion gap: 9 (ref 5–15)
BUN: 22 mg/dL — AB (ref 6–20)
CHLORIDE: 106 mmol/L (ref 101–111)
CO2: 25 mmol/L (ref 22–32)
Calcium: 8.4 mg/dL — ABNORMAL LOW (ref 8.9–10.3)
Creatinine, Ser: 1.62 mg/dL — ABNORMAL HIGH (ref 0.44–1.00)
GFR calc Af Amer: 41 mL/min — ABNORMAL LOW (ref >60)
GFR calc non Af Amer: 36 mL/min — ABNORMAL LOW (ref >60)
GLUCOSE: 171 mg/dL — AB (ref 65–99)
POTASSIUM: 3.5 mmol/L (ref 3.5–5.1)
Sodium: 140 mmol/L (ref 135–145)

## 2017-08-29 MED ORDER — POTASSIUM CHLORIDE CRYS ER 20 MEQ PO TBCR
20.0000 meq | EXTENDED_RELEASE_TABLET | Freq: Once | ORAL | Status: DC
  Filled 2017-08-29: qty 1

## 2017-08-29 MED ORDER — SODIUM CHLORIDE 0.9 % IV SOLN
INTRAVENOUS | Status: AC
  Administered 2017-08-30: 06:00:00 via INTRAVENOUS

## 2017-08-29 MED ORDER — AMOXICILLIN-POT CLAVULANATE 875-125 MG PO TABS
1.0000 | ORAL_TABLET | Freq: Two times a day (BID) | ORAL | Status: DC
  Administered 2017-08-29 (×2): 1 via ORAL
  Filled 2017-08-29 (×3): qty 1

## 2017-08-29 MED ORDER — SODIUM CHLORIDE 0.9 % IV BOLUS
500.0000 mL | Freq: Once | INTRAVENOUS | Status: AC
  Administered 2017-08-29: 09:00:00 500 mL via INTRAVENOUS

## 2017-08-29 MED FILL — Famotidine in NaCl 0.9% IV Soln 20 MG/50ML: INTRAVENOUS | Qty: 50 | Status: AC

## 2017-08-29 MED FILL — Heparin Sod (Porcine)-NaCl IV Soln 1000 Unit/500ML-0.9%: INTRAVENOUS | Qty: 1000 | Status: AC

## 2017-08-29 MED FILL — Labetalol HCl IV Soln Prefilled Syringe 20 MG/4ML (5 MG/ML): INTRAVENOUS | Qty: 4 | Status: AC

## 2017-08-29 NOTE — Progress Notes (Signed)
Patient reported noting a knot on the side of her right leg. Assessed and noted large area, lateral to right knee, appears to be a hematoma with slight bruising which appears deep in the tissue. No pain with palpation. Texted paged Dr. Rito Ehrlich and he instructed me to call vascular. Spoke with Dr. Arbie Cookey who was familiar with the patient and the procedure performed on her yesterday. Stated it was not unusual to have this happen it is related to bleeding deep in tissue during procedure that shows up at the surface later. It may get larger and that would also be expected after this procedure. Relayed information to patient with good verbalized understanding and instructed her to report any noted changes to nursing so we could continue to monitor. Patient agreed. Resting comfortably with no change remainder of my shift.

## 2017-08-29 NOTE — Progress Notes (Signed)
[facility] PROGRESS NOTE  Isabel [name] NID:[id number] DOB: 1965/09/05 DOA: 08/24/2017  PCP: Isabel Percy, MD  Brief History/Interval Summary: 52 year old female with a history of diabetes, hypertension, known peripheral vascular disease status post left femoropopliteal bypass and iliofemoral endarterectomy, presents with right great toe ulcer with possible underlying osteomyelitis.  Patient was admitted to [facility] and started on intravenous antibiotics.  ABIs noted to be 0.4 on the right.  Case discussed with vascular surgery who recommended transfer to [facility] for arteriogram.   Reason for Visit: Right great toe ulcer with possible underlying osteomyelitis with peripheral vascular disease  Consultants: Vascular surgery  Procedures:   1.  Ultrasound-guided cannulation left common femoral artery 2.  Aortogram with bilateral lower extremity runoff 3.  Ultrasound-guided cannulation right posterior tibial artery 4.  Stent of SFA takeoff with 5 x 60 mm Innova 5.  Stent of distal SFA proximal popliteal with 5 x 150 mm Innova 6.  Stent of popliteal artery with Gore Tigris 5 x 179m terminating just distal to the joint space 7.  Plain balloon angioplasty of posterior tibial artery with 3 mm balloon 8.  Drug-coated balloon angioplasty of right SFA and popliteal arteries with in.pact 5 x 250 mm balloon 9.  Moderate sedation with fentanyl and Versed 160 minutes  Antibiotics: Vancomycin and Zosyn started on admission.  Will be changed over to Augmentin today.  Subjective/Interval History: Patient states that she is feeling better.  Had a lot of pain after her procedure last night.  Denies any new complaints.    ROS: Denies any shortness of breath.  No nausea or vomiting.  Objective:  Vital Signs  Vitals:   08/29/17 0400 08/29/17 0500 08/29/17 0609 08/29/17 0833  BP: (!) 90/37 (!) 97/43 (!) 98/46 109/62  Pulse: 80 81 80 81  Resp: _0 (!) 22  Temp:     97.7 F (36.5 C)  TempSrc:    Oral  SpO2: 93% 93% 94% 97%  Weight:      Height:        Intake/Output Summary (Last 24 hours) at 08/29/2017 1050 Last data filed at 08/29/2017 0700 Gross per 24 hour  Intake 858.4 ml  Output 700 ml  Net 158.4 ml   Filed Weights   08/26/17 2203 08/28/17 0537 08/29/17 0245  Weight: 70.7 kg (155 lb 14.4 oz) 72.3 kg (159 lb 6.3 oz) 74.1 kg (163 lb 5.8 oz)    General appearance: Awake alert.  In no distress. Resp: Clear to auscultation bilaterally.  No wheezing rales or rhonchi. Cardio: S1-S2 is normal regular.  No S3-S4.  No rubs murmurs or bruit GI: Abdomen remains soft.  Nontender nondistended Extremities: Ulcer noted over the first toe on the right.  No drainage noted. Neurologic: No focal neurological deficits.  Lab Results:  Data Reviewed: I have personally reviewed following labs and imaging studies  CBC: Recent Labs  Lab 08/24/17 1152 08/25/17 0542 08/28/17 2041 08/29/17 0230  WBC 6.4 6.5 10.0 7.9  HGB 12.3 10.9* 11.1* 9.5*  HCT 36.7 32.9* 33.1* 28.5*  MCV 88.4 88.2 87.3 88.0  PLT 208 189 201 2144   Basic Metabolic Panel: Recent Labs  Lab 08/24/17 1152 08/25/17 0542 08/27/17 0811 08/28/17 1019 08/28/17 2041 08/29/17 0230  NA 132* 135 140 141  --  140  K 4.2 3.8 3.8 3.9  --  3.5  CL 96* 104 106 104  --  106  CO2 _1 --  25  GLUCOSE 570* 199* 125* 180*  --  171*  BUN 18 22* 17 16  --  22*  CREATININE 0.91 1.28* 1.13* 1.30* 1.29* 1.62*  CALCIUM 9.4 8.8* 9.5 9.1  --  8.4*    GFR: Estimated Creatinine Clearance: 37.4 mL/min (A) (by C-G formula based on SCr of 1.62 mg/dL (H)).  Liver Function Tests: Recent Labs  Lab 08/25/17 0542  AST 11*  ALT 12*  ALKPHOS 68  BILITOT 0.6  PROT 5.3*  ALBUMIN 2.0*    CBG: Recent Labs  Lab 08/28/17 0735 08/28/17 1145 08/28/17 1900 08/28/17 2059 08/29/17 0621  GLUCAP 191* 148* 149* 164* 149*    Radiology Studies: No results found.   Medications:    Scheduled: . aspirin EC  81 mg Oral Daily  . atorvastatin  80 mg Oral q1800  . carvedilol  3.125 mg Oral BID WC  . clopidogrel  75 mg Oral Q breakfast  . heparin  5,000 Units Subcutaneous Q8H  . insulin aspart  0-15 Units Subcutaneous TID WC  . insulin detemir  40 Units Subcutaneous QHS  . lisinopril  10 mg Oral Daily  . pantoprazole  40 mg Oral Daily  . sodium chloride flush  3 mL Intravenous Q12H   Continuous: . sodium chloride Stopped (08/28/17 2200)  . sodium chloride    . sodium chloride    . piperacillin-tazobactam (ZOSYN)  IV 3.375 g (08/29/17 0418)  . sodium chloride    . vancomycin     OQH:UTMLYY chloride, [DISCONTINUED] acetaminophen **OR** acetaminophen, acetaminophen, ALPRAZolam, hydrALAZINE, hydrALAZINE, HYDROmorphone (DILAUDID) injection, labetalol, ondansetron **OR** ondansetron (ZOFRAN) IV, ondansetron (ZOFRAN) IV, oxyCODONE, polyethylene glycol, sodium chloride flush, traMADol  Assessment/Plan:    Right great toe osteomyelitis with underlying peripheral vascular disease Patient with previous history of arteriogram with left iliofemoral endarterectomy and left femoropopliteal bypass.  She mentioned claudication symptoms in her lower extremities.  Ulcer on right great toe has been present for approximately a week.  Plain films indicated possible underlying osteomyelitis.  Patient was started on IV antibiotics with vancomycin and Zosyn. Unfortunately no cultures were sent at the time of admission or since.  Patient underwent vascular procedure with angioplasty and stent placement to right lower extremity.  No further surgeries are planned.  Plavix has been added which she will need to take for 3 months.  Continue aspirin.  Discussed with infectious disease regarding treatment for the osteomyelitis.  We will change her to oral Augmentin.  We will check a ESR tomorrow.  This will need to be followed as an outpatient.    Type II diabetes with vascular complications Blood  sugars are currently stable.  Continue on current dose of Levemir and sliding scale insulin.  Continue to monitor closely for now.    Essential Hypertension Pressures have been low since last night.  We will hold her lisinopril and her hydralazine.  We will give her a fluid bolus.  No overt blood loss is noted.  Okay to continue carvedilol for now.  She is asymptomatic.    Hyperlipidemia Continue statin  Mild acute renal failure Mildly elevated BUN and creatinine were noted to admission.  They did improve but noted to be higher today.  Creatinine is 1.62.  Could be related to her procedure and contrast.  We will give her a fluid bolus and started on IV fluids.  Recheck labs tomorrow.  She is making urine.    Normocytic anemia Drop in hemoglobin is likely dilutional.  Continue to monitor.  No hematoma noted in the groin area.  History of coronary artery disease Stable.  Continue beta-blocker and aspirin.  DVT Prophylaxis: Lovenox    Code Status: Full code Family Communication: Discussed with the patient Disposition Plan: Mobilize as tolerated.  IV fluids.  Repeat labs tomorrow.  If stable, she could potentially go home tomorrow.    LOS: 5 days   [place] [facility] Pager [phone] 08/29/2017, 10:50 AM  If 7PM-7AM, please contact night-coverage at [email or web address], password [facility]

## 2017-08-29 NOTE — Telephone Encounter (Signed)
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  ALLAIRE MCKIBBIN [id number] 14-Mar-1966  08/28/2017 Pre-operative Diagnosis: Critical right lower extremity ischemia  Surgeon:  Apolinar Junes C. Randie Heinz, MD  Procedure Performed: 1.  Ultrasound-guided cannulation left common femoral artery 2.  Aortogram with bilateral lower extremity runoff 3.  Ultrasound-guided cannulation right posterior tibial artery 4.  Stent of SFA takeoff with 5 x 60 mm Innova 5.  Stent of distal SFA proximal popliteal with 5 x 150 mm Innova 6.  Stent of popliteal artery with Gore Tigris 5 x terminating just distal to the joint space 7.  Plain balloon angioplasty of posterior tibial artery with 3 mm balloon 8.  Drug-coated balloon angioplasty of right SFA and popliteal arteries with in.pact 5 x 250 mm balloon 9.  Moderate sedation with fentanyl and Versed 160 minutes  F/u in 4-6 weeks with me. RLE duplex and bilateral ABI

## 2017-08-29 NOTE — Progress Notes (Addendum)
NCM scheduled follow up apt at [facility] for patient on 6/12 at 1 pm, Match letter sent to [facility] for patient.

## 2017-08-29 NOTE — Progress Notes (Signed)
  Progress Note    08/29/2017 8:47 AM 1 Day Post-Op  Subjective: No acute issues  Vitals:   08/29/17 0609 08/29/17 0833  BP: (!) 98/46 109/62  Pulse: 80 81  Resp: 11 (!) 22  Temp:  97.7 F (36.5 C)  SpO2: 94% 97%    Physical Exam: Awake alert and oriented Nonlabored respirations Abdomen is soft and nontender Left groin without hematoma Right ankle without hematoma Strong posterior tibial signal beyond cannulation site Strong first web interspace signal Stable gangrenous changes that are dry of right great toe  CBC    Component Value Date/Time   WBC 7.9 08/29/2017 0230   RBC 3.24 (L) 08/29/2017 0230   HGB 9.5 (L) 08/29/2017 0230   HCT 28.5 (L) 08/29/2017 0230   HCT 30.7 (L) 07/27/2015 0601   PLT 201 08/29/2017 0230   MCV 88.0 08/29/2017 0230   MCH 29.3 08/29/2017 0230   MCHC 33.3 08/29/2017 0230   RDW 12.6 08/29/2017 0230   LYMPHSABS 1.5 08/11/2015 1723   MONOABS 0.5 08/11/2015 1723   EOSABS 0.5 08/11/2015 1723   BASOSABS 0.1 08/11/2015 1723    BMET    Component Value Date/Time   NA 140 08/29/2017 0230   K 3.5 08/29/2017 0230   CL 106 08/29/2017 0230   CO2 25 08/29/2017 0230   GLUCOSE 171 (H) 08/29/2017 0230   BUN 22 (H) 08/29/2017 0230   CREATININE 1.62 (H) 08/29/2017 0230   CALCIUM 8.4 (L) 08/29/2017 0230   GFRNONAA 36 (L) 08/29/2017 0230   GFRAA 41 (L) 08/29/2017 0230    INR    Component Value Date/Time   INR 1.10 08/03/2015 0833     Intake/Output Summary (Last 24 hours) at 08/29/2017 0847 Last data filed at 08/29/2017 0700 Gross per 24 hour  Intake 858.4 ml  Output 700 ml  Net 158.4 ml     Assessment:  52 y.o. female is s/p:  1.  Ultrasound-guided cannulation left common femoral artery 2.  Aortogram with bilateral lower extremity runoff 3.  Ultrasound-guided cannulation right posterior tibial artery 4.  Stent of SFA takeoff with 5 x 60 mm Innova 5.  Stent of distal SFA proximal popliteal with 5 x 150 mm Innova 6.  Stent of  popliteal artery with Gore Tigris 5 x <MEASUREMENAdministracion De Servicios Medicos De Pr ([id number] [address] [facility] JeannettJoaqMarland Kitche8 [phone]-[facility] ComplJeannettJoaqMarland Kitche8 [phone]-[facility] WeatherfoJeannettJoaqMarland Kitche8 [phone]-[facility] CentJeannettJoaqMarland Kitche8 [facility] [phone]-Surgery Center LJeannettJoaqMarland Kitche8 [phone]-Lac/[facility] CentJeannettJoaqMarland Kitche8 [phone] InstituJeannettJoaqMarland Kitche8 [phone] CentJeannettJoaqMarland Kitche8 [facility] [phone] Surgery CentJeannettJoaqMarland Kitche8 [facility] [id number] PoiJeannettJoaqMarland Kitche8<[phone]-[facility] CenteJoa8 [phone]-[facility] MaJeannettJoaqMarland Kitche8 [facility] [phone]-[facility] HospitJeannettJoaqMarland Kitche8 [phone]-[facility] CentJeannettJoaqMarland Kitche8 [phone]-[facility] PJeannettJoaqMarland Kitche8<MEASUREMEN802-858 [phone]-[facility] FloJeannettJoaqMarland Kitche8 [phone]-[facility] HospitJeannettJoaqMarland Kitche8 [facility] [phone]-[facility] CentJeannettJoaqMarland Kitche8 [phone]-[facility] LJeannettJoaqMarland Kitche8<MEASUREMEN([phone]-[place] [facility] CJ [facility] [phone]-AvaJeannettJoaqMarland Kitche8 [facility] [phone]-[facility]-JeannettJoaqMarland Kitche85885in Courtsing just distal to the joint space 7.  Plain balloon angioplasty of posterior tibial artery with 3 mm balloon 8.  Drug-coated balloon angioplasty of right SFA and popliteal arteries with in.pact 5 x 250 mm balloon 9.  Moderate sedation with fentanyl and Versed 160 minutes   Plan: She will need Plavix as an outpatient for at least 3 months but would probably benefit for longer dual antiplatelet therapy I will have her follow-up in 4 to 6 weeks with duplex and ABI Okay for discharge from vascular standpoint.   Brandon C. Cain, MD [facility] [place] Office: [phone] Pager: [phone]  08/29/2017 8:47 AM

## 2017-08-29 NOTE — Op Note (Signed)
Patient name: Isabel [name] MRN: [id number] DOB: 11-16-1965 Sex: female  08/28/2017 Pre-operative Diagnosis: Critical right lower extremity ischemia Post-operative diagnosis:  Same Surgeon:  Erlene Quan C. Donzetta Matters, MD Procedure Performed: 1.  Ultrasound-guided cannulation left common femoral artery 2.  Aortogram with bilateral lower extremity runoff 3.  Ultrasound-guided cannulation right posterior tibial artery 4.  Stent of SFA takeoff with 5 x 60 mm Innova 5.  Stent of distal SFA proximal popliteal with 5 x 150 mm Innova 6.  Stent of popliteal artery with Gore Tigris 5 x 174m terminating just distal to the joint space 7.  Plain balloon angioplasty of posterior tibial artery with 3 mm balloon 8.  Drug-coated balloon angioplasty of right SFA and popliteal arteries with in.pact 5 x 250 mm balloon 9.  Moderate sedation with fentanyl and Versed 160 minutes   Indications: 51year old female with a history of critical left lower extremity ischemia having undergone left femoral to below-knee popliteal artery bypass with PTFE.  She now has ulceration on her right great toe and is indicated for angiogram with possible intervention of the right lower extremity.  Findings: The left groin has significant scarring that required dilatation as well as stiff wire for access.  The aorta appears to have disease in the distal aspect but is non-flow-limiting.  The aortic bifurcation is very small and required a flexible sheath to get up and over the bifurcation.  On the left side the femoral to popliteal artery bypass graft is patent and peroneal artery is the dominant runoff to the foot.  On the right side which is the area of concern there is very diminutive SFA proximally which occludes in the mid thigh.  There is then a island of SFA followed by another occlusion with reconstitution at the level of the knee popliteal.  The peroneal artery is the dominant runoff and the posterior tibial artery also runs off but is  nearly occluded in its mid segment.  I completion the PT artery is the dominant runoff and the stenting of the SFA and popliteal arteries demonstrate no flow-limiting stenosis or dissection were once it was occluded.  The popliteal artery stent is very difficult to see and does terminate just distal to the knee joint as mentioned above.  There is below knee target with two-vessel runoff should the patient need bypass in the future.   Procedure:  The patient was identified in the holding area and taken to room 8.  The patient was then placed supine on the table and prepped and draped in the usual sterile fashion.  A time out was called.  Ultrasound was used to evaluate the left common femoral artery and the area was anesthetized and this was cannulated with micropuncture needle.  The artery was noted to be patent and an image was saved to the chart for the permanent record.  He was cannulated under direct visualization with the ultrasound.  There was significant scar tissue this was very difficult.  We then able to pass a micropuncture wire and placed the sheath.  We then placed a Glidewire advantage and dilated with a 6 dilator and placed a 5 French sheath.  An Omni catheter was then placed to the level of L1 aortogram was performed followed by bilateral lower extremity runoff with the above findings.  We then crossed the bifurcation using the Omni catheter and Glidewire advantage placed the wire into the proximal SFA.  We then dilated with an 8 mm dilator and placed a 7  high flex sheath up and over the bifurcation and the patient was heparinized.  Throughout the case she received additional heparin to get the ACT greater than 250.  This time we used a quick cross catheter and Glidewire advantage to try to traverse the occluded SFA.  We did get in the subintimal plane and could not get back in.  With this I very quickly made the determination to go in a retrograde approach.  Ultrasound was used to cannulate the  posterior tibial artery.  An image was saved in the chart.  Posterior tibial arteries noted to be patent although nonpulsatile given lack of antegrade flow.  He was cannulated directly under ultrasound guidance.  A micropuncture sheath was placed over the wire.  We then used a V 18 wire initially followed by CXI catheter to try to traverse the occluded popliteal and SFA segment.  Unfortunately there was not enough body to the wire and catheter segment.  I then exchanged for the V 18 from above in the Glidewire advantage from below.  I tried crossing with a quick cross catheter 035 from below and this was bareback with no sheath in place.  I then balloon the SFA in the mid segment from above with a 3 mm balloon and attempted to use the back and of the guidewire advanced from below to cross through.  I was unable to get the balloon to pop with the back of the wire.  However on taking a new angiogram there did appear to be a connected segment.  I was unable to get the Glidewire advantage back from above and I snared this at the level of just above the knee popliteal pulled this all the way through.  I then used a 3 mm long balloon to dilate first posterior tibial artery followed by the popliteal all the way up into the SFA.  Completion angiogram demonstrated significant spiral dissection in the SFA and popliteal.  A 5 mm drug-coated balloon was placed to nominal pressure for 3 minutes.  Completion still demonstrated spinal dissection.  This was then inflated for 3 minutes at low pressure 3 atm.  Completion demonstrated the same dissection.  I then elected to stent first the proximal SFA with a 5 x 60 mm stent and distally with a 5 x 100 Gore stent that terminated just below the knee joint.  These were both ironed out with a 5 mm drug-coated balloon but there was still dissection back into the distal SFA.  I then placed a 5 mm x 150 and over the stent proximal to that.  In total patient got 3 stents one at the proximal  SFA one at the distal SFA and the into the [place] going just below the knee joint.  After this there was no flow-limiting dissection or stenosis in the SFA with brisk flow.  I then re-ballooned the posterior tibial artery with a long 3 mm balloon.  I now had risk inflow coming through the posterior tibial artery.  The wire was then retracted and I attempted to feed it past the cannulation site but it kept coming back out the cannulation site.  I inflated the balloon for 3 minutes at low pressure and held pressure on the posterior tibial artery.  Unfortunately after 3 minutes I still had very strong flow.  The catheter and wire were removed and the sheath was pulled back to the external iliac artery on the left and will be removed in postoperative holding.  We  attempted to place a TR band of the ankle but ultimately a zephyr band was placed.  Patient did have a signal distally to the band.  This will be monitored in postoperative holding area.  She tolerated procedure well without immediate complication.  Contrast 160 cc.     Rosi Secrist C. Donzetta Matters, MD [facility] Office: [phone] Pager: [phone]

## 2017-08-29 NOTE — Telephone Encounter (Signed)
sch appt 10/31/17 8am ABI 9am LE Art 945 am p/o MD s/p aortogram B LE runoff stent of SFA and pop

## 2017-08-30 ENCOUNTER — Inpatient Hospital Stay (HOSPITAL_COMMUNITY): Payer: Self-pay

## 2017-08-30 DIAGNOSIS — I2510 Atherosclerotic heart disease of native coronary artery without angina pectoris: Secondary | ICD-10-CM

## 2017-08-30 LAB — GLUCOSE, CAPILLARY
GLUCOSE-CAPILLARY: 85 mg/dL (ref 65–99)
GLUCOSE-CAPILLARY: 77 mg/dL (ref 65–99)
Glucose-Capillary: 69 mg/dL (ref 65–99)
Glucose-Capillary: 84 mg/dL (ref 65–99)
Glucose-Capillary: 122 mg/dL — ABNORMAL HIGH (ref 65–99)
Glucose-Capillary: 132 mg/dL — ABNORMAL HIGH (ref 65–99)

## 2017-08-30 LAB — BASIC METABOLIC PANEL
Anion gap: 8 (ref 5–15)
BUN: 30 mg/dL — ABNORMAL HIGH (ref 6–20)
CALCIUM: 7.6 mg/dL — AB (ref 8.9–10.3)
CO2: 23 mmol/L (ref 22–32)
Chloride: 105 mmol/L (ref 101–111)
Creatinine, Ser: 2.41 mg/dL — ABNORMAL HIGH (ref 0.44–1.00)
GFR calc Af Amer: 25 mL/min — ABNORMAL LOW (ref >60)
GFR, EST NON AFRICAN AMERICAN: 22 mL/min — AB (ref >60)
GLUCOSE: 176 mg/dL — AB (ref 65–99)
POTASSIUM: 3.8 mmol/L (ref 3.5–5.1)
Sodium: 136 mmol/L (ref 135–145)

## 2017-08-30 LAB — URINALYSIS, ROUTINE W REFLEX MICROSCOPIC
Bilirubin Urine: NEGATIVE
GLUCOSE, UA: NEGATIVE mg/dL
Hgb urine dipstick: NEGATIVE
KETONES UR: NEGATIVE mg/dL
Leukocytes, UA: NEGATIVE
NITRITE: NEGATIVE
PROTEIN: 100 mg/dL — AB
Specific Gravity, Urine: 1.015 (ref 1.005–1.030)
pH: 5 (ref 5.0–8.0)

## 2017-08-30 LAB — CBC
HEMATOCRIT: 24.4 % — AB (ref 36.0–46.0)
HEMOGLOBIN: 8.1 g/dL — AB (ref 12.0–15.0)
MCH: 29.7 pg (ref 26.0–34.0)
MCHC: 33.2 g/dL (ref 30.0–36.0)
MCV: 89.4 fL (ref 78.0–100.0)
Platelets: 165 10*3/uL (ref 150–400)
RBC: 2.73 MIL/uL — ABNORMAL LOW (ref 3.87–5.11)
RDW: 13 % (ref 11.5–15.5)
WBC: 7.4 10*3/uL (ref 4.0–10.5)

## 2017-08-30 LAB — SEDIMENTATION RATE: SED RATE: 92 mm/h — AB (ref 0–22)

## 2017-08-30 MED ORDER — SODIUM CHLORIDE 0.9 % IV BOLUS: 500.0000 mL | Freq: Once | INTRAVENOUS | Status: DC

## 2017-08-30 MED ORDER — AMOXICILLIN-POT CLAVULANATE 500-125 MG PO TABS
1.0000 | ORAL_TABLET | Freq: Two times a day (BID) | ORAL | Status: DC
  Administered 2017-08-30 – 2017-09-01 (×5): 500 mg via ORAL
  Filled 2017-08-30 (×7): qty 1

## 2017-08-30 MED ORDER — INSULIN DETEMIR 100 UNIT/ML ~~LOC~~ SOLN
30.0000 [IU] | Freq: Every day | SUBCUTANEOUS | Status: DC
  Administered 2017-08-30: 30 [IU] via SUBCUTANEOUS
  Filled 2017-08-30: qty 0.3

## 2017-08-30 MED ORDER — SODIUM CHLORIDE 0.9 % IV SOLN
INTRAVENOUS | Status: AC
  Administered 2017-08-30 – 2017-08-31 (×2): via INTRAVENOUS

## 2017-08-30 NOTE — Progress Notes (Signed)
Patient arrived to the unit from [facility]. Patient able to ambulate bed. Patient alert and oriented times 4. Patient oriented to unit, call bell, and telephone. Patient denies any needs at this time.

## 2017-08-30 NOTE — Progress Notes (Signed)
[facility] PROGRESS NOTE  Isabel [name] SWF:[id number] DOB: November 30, 1965 DOA: 08/24/2017  PCP: Rory Percy, MD  Brief History/Interval Summary: 52 year old female with a history of diabetes, hypertension, known peripheral vascular disease status post left femoropopliteal bypass and iliofemoral endarterectomy, presents with right great toe ulcer with possible underlying osteomyelitis.  Patient was admitted to [facility] and started on intravenous antibiotics.  ABIs noted to be 0.4 on the right.  Case discussed with vascular surgery who recommended transfer to [facility] for arteriogram.   Reason for Visit: Right great toe ulcer with possible underlying osteomyelitis with peripheral vascular disease  Consultants: Vascular surgery  Procedures:   1.  Ultrasound-guided cannulation left common femoral artery 2.  Aortogram with bilateral lower extremity runoff 3.  Ultrasound-guided cannulation right posterior tibial artery 4.  Stent of SFA takeoff with 5 x 60 mm Innova 5.  Stent of distal SFA proximal popliteal with 5 x 150 mm Innova 6.  Stent of popliteal artery with Gore Tigris 5 x 144m terminating just distal to the joint space 7.  Plain balloon angioplasty of posterior tibial artery with 3 mm balloon 8.  Drug-coated balloon angioplasty of right SFA and popliteal arteries with in.pact 5 x 250 mm balloon 9.  Moderate sedation with fentanyl and Versed 160 minutes  Antibiotics: Vancomycin and Zosyn started on admission.  Changed to Augmentin on 5/17.  Subjective/Interval History: Patient developed some pain in her right lower extremity overnight.  She also felt a knot in her right leg.  Feels better this morning.  Has been making urine.  Denies any shortness of breath.    ROS: Denies any nausea or vomiting  Objective:  Vital Signs  Vitals:   08/30/17 0500 08/30/17 0600 08/30/17 0700 08/30/17 0800  BP: 110/64 (!) 109/47 (!) 128/53   Pulse:   85   Resp: 14  (!) '21 11 16  '$ Temp:   98.7 F (37.1 C)   TempSrc:   Oral   SpO2:   97%   Weight:      Height:        Intake/Output Summary (Last 24 hours) at 08/30/2017 0921 Last data filed at 08/30/2017 0604 Gross per 24 hour  Intake 750 ml  Output 400 ml  Net 350 ml   Filed Weights   08/28/17 0537 08/29/17 0245 08/30/17 0253  Weight: 72.3 kg (159 lb 6.3 oz) 74.1 kg (163 lb 5.8 oz) 77.7 kg (171 lb 4.8 oz)    General appearance: Awake alert.  In no distress. Resp: Clear to auscultation bilaterally.  No wheezing rales or rhonchi. Cardio: S1-S2 is normal regular.  No S3-S4.  No rubs murmurs or bruit GI: Abdomen remains soft.  Nontender nondistended.  Bowel sounds are present.  No masses organomegaly. Extremities: No obvious deformity noted in the right lower extremity.  Mild edema is present.  There is a soft tissue swelling noted in the right lateral knee area.  Mildly tender.  No erythema is noted. Neurologic: No focal deficits  Lab Results:  Data Reviewed: I have personally reviewed following labs and imaging studies  CBC: Recent Labs  Lab 08/24/17 1152 08/25/17 0542 08/28/17 2041 08/29/17 0230 08/30/17 0238  WBC 6.4 6.5 10.0 7.9 7.4  HGB 12.3 10.9* 11.1* 9.5* 8.1*  HCT 36.7 32.9* 33.1* 28.5* 24.4*  MCV 88.4 88.2 87.3 88.0 89.4  PLT 208 189 201 201 1220   Basic Metabolic Panel: Recent Labs  Lab 08/25/17 0542 08/27/17 0811 08/28/17 1019 08/28/17  2041 08/29/17 0230 08/30/17 0238  NA 135 140 141  --  140 136  K 3.8 3.8 3.9  --  3.5 3.8  CL 104 106 104  --  106 105  CO2 '28 26 29  '$ --  25 23  GLUCOSE 199* 125* 180*  --  171* 176*  BUN 22* 17 16  --  22* 30*  CREATININE 1.28* 1.13* 1.30* 1.29* 1.62* 2.41*  CALCIUM 8.8* 9.5 9.1  --  8.4* 7.6*    GFR: Estimated Creatinine Clearance: 25.8 mL/min (A) (by C-G formula based on SCr of 2.41 mg/dL (H)).  Liver Function Tests: Recent Labs  Lab 08/25/17 0542  AST 11*  ALT 12*  ALKPHOS 68  BILITOT 0.6  PROT 5.3*  ALBUMIN  2.0*    CBG: Recent Labs  Lab 08/29/17 0621 08/29/17 1304 08/29/17 1741 08/29/17 2053 08/30/17 0612  GLUCAP 149* 228* 180* 186* 85    Radiology Studies: No results found.   Medications:  Scheduled: . amoxicillin-clavulanate  1 tablet Oral BID  . aspirin EC  81 mg Oral Daily  . atorvastatin  80 mg Oral q1800  . carvedilol  3.125 mg Oral BID WC  . clopidogrel  75 mg Oral Q breakfast  . heparin  5,000 Units Subcutaneous Q8H  . insulin aspart  0-15 Units Subcutaneous TID WC  . insulin detemir  40 Units Subcutaneous QHS  . pantoprazole  40 mg Oral Daily  . potassium chloride  20 mEq Oral Once  . sodium chloride flush  3 mL Intravenous Q12H   Continuous: . sodium chloride Stopped (08/28/17 2200)  . sodium chloride    . sodium chloride    . 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  She mentioned claudication symptoms in her lower extremities.  Ulcer on right great toe has been present for approximately a week.  Plain films indicated possible underlying osteomyelitis.  Patient was started on IV antibiotics with vancomycin and Zosyn. Unfortunately no cultures were sent at the time of admission or since.  Patient underwent vascular procedure with angioplasty and stent placement to right lower extremity.  No further surgeries are planned.  Plavix has been added which she will need to take for at least 3 months.  Continue aspirin.  Discussed with infectious disease regarding treatment for the osteomyelitis.  She was  changed over to oral Augmentin.  ESR noted to be 92.  This will need to be monitored in the outpatient setting.    Acute renal failure BUN and creatinine have increased significantly.  She still continues to make urine.  Reason for her renal failure could be contrast induced nephropathy.   Antibiotics could have also contributed.  UA. We will give her fluid bolus and increase the rate of IV fluids.  Recheck labs tomorrow.  Renal ultrasound.    Type II diabetes with vascular complications Blood sugars are currently stable.  Cut back on the dose of Levemir due to low normal glucose levels and worsening renal function.    Essential Hypertension Blood pressure remains stable.  Holding patient's lisinopril and hydralazine.  Continue carvedilol for now.    Hyperlipidemia Continue statin  Normocytic anemia Hemoglobin continues to  drift down.  No obvious overt bleeding noted.  Possibly dilutional.  Continue to monitor for now.    History of coronary artery disease Stable.  Continue beta-blocker and aspirin.  DVT Prophylaxis: Lovenox    Code Status: Full code Family Communication: Discussed with the patient Disposition Plan: Management as outlined above.  IV fluids.  Further evaluation and management of renal failure.  Mobilize as tolerated. Not ready for discharge today.      LOS: 6 days   [facility] Pager [phone] 08/30/2017, 9:21 AM  If 7PM-7AM, please contact night-coverage at [email or web address], password [facility]

## 2017-08-30 NOTE — Progress Notes (Signed)
PHARMACY NOTE:  ANTIMICROBIAL RENAL DOSAGE ADJUSTMENT  Current antimicrobial regimen includes a mismatch between antimicrobial dosage and estimated renal function.  As per policy approved by the Pharmacy & Therapeutics and Medical Executive Committees, the antimicrobial dosage will be adjusted accordingly.  Current antimicrobial dosage:  Augmentin 875mg  PO BID  Indication: right great toe ulcer  Renal Function:  Estimated Creatinine Clearance: 25.8 mL/min (A) (by C-G formula based on SCr of 2.41 mg/dL (H)). []      On intermittent HD, scheduled: []      On CRRT    Antimicrobial dosage has been changed to:  Augmentin 500mg  PO BID  Additional comments:   Thank you for allowing pharmacy to be a part of this patient's care.  Sallee Provencal, [facility] 08/30/2017 7:30 AM

## 2017-08-30 NOTE — Progress Notes (Signed)
Subjective: Interval History: none.. Reports some soreness in her posterior calf on the right.  Does have a 1 cm nodule on the lateral aspect just above her right knee with some tenderness  Objective: Vital signs in last 24 hours: Temp:  [98.3 F (36.8 C)-98.7 F (37.1 C)] 98.7 F (37.1 C) (05/18 0700) Pulse Rate:  [80-90] 85 (05/18 0700) Resp:  [11-21] 16 (05/18 0800) BP: (87-128)/(43-65) 128/53 (05/18 0700) SpO2:  [94 %-97 %] 97 % (05/18 0700) Weight:  [171 lb 4.8 oz (77.7 kg)] 171 lb 4.8 oz (77.7 kg) (05/18 0253)  Intake/Output from previous day: 05/17 0701 - 05/18 0700 In: 1250 [I.V.:750; IV Piggyback:500] Out: 400 [Urine:400] Intake/Output this shift: No intake/output data recorded.  Right toe wound stable.  Foot warm and well-perfused.  No evidence of hematoma in her left groin access site or in her posterior tibial access site on the right.  Does have a soft slightly tender nodule over the lateral aspect of her knee of  unknown etiology.  Lab Results: Recent Labs    08/29/17 0230 08/30/17 0238  WBC 7.9 7.4  HGB 9.5* 8.1*  HCT 28.5* 24.4*  PLT 201 165   BMET Recent Labs    08/29/17 0230 08/30/17 0238  NA 140 136  K 3.5 3.8  CL 106 105  CO2 25 23  GLUCOSE 171* 176*  BUN 22* 30*  CREATININE 1.62* 2.41*  CALCIUM 8.4* 7.6*    Studies/Results: US Arterial Abi (screening Lower Extremity)  Result Date: 08/25/2017 CLINICAL DATA:  Right great toe ulceration. Right foot pain. Previous left toe amputations. Previous left iliofemoral endarterectomy and fem-pop bypass graft. Diabetes. EXAM: NONINVASIVE PHYSIOLOGIC VASCULAR STUDY OF BILATERAL LOWER EXTREMITIES TECHNIQUE: Evaluation of both lower extremities were performed at rest, including calculation of ankle-brachial indices with single level Doppler, pressure and pulse volume recording. COMPARISON:  07/20/2015 FINDINGS: Right ABI:  0.42 (previously 0.87) Left ABI:  0.95 (previously 0.42) Right Lower Extremity:   Monophasic distal arterial waveforms. Left Lower Extremity:  Biphasic distal arterial waveforms. IMPRESSION: 1. Severe progressive arterial occlusive disease in the right lower extremity. 2. Interval improvement in left lower extremity perfusion to near normal at rest. Electronically Signed   By: Corlis Leak M.D.   On: 08/25/2017 13:56   Dg Toe Great Right  Result Date: 08/24/2017 CLINICAL DATA:  Cellulitis of the right great toe. EXAM: RIGHT GREAT TOE COMPARISON:  None. FINDINGS: There is no evidence of fracture or dislocation. There is mild osteopenia with slight cortical irregularity of the distal tuft of the right first distal phalanx. There is soft tissue swelling of the distal right great toe. IMPRESSION: Mild osteopenia with slight cortical irregularity of the distal tuft of the right first distal phalanx, suspicious for underlying osteomyelitis. Electronically Signed   By: Sherian Rein M.D.   On: 08/24/2017 13:14   Anti-infectives: Anti-infectives (From admission, onward)   Start     Dose/Rate Route Frequency Ordered Stop   08/30/17 1000  amoxicillin-clavulanate (AUGMENTIN) 500-125 MG per tablet 500 mg     1 tablet Oral 2 times daily 08/30/17 0732     08/29/17 1100  amoxicillin-clavulanate (AUGMENTIN) 875-125 MG per tablet 1 tablet  Status:  Discontinued     1 tablet Oral Every 12 hours 08/29/17 1054 08/30/17 0732   08/28/17 2130  vancomycin (VANCOCIN) 500 mg in sodium chloride 0.9 % 100 mL IVPB  Status:  Discontinued     500 mg 100 mL/hr over 60 Minutes Intravenous Every 12 hours  08/28/17 2110 08/29/17 1054   08/25/17 1700  vancomycin (VANCOCIN) 500 mg in sodium chloride 0.9 % 100 mL IVPB  Status:  Discontinued     500 mg 100 mL/hr over 60 Minutes Intravenous Every 12 hours 08/25/17 1128 08/28/17 2110   08/24/17 2000  piperacillin-tazobactam (ZOSYN) IVPB 3.375 g  Status:  Discontinued     3.375 g 12.5 mL/hr over 240 Minutes Intravenous Every 8 hours 08/24/17 1632 08/29/17 1054    08/24/17 1700  vancomycin (VANCOCIN) IVPB 1000 mg/200 mL premix  Status:  Discontinued     1,000 mg 200 mL/hr over 60 Minutes Intravenous Every 12 hours 08/24/17 1632 08/25/17 1128   08/24/17 1345  piperacillin-tazobactam (ZOSYN) IVPB 3.375 g     3.375 g 100 mL/hr over 30 Minutes Intravenous  Once 08/24/17 1343 08/24/17 1504      Assessment/Plan: s/p Procedure(s): ABDOMINAL AORTOGRAM W/LOWER EXTREMITY (N/A) PERIPHERAL VASCULAR INTERVENTION (Right) Table overall.  Creatinine has increased to 2.4.  H&H down slightly as well.  No evidence of invasive infection in her foot.  Continue with hydration and repeat labs   LOS: 6 days   Isabel [name] 08/30/2017, 8:44 AM

## 2017-08-31 LAB — CBC
HEMATOCRIT: 23.8 % — AB (ref 36.0–46.0)
Hemoglobin: 7.7 g/dL — ABNORMAL LOW (ref 12.0–15.0)
MCH: 29.5 pg (ref 26.0–34.0)
MCHC: 32.4 g/dL (ref 30.0–36.0)
MCV: 91.2 fL (ref 78.0–100.0)
Platelets: 164 10*3/uL (ref 150–400)
RBC: 2.61 MIL/uL — ABNORMAL LOW (ref 3.87–5.11)
RDW: 12.9 % (ref 11.5–15.5)
WBC: 6.9 10*3/uL (ref 4.0–10.5)

## 2017-08-31 LAB — BASIC METABOLIC PANEL
Anion gap: 9 (ref 5–15)
BUN: 31 mg/dL — AB (ref 6–20)
CO2: 20 mmol/L — AB (ref 22–32)
CREATININE: 2.54 mg/dL — AB (ref 0.44–1.00)
Calcium: 7.6 mg/dL — ABNORMAL LOW (ref 8.9–10.3)
Chloride: 106 mmol/L (ref 101–111)
GFR calc Af Amer: 24 mL/min — ABNORMAL LOW (ref >60)
GFR calc non Af Amer: 21 mL/min — ABNORMAL LOW (ref >60)
Glucose, Bld: 78 mg/dL (ref 65–99)
Potassium: 3.8 mmol/L (ref 3.5–5.1)
Sodium: 135 mmol/L (ref 135–145)

## 2017-08-31 LAB — GLUCOSE, CAPILLARY
GLUCOSE-CAPILLARY: 166 mg/dL — AB (ref 65–99)
GLUCOSE-CAPILLARY: 203 mg/dL — AB (ref 65–99)
Glucose-Capillary: 52 mg/dL — ABNORMAL LOW (ref 65–99)
Glucose-Capillary: 109 mg/dL — ABNORMAL HIGH (ref 65–99)
Glucose-Capillary: 56 mg/dL — ABNORMAL LOW (ref 65–99)
Glucose-Capillary: 202 mg/dL — ABNORMAL HIGH (ref 65–99)

## 2017-08-31 MED ORDER — INSULIN DETEMIR 100 UNIT/ML ~~LOC~~ SOLN
10.0000 [IU] | Freq: Every day | SUBCUTANEOUS | Status: DC
  Administered 2017-08-31: 10 [IU] via SUBCUTANEOUS
  Filled 2017-08-31 (×2): qty 0.1

## 2017-08-31 MED ORDER — METHOCARBAMOL 500 MG PO TABS
500.0000 mg | ORAL_TABLET | Freq: Once | ORAL | Status: AC
  Administered 2017-08-31: 500 mg via ORAL
  Filled 2017-08-31: qty 1

## 2017-08-31 MED ORDER — SODIUM CHLORIDE 0.9 % IV SOLN
INTRAVENOUS | Status: AC
  Administered 2017-08-31 – 2017-09-01 (×2): via INTRAVENOUS

## 2017-08-31 MED ORDER — INSULIN ASPART 100 UNIT/ML ~~LOC~~ SOLN
0.0000 [IU] | Freq: Three times a day (TID) | SUBCUTANEOUS | Status: DC
  Administered 2017-09-01 (×2): 2 [IU] via SUBCUTANEOUS

## 2017-08-31 NOTE — Progress Notes (Signed)
Patient ID: Isabel [name], female   DOB: 07-Oct-1965, 52 y.o.   MRN: [id number] Comfortable. Right foot well perfused.  Still with fullness over her distal right thigh with mild tenderness. No left groin or right posterior tibial hematoma.  Anemia stable and asymptomatic compared to yesterday's numbers.  Chemistries pending.  Blood was drawn around 4 AM but not back yet.  Creatinine was 2.4 yesterday.  Discussed with patient.  Awaiting peak and her creatinine.  Still with good urine output.

## 2017-08-31 NOTE — Progress Notes (Signed)
Patient with low CBGs with symptoms of diaphoretic and feels hot. CBG 53. Given graham cracker and juice. Repeat CBG 56. Given more juice and repeat CBG 109. Patient is not diaphoretic and feels cold. Notified MD. Verbal order to hold SSI today. Will continue to monitor.

## 2017-08-31 NOTE — Progress Notes (Signed)
Patient ambulated in hallway 500 feet. OOB to chair

## 2017-08-31 NOTE — Progress Notes (Addendum)
[facility] PROGRESS NOTE  MCKENZYE CUTRIGHT BJY:[id number] DOB: 01/10/1966 DOA: 08/24/2017  PCP: Rory Percy, MD  Brief History/Interval Summary: 52 year old female with a history of diabetes, hypertension, known peripheral vascular disease status post left femoropopliteal bypass and iliofemoral endarterectomy, presents with right great toe ulcer with possible underlying osteomyelitis.  Patient was admitted to [facility] and started on intravenous antibiotics.  ABIs noted to be 0.4 on the right.  Case discussed with vascular surgery who recommended transfer to [facility] for arteriogram.   Reason for Visit: Right great toe ulcer with possible underlying osteomyelitis with peripheral vascular disease  Consultants: Vascular surgery  Procedures:   1.  Ultrasound-guided cannulation left common femoral artery 2.  Aortogram with bilateral lower extremity runoff 3.  Ultrasound-guided cannulation right posterior tibial artery 4.  Stent of SFA takeoff with 5 x 60 mm Innova 5.  Stent of distal SFA proximal popliteal with 5 x 150 mm Innova 6.  Stent of popliteal artery with Gore Tigris 5 x 183m terminating just distal to the joint space 7.  Plain balloon angioplasty of posterior tibial artery with 3 mm balloon 8.  Drug-coated balloon angioplasty of right SFA and popliteal arteries with in.pact 5 x 250 mm balloon 9.  Moderate sedation with fentanyl and Versed 160 minutes  Antibiotics: Vancomycin and Zosyn started on admission.  Changed to Augmentin on 5/17.  Subjective/Interval History: Patient without any complaints this morning.  Making urine.  Denies any shortness of breath or chest pain.  The pain in her right leg is better.    ROS: Denies any nausea or vomiting  Objective:  Vital Signs  Vitals:   08/30/17 2133 08/31/17 0502 08/31/17 0814 08/31/17 0858  BP: (!) 110/53 (!) 104/53  131/73  Pulse: 89 81  83  Resp: 16 16    Temp: 99.4 F (37.4 C) 98.2 F  (36.8 C) 98.2 F (36.8 C)   TempSrc: Oral Oral Oral   SpO2: 97% 97%    Weight:      Height:        Intake/Output Summary (Last 24 hours) at 08/31/2017 1012 Last data filed at 08/31/2017 0810 Gross per 24 hour  Intake 1675 ml  Output 180 ml  Net 1495 ml   Filed Weights   08/29/17 0245 08/30/17 0253 08/30/17 1426  Weight: 74.1 kg (163 lb 5.8 oz) 77.7 kg (171 lb 4.8 oz) 75.1 kg (165 lb 8 oz)    General appearance: Awake alert.  In no distress Resp: Clear to auscultation bilaterally.  No wheezing rales or rhonchi. Cardio: S1-S2 is normal regular.  No S3-S4.  No rubs murmurs or bruit GI: Abdomen remains soft.  Nontender nondistended.  Bowel sounds are present.  No masses organomegaly.  Extremities: No obvious deformity noted in the right lower extremity.  Mild edema is present.  There is a soft tissue swelling noted in the right lateral knee area.  Mildly tender.  No erythema is noted. no changes compared to yesterday. Neurologic: No focal deficits  Lab Results:  Data Reviewed: I have personally reviewed following labs and imaging studies  CBC: Recent Labs  Lab 08/25/17 0542 08/28/17 2041 08/29/17 0230 08/30/17 0238 08/31/17 0342  WBC 6.5 10.0 7.9 7.4 6.9  HGB 10.9* 11.1* 9.5* 8.1* 7.7*  HCT 32.9* 33.1* 28.5* 24.4* 23.8*  MCV 88.2 87.3 88.0 89.4 91.2  PLT 189 201 201 165 1086   Basic Metabolic Panel: Recent Labs  Lab 08/27/17 0811 08/28/17 1019 08/28/17  2041 08/29/17 0230 08/30/17 0238 08/31/17 0342  NA 140 141  --  140 136 135  K 3.8 3.9  --  3.5 3.8 3.8  CL 106 104  --  106 105 106  CO2 26 29  --  25 23 20*  GLUCOSE 125* 180*  --  171* 176* 78  BUN 17 16  --  22* 30* 31*  CREATININE 1.13* 1.30* 1.29* 1.62* 2.41* 2.54*  CALCIUM 9.5 9.1  --  8.4* 7.6* 7.6*    GFR: Estimated Creatinine Clearance: 24 mL/min (A) (by C-G formula based on SCr of 2.54 mg/dL (H)).  Liver Function Tests: Recent Labs  Lab 08/25/17 0542  AST 11*  ALT 12*  ALKPHOS 68  BILITOT  0.6  PROT 5.3*  ALBUMIN 2.0*    CBG: Recent Labs  Lab 08/30/17 1708 08/30/17 2130 08/31/17 0802 08/31/17 0836 08/31/17 0900  GLUCAP 122* 132* 52* 56* 109*    Radiology Studies: US Renal  Result Date: 08/30/2017 CLINICAL DATA:  Acute onset of renal failure. EXAM: RENAL / URINARY TRACT ULTRASOUND COMPLETE COMPARISON:  Abdominal ultrasound performed 12/09/2005 FINDINGS: Right Kidney: Length: 14.8 cm. Slightly increased parenchymal echogenicity is noted. No mass or hydronephrosis visualized. Left Kidney: Length: 13.7 cm. Slightly increased parenchymal echogenicity is noted. No mass or hydronephrosis visualized. Bladder: Appears normal for degree of bladder distention. IMPRESSION: No evidence of hydronephrosis. Slightly increased renal parenchymal echogenicity raises concern for medical renal disease. Electronically Signed   By: Garald Balding M.D.   On: 08/30/2017 21:53     Medications:  Scheduled: . amoxicillin-clavulanate  1 tablet Oral BID  . aspirin EC  81 mg Oral Daily  . atorvastatin  80 mg Oral q1800  . carvedilol  3.125 mg Oral BID WC  . clopidogrel  75 mg Oral Q breakfast  . heparin  5,000 Units Subcutaneous Q8H  . insulin aspart  0-15 Units Subcutaneous TID WC  . insulin detemir  10 Units Subcutaneous QHS  . pantoprazole  40 mg Oral Daily  . potassium chloride  20 mEq Oral Once  . sodium chloride flush  3 mL Intravenous Q12H   Continuous: . sodium chloride Stopped (08/28/17 2200)  . sodium chloride    . sodium chloride    . sodium chloride     CZY:SAYTKZ chloride, [DISCONTINUED] acetaminophen **OR** acetaminophen, acetaminophen, ALPRAZolam, HYDROmorphone (DILAUDID) injection, ondansetron **OR** ondansetron (ZOFRAN) IV, ondansetron (ZOFRAN) IV, oxyCODONE, polyethylene glycol, sodium chloride flush, traMADol  Assessment/Plan:    Right great toe osteomyelitis with underlying peripheral vascular disease Patient with previous history of arteriogram with left  iliofemoral endarterectomy and left femoropopliteal bypass.  She mentioned claudication symptoms in her lower extremities.  Ulcer on right great toe had been present for approximately a week.  Plain films indicated possible underlying osteomyelitis.  Patient was started on IV antibiotics with vancomycin and Zosyn. Unfortunately no cultures were sent at the time of admission or since.  Patient underwent vascular procedure with angioplasty and stent placement to right lower extremity.  No further surgeries are planned.  Plavix has been added which she will need to take for at least 3 months.  Continue aspirin.  Discussed with infectious disease regarding treatment for the osteomyelitis.  She was changed over to oral Augmentin.  ESR noted to be 92.  This will need to be monitored in the outpatient setting.  Continue aspirin and Plavix.  Continue Augmentin.  Acute renal failure Possibly contrast nephropathy.  Hemodynamics could have also contributed.  Patient was on  lisinopril prior to admission and has been noted to have soft blood pressures.  Patient developed acute renal failure.  Her BUN creatinine increased and today creatinine noted to be 2.54.  The rate of increase appears to have slowed down.  She continues to make urine although it is unclear if this is being charted accurately.  We will increase the rate of IV fluids today.  Hopefully she will start turning around soon.  Renal ultrasound did not show any hydronephrosis but did suggest medical renal disease.  UA reviewed.  Continue to monitor urine output.     Type II diabetes with vascular complications/hypoglycemia Patient developed hypoglycemic episode this morning.  Worsening renal function could have resulted in lower insulin requirements.  We will cut back on the dose of her long-acting insulin.  Continue to monitor closely.     Normocytic anemia Hemoglobin slightly lower today compared to yesterday.  Again no obvious overt blood loss is noted.   Drop is likely dilutional.  Continue to monitor for now.    Essential Hypertension Blood pressure remains stable.  Continue to hold lisinopril and hydralazine.  Continue carvedilol for now.     Hyperlipidemia Continue statin  History of coronary artery disease Stable.  Continue beta-blocker and aspirin.  DVT Prophylaxis: Lovenox    Code Status: Full code Family Communication: Discussed with the patient Disposition Plan: Regimen as outlined above.  Increase rate of IV fluids.  Continue to trend creatinine and monitor urine output.    LOS: 7 days   [facility] Pager [phone] 08/31/2017, 10:12 AM  If 7PM-7AM, please contact night-coverage at [email or web address], password [facility]

## 2017-09-01 ENCOUNTER — Inpatient Hospital Stay (HOSPITAL_COMMUNITY): Payer: Self-pay

## 2017-09-01 DIAGNOSIS — M79609 Pain in unspecified limb: Secondary | ICD-10-CM

## 2017-09-01 DIAGNOSIS — R609 Edema, unspecified: Secondary | ICD-10-CM

## 2017-09-01 LAB — CBC
HCT: 24.3 % — ABNORMAL LOW (ref 36.0–46.0)
Hemoglobin: 7.8 g/dL — ABNORMAL LOW (ref 12.0–15.0)
MCH: 29.3 pg (ref 26.0–34.0)
MCHC: 32.1 g/dL (ref 30.0–36.0)
MCV: 91.4 fL (ref 78.0–100.0)
PLATELETS: 175 10*3/uL (ref 150–400)
RBC: 2.66 MIL/uL — ABNORMAL LOW (ref 3.87–5.11)
RDW: 13 % (ref 11.5–15.5)
WBC: 6.4 10*3/uL (ref 4.0–10.5)

## 2017-09-01 LAB — BASIC METABOLIC PANEL
ANION GAP: 8 (ref 5–15)
BUN: 28 mg/dL — AB (ref 6–20)
CALCIUM: 7.6 mg/dL — AB (ref 8.9–10.3)
CO2: 20 mmol/L — ABNORMAL LOW (ref 22–32)
Chloride: 107 mmol/L (ref 101–111)
Creatinine, Ser: 2.06 mg/dL — ABNORMAL HIGH (ref 0.44–1.00)
GFR calc Af Amer: 31 mL/min — ABNORMAL LOW (ref >60)
GFR, EST NON AFRICAN AMERICAN: 27 mL/min — AB (ref >60)
Glucose, Bld: 165 mg/dL — ABNORMAL HIGH (ref 65–99)
Potassium: 4.3 mmol/L (ref 3.5–5.1)
SODIUM: 135 mmol/L (ref 135–145)

## 2017-09-01 LAB — GLUCOSE, CAPILLARY
GLUCOSE-CAPILLARY: 145 mg/dL — AB (ref 65–99)
GLUCOSE-CAPILLARY: 150 mg/dL — AB (ref 65–99)

## 2017-09-01 MED ORDER — AMOXICILLIN-POT CLAVULANATE 500-125 MG PO TABS: 1.0000 | ORAL_TABLET | Freq: Two times a day (BID) | ORAL | 0 refills | Status: AC

## 2017-09-01 MED ORDER — OXYCODONE HCL 5 MG PO TABS: 5.0000 mg | ORAL_TABLET | ORAL | 0 refills | Status: DC | PRN

## 2017-09-01 MED ORDER — INSULIN DETEMIR 100 UNIT/ML FLEXPEN: 20.0000 [IU] | PEN_INJECTOR | Freq: Every day | SUBCUTANEOUS | 11 refills | Status: DC

## 2017-09-01 MED ORDER — CLOPIDOGREL BISULFATE 75 MG PO TABS: 75.0000 mg | ORAL_TABLET | Freq: Every day | ORAL | 2 refills | Status: AC

## 2017-09-01 MED ORDER — POLYETHYLENE GLYCOL 3350 17 G PO PACK: 17.0000 g | PACK | Freq: Every day | ORAL | 0 refills | Status: AC

## 2017-09-01 NOTE — Discharge Summary (Signed)
[facility]  Physician Discharge Summary   Patient ID: Isabel [name] MRN: [id number] DOB/AGE: 1965-05-06 52 y.o.  Admit date: 08/24/2017 Discharge date: 09/01/2017  PCP: Rory Percy, MD  DISCHARGE DIAGNOSES:  Acute osteomyelitis involving the left first toe Peripheral artery disease status post intervention  RECOMMENDATIONS FOR OUTPATIENT FOLLOW UP: 1. Patient to follow-up with the primary care provider in the next 2 to 3 days to have blood work done to check her renal function 2. Lisinopril placed on hold 3. Dose of insulin reduced due to decreased requirements from renal failure.  Will likely need further adjustment as outpatient. 4. ESR to be monitored as outpatient.   DISCHARGE CONDITION: fair  Diet recommendation: As before  Filed Weights   08/29/17 0245 08/30/17 0253 08/30/17 1426  Weight: 74.1 kg (163 lb 5.8 oz) 77.7 kg (171 lb 4.8 oz) 75.1 kg (165 lb 8 oz)    INITIAL HISTORY: 52 year old female with a history of diabetes, hypertension, known peripheral vascular disease status post left femoropopliteal bypass and iliofemoral endarterectomy, presents with right great toe ulcer with possible underlying osteomyelitis.  Patient was admitted to [facility] and started on intravenous antibiotics.  ABIs noted to be 0.4 on the right.  Case discussed with vascular surgery who recommended transfer [facility].   Consultants: Vascular surgery  Procedures:   1.Ultrasound-guided cannulation left common femoral artery 2.Aortogram with bilateral lower extremity runoff 3.Ultrasound-guided cannulation right posterior tibial artery 4.Stent of SFA takeoff with 5 x 60 mm Innova 5.Stent of distal SFA proximal popliteal with 5 x 150 mm Innova 6.Stent of popliteal artery with Gore Tigris 5 x 142mterminating just distal to the joint space 7.Plain balloon angioplasty of posterior tibial artery with 3 mm balloon 8.Drug-coated  balloon angioplasty of right SFA and popliteal arteries with in.pact 5 x 250 mm balloon 9.Moderate sedation with fentanyl and Versed160 minutes  Right Lower extremity venous Doppler: Negative for DVT  Antibiotics: Vancomycin and Zosyn started on admission.  Changed to Augmentin on 5/17.   HOSPITAL COURSE:   Right great toe osteomyelitis with underlying peripheral vascular disease Patient with previous history of arteriogram with left iliofemoral endarterectomy and left femoropopliteal bypass.  She mentioned claudication symptoms in her lower extremities.  Ulcer on right great toe had been present for approximately a week.  Plain films indicated possible underlying osteomyelitis.  Patient was started on IV antibiotics with vancomycin and Zosyn. Unfortunately no cultures were sent at the time of admission or since.  Patient underwent vascular procedure with angioplasty and stent placement to right lower extremity.  Plavix has been added which she will need to take for at least 3 months.  Continue aspirin.  Discussed with infectious disease regarding treatment for the osteomyelitis.  She was changed over to oral Augmentin.  ESR noted to be 92.  This will need to be monitored in the outpatient setting.  Continue aspirin and Plavix.    She will be discharged on Augmentin for at least 4 weeks.  Acute renal failure Possibly contrast nephropathy.  Hemodynamics could have also contributed.  Patient was on lisinopril prior to admission and has been noted to have soft blood pressures.  Patient developed acute renal failure.  Her BUN creatinine increased and creatinine peaked at 2.54.  She was given IV fluids.  She slowly started improving.  Her urine output picked up.  Creatinine is down to 2.06. She really wants to go home today.  Since creatinine has improved we anticipate it  to continue the same trajectory.  She was asked to remain well-hydrated at home.  She will follow-up with her PCP in the next few  days to recheck her renal function.  Renal ultrasound did not show any hydronephrosis but did suggest medical renal disease.    Type II diabetes with vascular complications/hypoglycemia Due to renal failure her insulin requirements decreased as a result of which she became hypoglycemic.  Dose of insulin has been reduced.  Follow-up with PCP.     Normocytic anemia There was a drop in hemoglobin probably due to dilution and perhaps mild bleeding from the procedure.  But hemoglobin is stable today.    Essential Hypertension Blood pressure stable.  Continue carvedilol.  Hold lisinopril due to elevated creatinine.  Hyperlipidemia Continue statin  History of coronary artery disease Stable.  Continue beta-blocker and aspirin.  Seen by vascular surgery this morning and they were concerned about swelling of the right lower extremity.  Venous Doppler negative for DVT.  They have cleared her for discharge.  Overall stable.  She has ambulated without any difficulties.  Okay for discharge home today.   PERTINENT LABS:  The results of significant diagnostics from this hospitalization (including imaging, microbiology, ancillary and laboratory) are listed below for reference.     Labs: Basic Metabolic Panel: Recent Labs  Lab 08/28/17 1019 08/28/17 2041 08/29/17 0230 08/30/17 0238 08/31/17 0342 09/01/17 0440  NA 141  --  140 136 135 135  K 3.9  --  3.5 3.8 3.8 4.3  CL 104  --  106 105 106 107  CO2 29  --  25 23 20* 20*  GLUCOSE 180*  --  171* 176* 78 165*  BUN 16  --  22* 30* 31* 28*  CREATININE 1.30* 1.29* 1.62* 2.41* 2.54* 2.06*  CALCIUM 9.1  --  8.4* 7.6* 7.6* 7.6*   CBC: Recent Labs  Lab 08/28/17 2041 08/29/17 0230 08/30/17 0238 08/31/17 0342 09/01/17 0440  WBC 10.0 7.9 7.4 6.9 6.4  HGB 11.1* 9.5* 8.1* 7.7* 7.8*  HCT 33.1* 28.5* 24.4* 23.8* 24.3*  MCV 87.3 88.0 89.4 91.2 91.4  PLT 201 201 165 164 175    CBG: Recent Labs  Lab 08/31/17 1202 08/31/17 1651  08/31/17 2119 09/01/17 0819 09/01/17 1302  GLUCAP 166* 202* 203* 145* 150*     IMAGING STUDIES US Renal  Result Date: 08/30/2017 CLINICAL DATA:  Acute onset of renal failure. EXAM: RENAL / URINARY TRACT ULTRASOUND COMPLETE COMPARISON:  Abdominal ultrasound performed 12/09/2005 FINDINGS: Right Kidney: Length: 14.8 cm. Slightly increased parenchymal echogenicity is noted. No mass or hydronephrosis visualized. Left Kidney: Length: 13.7 cm. Slightly increased parenchymal echogenicity is noted. No mass or hydronephrosis visualized. Bladder: Appears normal for degree of bladder distention. IMPRESSION: No evidence of hydronephrosis. Slightly increased renal parenchymal echogenicity raises concern for medical renal disease. Electronically Signed   By: Garald Balding M.D.   On: 08/30/2017 21:53   US Arterial Abi (screening Lower Extremity)  Result Date: 08/25/2017 CLINICAL DATA:  Right great toe ulceration. Right foot pain. Previous left toe amputations. Previous left iliofemoral endarterectomy and fem-pop bypass graft. Diabetes. EXAM: NONINVASIVE PHYSIOLOGIC VASCULAR STUDY OF BILATERAL LOWER EXTREMITIES TECHNIQUE: Evaluation of both lower extremities were performed at rest, including calculation of ankle-brachial indices with single level Doppler, pressure and pulse volume recording. COMPARISON:  07/20/2015 FINDINGS: Right ABI:  0.42 (previously 0.87) Left ABI:  0.95 (previously 0.42) Right Lower Extremity:  Monophasic distal arterial waveforms. Left Lower Extremity:  Biphasic distal arterial waveforms.  IMPRESSION: 1. Severe progressive arterial occlusive disease in the right lower extremity. 2. Interval improvement in left lower extremity perfusion to near normal at rest. Electronically Signed   By: Lucrezia Europe M.D.   On: 08/25/2017 13:56   Dg Toe Great Right  Result Date: 08/24/2017 CLINICAL DATA:  Cellulitis of the right great toe. EXAM: RIGHT GREAT TOE COMPARISON:  None. FINDINGS: There is no evidence  of fracture or dislocation. There is mild osteopenia with slight cortical irregularity of the distal tuft of the right first distal phalanx. There is soft tissue swelling of the distal right great toe. IMPRESSION: Mild osteopenia with slight cortical irregularity of the distal tuft of the right first distal phalanx, suspicious for underlying osteomyelitis. Electronically Signed   By: Abelardo Diesel M.D.   On: 08/24/2017 13:14    DISCHARGE EXAMINATION: Vitals:   08/31/17 1405 08/31/17 1425 08/31/17 2052 09/01/17 0536  BP: 122/70 122/70 122/69 101/62  Pulse: 94 94 92 85  Resp: '20 20 17 16  '$ Temp: 98.3 F (36.8 C) 98.3 F (36.8 C) 99.3 F (37.4 C) 98.7 F (37.1 C)  TempSrc: Oral Oral    SpO2: 100% 100% 96% 94%  Weight:      Height:       General appearance: alert, cooperative, appears stated age and no distress Resp: clear to auscultation bilaterally Cardio: regular rate and rhythm, S1, S2 normal, no murmur, click, rub or gallop GI: soft, non-tender; bowel sounds normal; no masses,  no organomegaly  DISPOSITION: Home  Discharge Instructions    Call MD for:  extreme fatigue   Complete by:  As directed    Call MD for:  persistant dizziness or light-headedness   Complete by:  As directed    Call MD for:  persistant nausea and vomiting   Complete by:  As directed    Call MD for:  severe uncontrolled pain   Complete by:  As directed    Call MD for:  temperature >100.4   Complete by:  As directed    Diet Carb Modified   Complete by:  As directed    Discharge instructions   Complete by:  As directed    1. Please be sure to follow-up with your primary care provider within the next 2 to 3 days to have blood work done to check your kidney function.   2. Please monitor your blood glucose levels at home closely.  You are requiring lower dose of insulin but these requirements may climb over the next many days as your kidney function improves.  Please be sure to discuss this with your primary  care provider. 3. Please take your medications as prescribed.   4. Lisinopril has been discontinued for now due to the impaired kidney function.  This can be resumed once your primary care provider has seen you in follow-up and depending on the kidney function.  You were cared for by a hospitalist during your hospital stay. If you have any questions about your discharge medications or the care you received while you were in the hospital after you are discharged, you can call the unit and asked to speak with the hospitalist on call if the hospitalist that took care of you is not available. Once you are discharged, your primary care physician will handle any further medical issues. Please note that NO REFILLS for any discharge medications will be authorized once you are discharged, as it is imperative that you return to your primary care physician (or establish  a relationship with a primary care physician if you do not have one) for your aftercare needs so that they can reassess your need for medications and monitor your lab values. If you do not have a primary care physician, you can call [phone] for a physician referral.   Increase activity slowly   Complete by:  As directed         Allergies as of 09/01/2017      Reactions   No Known Allergies       Medication List    STOP taking these medications   ibuprofen 200 MG tablet Commonly known as:  ADVIL,MOTRIN   lisinopril 5 MG tablet Commonly known as:  PRINIVIL,ZESTRIL     TAKE these medications   acetaminophen 325 MG tablet Commonly known as:  TYLENOL Take 650 mg by mouth every 6 (six) hours as needed for mild pain or headache.   ALPRAZolam 1 MG tablet Commonly known as:  XANAX Take 1 mg by mouth 4 (four) times daily as needed for anxiety.   amoxicillin-clavulanate 500-125 MG tablet Commonly known as:  AUGMENTIN Take 1 tablet (500 mg total) by mouth 2 (two) times daily for 28 days.   aspirin EC 81 MG tablet Take 81 mg by mouth  daily.   atorvastatin 80 MG tablet Commonly known as:  LIPITOR Take 1 tablet (80 mg total) by mouth daily at 6 PM.   carvedilol 3.125 MG tablet Commonly known as:  COREG Take 1 tablet (3.125 mg total) by mouth 2 (two) times daily.   clopidogrel 75 MG tablet Commonly known as:  PLAVIX Take 1 tablet (75 mg total) by mouth daily with breakfast. Start taking on:  09/02/2017   diphenhydrAMINE 25 mg capsule Commonly known as:  BENADRYL Take 25 mg by mouth daily as needed for allergies.   HUMALOG KWIKPEN 100 UNIT/ML KiwkPen Generic drug:  insulin lispro Inject 20 Units into the skin daily as needed. Per sliding scale. For high sugar   Insulin Detemir 100 UNIT/ML Pen Commonly known as:  LEVEMIR Inject 20 Units into the skin daily at 10 pm. Take 20 units at once daily nighttime for now. Check your glucose levels and increase by 4 units if morning levels stay above 180. And then talk to your PCP. What changed:    how much to take  when to take this  additional instructions   omeprazole 20 MG capsule Commonly known as:  PRILOSEC Take 1 capsule by mouth 2 (two) times daily.   oxyCODONE 5 MG immediate release tablet Commonly known as:  Oxy IR/ROXICODONE Take 1-2 tablets (5-10 mg total) by mouth every 4 (four) hours as needed for moderate pain.   polyethylene glycol packet Commonly known as:  MIRALAX / GLYCOLAX Take 17 g by mouth daily. What changed:    when to take this  reasons to take this   PROAIR HFA 108 (90 Base) MCG/ACT inhaler Generic drug:  albuterol Inhale 2 puffs into the lungs 4 (four) times daily as needed for shortness of breath.        Follow-up Information    [facility] Call on 09/24/2017.   Why:  1 pm for new patient follow up , please bring photo id and list of medications Contact information: [address] [place] [address] [phone]        Waynetta Sandy, MD. Schedule an appointment as soon as possible for  a visit in 4 week(s).   Specialties:  Vascular Surgery,  Cardiology Contact information: [place] [address] [phone]        Rory Percy, MD. Schedule an appointment as soon as possible for a visit in 3 day(s).   Specialty:  Family Medicine Why:  for blood work to check kidney function (Creatinine) Contact information: [address] [place] [address] [phone]           TOTAL DISCHARGE TIME: 35 mins  [facility] Pager [phone]  09/01/2017, 4:18 PM

## 2017-09-01 NOTE — Progress Notes (Addendum)
  Progress Note    09/01/2017 8:45 AM 4 Days Post-Op  Subjective:  C/o tenderness and swelling of right leg; ready to go home  Tm 99.3 now afebrile  Vitals:   08/31/17 2052 09/01/17 0536  BP: 122/69 101/62  Pulse: 92 85  Resp: 17 16  Temp: 99.3 F (37.4 C) 98.7 F (37.1 C)  SpO2: 96% 94%    Physical Exam: General:  No distress Lungs:  Non labored Incisions:  Left groin is soft without hematoma; right PT access looks good. Extremities:  Right foot is warm and well perfused. There is swelling and tenderness throughout right leg compared to left.    Right great toe 09/01/17  Right 5th toe 09/01/17   CBC    Component Value Date/Time   WBC 6.4 09/01/2017 0440   RBC 2.66 (L) 09/01/2017 0440   HGB 7.8 (L) 09/01/2017 0440   HCT 24.3 (L) 09/01/2017 0440   HCT 30.7 (L) 07/27/2015 0601   PLT 175 09/01/2017 0440   MCV 91.4 09/01/2017 0440   MCH 29.3 09/01/2017 0440   MCHC 32.1 09/01/2017 0440   RDW 13.0 09/01/2017 0440   LYMPHSABS 1.5 08/11/2015 1723   MONOABS 0.5 08/11/2015 1723   EOSABS 0.5 08/11/2015 1723   BASOSABS 0.1 08/11/2015 1723    BMET    Component Value Date/Time   NA 135 09/01/2017 0440   K 4.3 09/01/2017 0440   CL 107 09/01/2017 0440   CO2 20 (L) 09/01/2017 0440   GLUCOSE 165 (H) 09/01/2017 0440   BUN 28 (H) 09/01/2017 0440   CREATININE 2.06 (H) 09/01/2017 0440   CALCIUM 7.6 (L) 09/01/2017 0440   GFRNONAA 27 (L) 09/01/2017 0440   GFRAA 31 (L) 09/01/2017 0440    INR    Component Value Date/Time   INR 1.10 08/03/2015 0833     Intake/Output Summary (Last 24 hours) at 09/01/2017 0845 Last data filed at 09/01/2017 7116 Gross per 24 hour  Intake 2647.92 ml  Output 1050 ml  Net 1597.92 ml     Assessment:  52 y.o. female is s/p:  Procedure Performed: 1.  Ultrasound-guided cannulation left common femoral artery 2.  Aortogram with bilateral lower extremity runoff 3.  Ultrasound-guided cannulation right posterior tibial artery 4.  Stent of  SFA takeoff with 5 x 60 mm Innova 5.  Stent of distal SFA proximal popliteal with 5 x 150 mm Innova 6.  Stent of popliteal artery with Gore Tigris 5 x terminating just distal to the joint space 7.  Plain balloon angioplasty of posterior tibial artery with 3 mm balloon 8.  Drug-coated balloon angioplasty of right SFA and popliteal arteries with in.pact 5 x 250 mm balloon 9.  Moderate sedation with fentanyl and Versed 160 minutes  4 Days Post-Op  Plan: -pt right foot is warm and well perfused.  Per pt, right great toe is improving.  -there is swelling and tenderness throughout right leg-stat venous duplex of right leg ordered -left groin is soft without hematoma; right PT access site looks good. -creatinine improved from yesterday and hgb stable   Doreatha Massed, PA-C [facility] [phone] 09/01/2017 8:45 AM  I have independently interviewed and examined patient and agree with PA assessment and plan above.  DVT study is negative.  She is okay for discharge from a vascular standpoint and can follow-up in the office as previously scheduled.  Hyacinth Marcelli C. Randie Heinz, MD [facility] [place] Office: [phone] Pager: [phone]

## 2017-09-01 NOTE — Progress Notes (Signed)
Pt given discharge instructions, prescriptions, and care notes. Pt verbalized understanding AEB no further questions or concerns at this time. IV was discontinued, no redness, pain, or swelling noted at this time. Pt left the floor via wheelchair with staff in stable condition. 

## 2017-09-01 NOTE — Progress Notes (Signed)
VASCULAR LAB PRELIMINARY  PRELIMINARY  PRELIMINARY  PRELIMINARY  Right lower extremity venous duplex completed.    Preliminary report:  There is no DVT or SVT noted in the right lower extremity.    Jhoel Stieg, RVT 09/01/2017, 12:31 PM

## 2017-09-04 ENCOUNTER — Inpatient Hospital Stay (HOSPITAL_COMMUNITY)
Admission: EM | Admit: 2017-09-04 | Discharge: 2017-09-07 | DRG: 292 | Disposition: A | Discharge status: Home / Self Care | Payer: Self-pay | Attending: Family Medicine | Admitting: Family Medicine

## 2017-09-04 ENCOUNTER — Encounter (HOSPITAL_COMMUNITY): Payer: Self-pay | Admitting: *Deleted

## 2017-09-04 ENCOUNTER — Emergency Department (HOSPITAL_COMMUNITY): Payer: Self-pay

## 2017-09-04 ENCOUNTER — Other Ambulatory Visit: Payer: Self-pay

## 2017-09-04 DIAGNOSIS — I96 Gangrene, not elsewhere classified: Secondary | ICD-10-CM | POA: Diagnosis present

## 2017-09-04 DIAGNOSIS — E1152 Type 2 diabetes mellitus with diabetic peripheral angiopathy with gangrene: Secondary | ICD-10-CM | POA: Diagnosis present

## 2017-09-04 DIAGNOSIS — Z951 Presence of aortocoronary bypass graft: Secondary | ICD-10-CM

## 2017-09-04 DIAGNOSIS — L03115 Cellulitis of right lower limb: Secondary | ICD-10-CM

## 2017-09-04 DIAGNOSIS — R778 Other specified abnormalities of plasma proteins: Secondary | ICD-10-CM

## 2017-09-04 DIAGNOSIS — I110 Hypertensive heart disease with heart failure: Principal | ICD-10-CM | POA: Diagnosis present

## 2017-09-04 DIAGNOSIS — I509 Heart failure, unspecified: Secondary | ICD-10-CM

## 2017-09-04 DIAGNOSIS — I5043 Acute on chronic combined systolic (congestive) and diastolic (congestive) heart failure: Secondary | ICD-10-CM | POA: Diagnosis present

## 2017-09-04 DIAGNOSIS — Z87891 Personal history of nicotine dependence: Secondary | ICD-10-CM

## 2017-09-04 DIAGNOSIS — L97519 Non-pressure chronic ulcer of other part of right foot with unspecified severity: Secondary | ICD-10-CM | POA: Diagnosis present

## 2017-09-04 DIAGNOSIS — M868X7 Other osteomyelitis, ankle and foot: Secondary | ICD-10-CM | POA: Diagnosis present

## 2017-09-04 DIAGNOSIS — R748 Abnormal levels of other serum enzymes: Secondary | ICD-10-CM | POA: Diagnosis present

## 2017-09-04 DIAGNOSIS — Z7902 Long term (current) use of antithrombotics/antiplatelets: Secondary | ICD-10-CM

## 2017-09-04 DIAGNOSIS — N179 Acute kidney failure, unspecified: Secondary | ICD-10-CM | POA: Diagnosis present

## 2017-09-04 DIAGNOSIS — Z7982 Long term (current) use of aspirin: Secondary | ICD-10-CM

## 2017-09-04 DIAGNOSIS — E1165 Type 2 diabetes mellitus with hyperglycemia: Secondary | ICD-10-CM | POA: Diagnosis present

## 2017-09-04 DIAGNOSIS — I5042 Chronic combined systolic (congestive) and diastolic (congestive) heart failure: Secondary | ICD-10-CM

## 2017-09-04 DIAGNOSIS — D649 Anemia, unspecified: Secondary | ICD-10-CM | POA: Diagnosis present

## 2017-09-04 DIAGNOSIS — Z89422 Acquired absence of other left toe(s): Secondary | ICD-10-CM

## 2017-09-04 DIAGNOSIS — K219 Gastro-esophageal reflux disease without esophagitis: Secondary | ICD-10-CM | POA: Diagnosis present

## 2017-09-04 DIAGNOSIS — F419 Anxiety disorder, unspecified: Secondary | ICD-10-CM | POA: Diagnosis present

## 2017-09-04 DIAGNOSIS — E1169 Type 2 diabetes mellitus with other specified complication: Secondary | ICD-10-CM | POA: Diagnosis present

## 2017-09-04 DIAGNOSIS — Z79899 Other long term (current) drug therapy: Secondary | ICD-10-CM

## 2017-09-04 DIAGNOSIS — I5023 Acute on chronic systolic (congestive) heart failure: Secondary | ICD-10-CM

## 2017-09-04 DIAGNOSIS — Z79891 Long term (current) use of opiate analgesic: Secondary | ICD-10-CM

## 2017-09-04 DIAGNOSIS — Z955 Presence of coronary angioplasty implant and graft: Secondary | ICD-10-CM

## 2017-09-04 DIAGNOSIS — Z794 Long term (current) use of insulin: Secondary | ICD-10-CM

## 2017-09-04 DIAGNOSIS — E11621 Type 2 diabetes mellitus with foot ulcer: Secondary | ICD-10-CM | POA: Diagnosis present

## 2017-09-04 DIAGNOSIS — I2510 Atherosclerotic heart disease of native coronary artery without angina pectoris: Secondary | ICD-10-CM | POA: Diagnosis present

## 2017-09-04 DIAGNOSIS — R7989 Other specified abnormal findings of blood chemistry: Secondary | ICD-10-CM

## 2017-09-04 DIAGNOSIS — I5031 Acute diastolic (congestive) heart failure: Secondary | ICD-10-CM

## 2017-09-04 DIAGNOSIS — I10 Essential (primary) hypertension: Secondary | ICD-10-CM

## 2017-09-04 LAB — I-STAT TROPONIN, ED: TROPONIN I, POC: 0.18 ng/mL — AB (ref 0.00–0.08)

## 2017-09-04 LAB — CBC
HCT: 26.3 % — ABNORMAL LOW (ref 36.0–46.0)
HEMOGLOBIN: 8.6 g/dL — AB (ref 12.0–15.0)
MCH: 29.5 pg (ref 26.0–34.0)
MCHC: 32.7 g/dL (ref 30.0–36.0)
MCV: 90.1 fL (ref 78.0–100.0)
Platelets: 276 10*3/uL (ref 150–400)
RBC: 2.92 MIL/uL — AB (ref 3.87–5.11)
RDW: 13 % (ref 11.5–15.5)
WBC: 10 10*3/uL (ref 4.0–10.5)

## 2017-09-04 LAB — BASIC METABOLIC PANEL
Anion gap: 8 (ref 5–15)
BUN: 22 mg/dL — ABNORMAL HIGH (ref 6–20)
CALCIUM: 8.8 mg/dL — AB (ref 8.9–10.3)
CO2: 21 mmol/L — ABNORMAL LOW (ref 22–32)
Chloride: 108 mmol/L (ref 101–111)
Creatinine, Ser: 1.31 mg/dL — ABNORMAL HIGH (ref 0.44–1.00)
GFR calc Af Amer: 53 mL/min — ABNORMAL LOW (ref >60)
GFR, EST NON AFRICAN AMERICAN: 46 mL/min — AB (ref >60)
Glucose, Bld: 266 mg/dL — ABNORMAL HIGH (ref 65–99)
POTASSIUM: 3.9 mmol/L (ref 3.5–5.1)
Sodium: 137 mmol/L (ref 135–145)

## 2017-09-04 LAB — GLUCOSE, CAPILLARY
Glucose-Capillary: 235 mg/dL — ABNORMAL HIGH (ref 65–99)
Glucose-Capillary: 200 mg/dL — ABNORMAL HIGH (ref 65–99)

## 2017-09-04 LAB — BRAIN NATRIURETIC PEPTIDE: B NATRIURETIC PEPTIDE 5: 1943.8 pg/mL — AB (ref 0.0–100.0)

## 2017-09-04 MED ORDER — INSULIN DETEMIR 100 UNIT/ML FLEXPEN: 20.0000 [IU] | PEN_INJECTOR | Freq: Every day | SUBCUTANEOUS | Status: DC

## 2017-09-04 MED ORDER — ALBUTEROL SULFATE HFA 108 (90 BASE) MCG/ACT IN AERS: 2.0000 | INHALATION_SPRAY | Freq: Four times a day (QID) | RESPIRATORY_TRACT | Status: DC | PRN

## 2017-09-04 MED ORDER — INSULIN ASPART 100 UNIT/ML ~~LOC~~ SOLN
0.0000 [IU] | Freq: Three times a day (TID) | SUBCUTANEOUS | Status: DC
  Administered 2017-09-04: 3 [IU] via SUBCUTANEOUS
  Administered 2017-09-05: 2 [IU] via SUBCUTANEOUS
  Administered 2017-09-05: 1 [IU] via SUBCUTANEOUS
  Administered 2017-09-05: 2 [IU] via SUBCUTANEOUS
  Administered 2017-09-06 – 2017-09-07 (×4): 3 [IU] via SUBCUTANEOUS

## 2017-09-04 MED ORDER — SODIUM CHLORIDE 0.9% FLUSH
3.0000 mL | Freq: Two times a day (BID) | INTRAVENOUS | Status: DC
  Administered 2017-09-04 – 2017-09-07 (×5): 3 mL via INTRAVENOUS

## 2017-09-04 MED ORDER — PANTOPRAZOLE SODIUM 40 MG PO TBEC
40.0000 mg | DELAYED_RELEASE_TABLET | Freq: Every day | ORAL | Status: DC
  Administered 2017-09-04 – 2017-09-07 (×4): 40 mg via ORAL
  Filled 2017-09-04 (×4): qty 1

## 2017-09-04 MED ORDER — HEPARIN SODIUM (PORCINE) 5000 UNIT/ML IJ SOLN
5000.0000 [IU] | Freq: Three times a day (TID) | INTRAMUSCULAR | Status: DC
  Administered 2017-09-04 – 2017-09-07 (×9): 5000 [IU] via SUBCUTANEOUS
  Filled 2017-09-04 (×9): qty 1

## 2017-09-04 MED ORDER — FUROSEMIDE 10 MG/ML IJ SOLN
40.0000 mg | Freq: Once | INTRAMUSCULAR | Status: AC
  Administered 2017-09-04: 40 mg via INTRAVENOUS
  Filled 2017-09-04: qty 4

## 2017-09-04 MED ORDER — ALBUTEROL SULFATE (2.5 MG/3ML) 0.083% IN NEBU: 2.5000 mg | INHALATION_SOLUTION | Freq: Four times a day (QID) | RESPIRATORY_TRACT | Status: DC | PRN

## 2017-09-04 MED ORDER — ASPIRIN EC 81 MG PO TBEC
81.0000 mg | DELAYED_RELEASE_TABLET | Freq: Every day | ORAL | Status: DC
  Administered 2017-09-05 – 2017-09-07 (×3): 81 mg via ORAL
  Filled 2017-09-04 (×3): qty 1

## 2017-09-04 MED ORDER — INSULIN ASPART 100 UNIT/ML ~~LOC~~ SOLN: 0.0000 [IU] | Freq: Every day | SUBCUTANEOUS | Status: DC

## 2017-09-04 MED ORDER — CARVEDILOL 3.125 MG PO TABS
3.1250 mg | ORAL_TABLET | Freq: Two times a day (BID) | ORAL | Status: DC
  Administered 2017-09-04 – 2017-09-07 (×6): 3.125 mg via ORAL
  Filled 2017-09-04 (×6): qty 1

## 2017-09-04 MED ORDER — ACETAMINOPHEN 650 MG RE SUPP: 650.0000 mg | Freq: Four times a day (QID) | RECTAL | Status: DC | PRN

## 2017-09-04 MED ORDER — AMOXICILLIN-POT CLAVULANATE 500-125 MG PO TABS: 1.0000 | ORAL_TABLET | Freq: Two times a day (BID) | ORAL | Status: DC

## 2017-09-04 MED ORDER — ONDANSETRON HCL 4 MG/2ML IJ SOLN: 4.0000 mg | Freq: Four times a day (QID) | INTRAMUSCULAR | Status: DC | PRN

## 2017-09-04 MED ORDER — ACETAMINOPHEN 325 MG PO TABS
650.0000 mg | ORAL_TABLET | Freq: Four times a day (QID) | ORAL | Status: DC | PRN
  Administered 2017-09-07: 650 mg via ORAL
  Filled 2017-09-04: qty 2

## 2017-09-04 MED ORDER — OXYCODONE HCL 5 MG PO TABS
5.0000 mg | ORAL_TABLET | ORAL | Status: DC | PRN
  Administered 2017-09-04 – 2017-09-07 (×7): 10 mg via ORAL
  Filled 2017-09-04 (×7): qty 2

## 2017-09-04 MED ORDER — SODIUM CHLORIDE 0.9 % IV SOLN
1.0000 g | INTRAVENOUS | Status: DC
  Administered 2017-09-04 – 2017-09-06 (×3): 1 g via INTRAVENOUS
  Filled 2017-09-04 (×4): qty 10

## 2017-09-04 MED ORDER — ATORVASTATIN CALCIUM 80 MG PO TABS
80.0000 mg | ORAL_TABLET | Freq: Every day | ORAL | Status: DC
  Administered 2017-09-04 – 2017-09-06 (×3): 80 mg via ORAL
  Filled 2017-09-04 (×4): qty 1

## 2017-09-04 MED ORDER — POLYETHYLENE GLYCOL 3350 17 G PO PACK
17.0000 g | PACK | Freq: Every day | ORAL | Status: DC
  Administered 2017-09-05 – 2017-09-06 (×2): 17 g via ORAL
  Filled 2017-09-04 (×2): qty 1

## 2017-09-04 MED ORDER — SODIUM CHLORIDE 0.9 % IV SOLN: 250.0000 mL | INTRAVENOUS | Status: DC | PRN

## 2017-09-04 MED ORDER — SODIUM CHLORIDE 0.9% FLUSH: 3.0000 mL | INTRAVENOUS | Status: DC | PRN

## 2017-09-04 MED ORDER — CLOPIDOGREL BISULFATE 75 MG PO TABS
75.0000 mg | ORAL_TABLET | Freq: Every day | ORAL | Status: DC
  Administered 2017-09-05 – 2017-09-07 (×3): 75 mg via ORAL
  Filled 2017-09-04 (×3): qty 1

## 2017-09-04 MED ORDER — ACETAMINOPHEN 325 MG PO TABS: 650.0000 mg | ORAL_TABLET | Freq: Four times a day (QID) | ORAL | Status: DC | PRN

## 2017-09-04 MED ORDER — ONDANSETRON HCL 4 MG PO TABS: 4.0000 mg | ORAL_TABLET | Freq: Four times a day (QID) | ORAL | Status: DC | PRN

## 2017-09-04 MED ORDER — INSULIN DETEMIR 100 UNIT/ML ~~LOC~~ SOLN
10.0000 [IU] | Freq: Every day | SUBCUTANEOUS | Status: DC
  Administered 2017-09-04 – 2017-09-06 (×3): 10 [IU] via SUBCUTANEOUS
  Filled 2017-09-04 (×3): qty 0.1

## 2017-09-04 MED ORDER — INSULIN DETEMIR 100 UNIT/ML ~~LOC~~ SOLN: 20.0000 [IU] | Freq: Every day | SUBCUTANEOUS | Status: DC

## 2017-09-04 MED ORDER — FUROSEMIDE 10 MG/ML IJ SOLN
40.0000 mg | Freq: Two times a day (BID) | INTRAMUSCULAR | Status: DC
  Administered 2017-09-04 – 2017-09-05 (×3): 40 mg via INTRAVENOUS
  Filled 2017-09-04 (×3): qty 4

## 2017-09-04 MED ORDER — DIPHENHYDRAMINE HCL 25 MG PO CAPS: 25.0000 mg | ORAL_CAPSULE | Freq: Every day | ORAL | Status: DC | PRN

## 2017-09-04 MED ORDER — ALPRAZOLAM 0.5 MG PO TABS
1.0000 mg | ORAL_TABLET | Freq: Four times a day (QID) | ORAL | Status: DC | PRN
  Administered 2017-09-04 – 2017-09-05 (×2): 1 mg via ORAL
  Filled 2017-09-04 (×2): qty 2

## 2017-09-04 NOTE — H&P (Signed)
History and Physical    Isabel [name]:[id number] DOB: [birth year] DOA: 09/04/2017  PCP: [name], MD   Patient coming from: Home   Chief Complaint: Lower extremity edema and dyspnea.   HPI: Isabel [name] is a 52 y.o. female with medical history significant of type 2 diabetes mellitus, hypertension and peripheral vascular disease, status post left femoral-popliteal bypass and iliofemoral endarterectomy.  Recent hospitalization May 12 to May 20 4 right great toe osteomyelitis, she underwent stenting to right SFA, popliteal artery and balloon angioplasty of the posterior tibial artery.  She was discharged on Augmentin to complete 4 weeks of therapy.  Since her discharge, patient has been experiencing worsening bilateral lower extremity edema, persistent and severe, with no improving or worsening factors, associated with PND, orthopnea, exertional dyspnea, wheezing, dry cough and increased abdominal girth.  Her dyspnea has been progressive, currently symptomatic with minimal efforts.  Denies any angina.   ED Course: Patient was found volume overloaded, received 40 mg of intravenous furosemide and she was referred for admission for evaluation  Review of Systems:  1. General: No fevers, no chills, no weight gain or weight loss/ generalized weakness 2. ENT: No runny nose or sore throat, no hearing disturbances 3. Pulmonary: Positive dyspnea, cough, wheezing, as mentioned in the HPI 4. Cardiovascular: No angina, claudication, positive lower extremity edema more right than left. Positive pnd and orthopnea 5. Gastrointestinal: No nausea or vomiting, no diarrhea or constipation 6. Hematology: No easy bruisability or frequent infections 7. Urology: No dysuria, hematuria or increased urinary frequency 8. Dermatology: No rashes. 9. Neurology: No seizures or paresthesias 10. Musculoskeletal: No joint pain or deformities  Past Medical History:  Diagnosis Date  . Anxiety   . CHF (congestive  heart failure) (HCC)   . Coronary artery disease    a. s/p CABG in 2015 with LIMA-LAD, seq reverse SVG-OM-dLCx, and reverse SVG-dRCA  . Diabetes mellitus without complication (HCC)    Type 2  . GERD (gastroesophageal reflux disease)   . History of kidney stones   . Peripheral vascular disease [facility])     Past Surgical History:  Procedure Laterality Date  . ABDOMINAL AORTOGRAM W/LOWER EXTREMITY N/A 08/28/2017   Procedure: ABDOMINAL AORTOGRAM W/LOWER EXTREMITY;  Surgeon: Maeola Harman, MD;  Location: [facility] INVASIVE CV LAB;  Service: Cardiovascular;  Laterality: N/A;  . AMPUTATION Left 08/18/2015   Procedure: Left Foot 4th and 5th Ray Amputation With [facility] Placement;  Surgeon: Nadara Mustard, MD;  Location: [facility] OR;  Service: Orthopedics;  Laterality: Left;  . AMPUTATION TOE Left 08/03/2015   Procedure: AMPUTATION LEFT FIFTH TOE;  Surgeon: Fransisco Hertz, MD;  Location: [facility] OR;  Service: Vascular;  Laterality: Left;  . BACK SURGERY    . CORONARY ANGIOPLASTY WITH STENT PLACEMENT    . CORONARY ARTERY BYPASS GRAFT N/A 06/08/2013   Procedure: CORONARY ARTERY BYPASS GRAFTING (CABG) times four using left internal mammary and right saphenous vein.;  Surgeon: Delight Ovens, MD;  Location: [facility] OR;  Service: Open Heart Surgery;  Laterality: N/A;  . ENDARTERECTOMY FEMORAL Left 08/03/2015   Procedure: ENDARTERECTOMY LEFT ILIO-FEMORAL PROFUNDA;  Surgeon: Fransisco Hertz, MD;  Location: [facility] OR;  Service: Vascular;  Laterality: Left;  . FEMORAL-POPLITEAL BYPASS GRAFT Left 08/03/2015   Procedure: BYPASS GRAFT  LEFT FEMORAL TO  BELOW KNEE POPLITEAL ARTERY USING X 80CM GORE PROPATEN GRAFT;  Surgeon: Fransisco Hertz, MD;  Location: [facility] OR;  Service: Vascular;  Laterality: Left;  . INTRAOPERATIVE TRANSESOPHAGEAL ECHOCARDIOGRAM  N/A 06/08/2013   Procedure: INTRAOPERATIVE TRANSESOPHAGEAL ECHOCARDIOGRAM;  Surgeon: Delight Ovens, MD;  Location: [facility] OR;  Service: Open Heart Surgery;  Laterality: N/A;  . KIDNEY STONE SURGERY     . LEFT AND RIGHT HEART CATHETERIZATION WITH CORONARY ANGIOGRAM N/A 06/07/2013   Procedure: LEFT AND RIGHT HEART CATHETERIZATION WITH CORONARY ANGIOGRAM;  Surgeon: Marykay Lex, MD;  Location: [facility] CATH LAB;  Service: Cardiovascular;  Laterality: N/A;  . LOWER EXTREMITY ANGIOGRAM Bilateral 07/27/2015   Procedure: Lower Extremity Angiogram;  Surgeon: Chuck Hint, MD;  Location: [facility] INVASIVE CV LAB;  Service: Cardiovascular;  Laterality: Bilateral;  . PATCH ANGIOPLASTY Left 08/03/2015   Procedure: PATCH ANGIOPLASTY RIGHT ILIO-FEMORAL PROFUNDA USING XENOSURE BIOLOGIC PATCH;  Surgeon: Fransisco Hertz, MD;  Location: [facility] OR;  Service: Vascular;  Laterality: Left;  . PERIPHERAL VASCULAR CATHETERIZATION N/A 07/27/2015   Procedure: Abdominal Aortogram;  Surgeon: Chuck Hint, MD;  Location: [facility] INVASIVE CV LAB;  Service: Cardiovascular;  Laterality: N/A;  . PERIPHERAL VASCULAR INTERVENTION Right 08/28/2017   Procedure: PERIPHERAL VASCULAR INTERVENTION;  Surgeon: Maeola Harman, MD;  Location: [facility] INVASIVE CV LAB;  Service: Cardiovascular;  Laterality: Right;  . SALPINGOOPHORECTOMY     "? side"  . TONSILLECTOMY    . TUBAL LIGATION       reports that she quit smoking about 17 years ago. Her smoking use included cigarettes. She started smoking about 24 years ago. She has a 5.00 pack-year smoking history. She has never used smokeless tobacco. She reports that she drinks alcohol. She reports that she does not use drugs.  Allergies  Allergen Reactions  . No Known Allergies     Family History  Problem Relation Age of Onset  . Heart disease Mother   . Diabetes Mother   . Stroke Mother   . Cancer Father   . Heart disease Father   . Diabetes Father   . Heart disease Brother   . Diabetes Paternal Aunt   . Stroke Maternal Grandmother   . Diabetes Paternal Grandfather      Prior to Admission medications   Medication Sig Start Date End Date Taking? Authorizing Provider    acetaminophen (TYLENOL) 325 MG tablet Take 650 mg by mouth every 6 (six) hours as needed for mild pain or headache.   Yes [provider]  ALPRAZolam Prudy Feeler) 1 MG tablet Take 1 mg by mouth 4 (four) times daily as needed for anxiety.  05/06/13  Yes [provider]  amoxicillin-clavulanate (AUGMENTIN) 500-125 MG tablet Take 1 tablet (500 mg total) by mouth 2 (two) times daily for 28 days. 09/01/17 09/29/17 Yes [name], MD  aspirin EC 81 MG tablet Take 81 mg by mouth daily.   Yes [provider]  atorvastatin (LIPITOR) 80 MG tablet Take 1 tablet (80 mg total) by mouth daily at 6 PM. 06/18/13  Yes Collins, Gina L, PA-C  carvedilol (COREG) 3.125 MG tablet Take 1 tablet (3.125 mg total) by mouth 2 (two) times daily. 11/30/13  Yes Laqueta Linden, MD  clopidogrel (PLAVIX) 75 MG tablet Take 1 tablet (75 mg total) by mouth daily with breakfast. 09/02/17  Yes [name], MD  diphenhydrAMINE (BENADRYL) 25 mg capsule Take 25 mg by mouth daily as needed for allergies.   Yes [provider]  HUMALOG KWIKPEN 100 UNIT/ML KiwkPen Inject 2-20 Units into the skin 3 (three) times daily before meals. Per sliding scale. For high sugar 10/22/13  Yes [provider]  Insulin Detemir (LEVEMIR) 100  UNIT/ML Pen Inject 20 Units into the skin daily at 10 pm. Take 20 units at once daily nighttime for now. Check your glucose levels and increase by 4 units if morning levels stay above 180. And then talk to your PCP. 09/01/17  Yes [name], MD  omeprazole (PRILOSEC) 20 MG capsule Take 1 capsule by mouth 2 (two) times daily. 11/17/13  Yes [provider]  oxyCODONE (OXY IR/ROXICODONE) 5 MG immediate release tablet Take 1-2 tablets (5-10 mg total) by mouth every 4 (four) hours as needed for moderate pain. 09/01/17  Yes [name], MD  polyethylene glycol [facility] / GLYCOLAX) packet Take 17 g by mouth daily. 09/01/17  Yes [name], MD  PROAIR HFA 108 [phone] BASE)  MCG/ACT inhaler Inhale 2 puffs into the lungs 4 (four) times daily as needed for shortness of breath.  01/09/15  Yes [provider]    Physical Exam: Vitals:   09/04/17 0830 09/04/17 0944  BP: (!) 144/77   Pulse: 96   Resp: 18   Temp: 98.5 F (36.9 C)   TempSrc: Oral   SpO2: 99%   Weight:  79.9 kg (176 lb 2 oz)  Height:  5\' 1"  (1.549 m)    Constitutional: deconditioned and ill looking appearing Vitals:   09/04/17 0830 09/04/17 0944  BP: (!) 144/77   Pulse: 96   Resp: 18   Temp: 98.5 F (36.9 C)   TempSrc: Oral   SpO2: 99%   Weight:  79.9 kg (176 lb 2 oz)  Height:  5\' 1"  (1.549 m)   Eyes: PERRL, lids and conjunctivae normal Head normocephalic, nose and ears with no deformities ENMT: Mucous membranes are moist. Posterior pharynx clear of any exudate or lesions.Normal dentition.  Neck: normal, supple, no masses, no thyromegaly Respiratory: positive breath sounds on auscultation bilaterally, no wheezing, but bibasilar rakes. Normal respiratory effort. No accessory muscle use.  Cardiovascular: Regular rate and rhythm, no murmurs / rubs / gallops. Pitting edema +++ right and ++ left, pitting lower extremity edema. 2+ pedal pulses. No carotid bruits.  Abdomen: distended, with no tenderness, no masses palpated. No hepatosplenomegaly. Bowel sounds positive.  Musculoskeletal: no clubbing / cyanosis. No joint deformity upper and lower extremities. Good ROM, no contractures. Normal muscle tone.  Skin: positive necrotic changes on first right toe, non tender, positive erythema at the distal medial leg, tender to palpation, ill defined margins.  Neurologic: CN 2-12 grossly intact. Sensation intact, DTR normal. Strength 5/5 in all 4.     Labs on Admission: I have personally reviewed following labs and imaging studies  CBC: Recent Labs  Lab 08/29/17 0230 08/30/17 0238 08/31/17 0342 09/01/17 0440 09/04/17 0846  WBC 7.9 7.4 6.9 6.4 10.0  HGB 9.5* 8.1* 7.7* 7.8* 8.6*  HCT  28.5* 24.4* 23.8* 24.3* 26.3*  MCV 88.0 89.4 91.2 91.4 90.1  PLT 201 165 164 175 276   Basic Metabolic Panel: Recent Labs  Lab 08/29/17 0230 08/30/17 0238 08/31/17 0342 09/01/17 0440 09/04/17 0846  NA 140 136 135 135 137  K 3.5 3.8 3.8 4.3 3.9  CL 106 105 106 107 108  CO2 25 23 20* 20* 21*  GLUCOSE 171* 176* 78 165* 266*  BUN 22* 30* 31* 28* 22*  CREATININE 1.62* 2.41* 2.54* 2.06* 1.31*  CALCIUM 8.4* 7.6* 7.6* 7.6* 8.8*   GFR: Estimated Creatinine Clearance: 48.1 mL/min (A) (by C-G formula based on SCr of 1.31 mg/dL (H)). Liver Function Tests: No results for input(s): AST, ALT, ALKPHOS, BILITOT,  PROT, ALBUMIN in the last 168 hours. No results for input(s): LIPASE, AMYLASE in the last 168 hours. No results for input(s): AMMONIA in the last 168 hours. Coagulation Profile: No results for input(s): INR, PROTIME in the last 168 hours. Cardiac Enzymes: No results for input(s): CKTOTAL, CKMB, CKMBINDEX, TROPONINI in the last 168 hours. BNP (last 3 results) No results for input(s): PROBNP in the last 8760 hours. HbA1C: No results for input(s): HGBA1C in the last 72 hours. CBG: Recent Labs  Lab 08/31/17 1202 08/31/17 1651 08/31/17 2119 09/01/17 0819 09/01/17 1302  GLUCAP 166* 202* 203* 145* 150*   Lipid Profile: No results for input(s): CHOL, HDL, LDLCALC, TRIG, CHOLHDL, LDLDIRECT in the last 72 hours. Thyroid Function Tests: No results for input(s): TSH, T4TOTAL, FREET4, T3FREE, THYROIDAB in the last 72 hours. Anemia Panel: No results for input(s): VITAMINB12, FOLATE, FERRITIN, TIBC, IRON, RETICCTPCT in the last 72 hours. Urine analysis:    Component Value Date/Time   COLORURINE YELLOW 08/30/2017 1922   APPEARANCEUR CLOUDY (A) 08/30/2017 1922   LABSPEC 1.015 08/30/2017 1922   PHURINE 5.0 08/30/2017 1922   GLUCOSEU NEGATIVE 08/30/2017 1922   HGBUR NEGATIVE 08/30/2017 1922   BILIRUBINUR NEGATIVE 08/30/2017 1922   KETONESUR NEGATIVE 08/30/2017 1922   PROTEINUR 100  (A) 08/30/2017 1922   NITRITE NEGATIVE 08/30/2017 1922   LEUKOCYTESUR NEGATIVE 08/30/2017 1922    Radiological Exams on Admission: Dg Chest 2 View  Result Date: 09/04/2017 CLINICAL DATA:  Shortness of breath, history of CHF and diabetes EXAM: CHEST - 2 VIEW COMPARISON:  Chest x-ray of 07/01/2013 FINDINGS: Aeration of the left lung base has improved with only mild basilar atelectasis remaining. There is cardiomegaly present and there may be mild pulmonary vascular congestion. Median sternotomy sutures remain from prior CABG. IMPRESSION: 1. Decrease in opacity at the left lung base. Mild atelectasis remains. 2. Cardiomegaly and probable mild pulmonary vascular congestion. Electronically Signed   By: Dwyane Dee M.D.   On: 09/04/2017 09:33    EKG: Independently reviewed.  Normal sinus rhythm, right axis deviation, poor R wave progression.  No ST elevations or ST depressions, no significant T wave abnormalities.   Assessment/Plan Active Problems:   Heart failure [facility])  52 year old female who presents with dyspnea, PND, orthopnea and lower extremity edema, recent hospitalization for right great toe osteomyelitis, discharged on oral antibiotic therapy.  On the initial physical examination temperature 98.5, blood pressure 144/77, heart rate 96, respirate rate 18, oxygen saturation 99% on room air.  Positive JVD, lungs with rales at bases bilaterally, heart S1-S2 present rhythmic, no gallops, rubs or murmurs, the abdomen is protuberant nontender, positive lower extremity edema more right and left, not being.  She does have faint erythema on the medial distal right lower extremity. Necrotic first great right toe. Sodium 137, potassium 3.9, chloride 108, bicarb 21, glucose 266, BUN 22, creatinine 1.31, BNP 1943, white count 10.0, hemoglobin 8.6, hematocrit 26.3, platelets 276. Troponin 0 0.18. Chest x-ray with increased interstitial markings bilaterally, small bilateral pleural effusions, fluid in the right  fissure, positive cardiomegaly.  Patient will be admitted to the hospital with working diagnosis of volume overload, complicated by acute cardiogenic pulmonary edema due to suspected diastolic heart failure.   1.  Acute diastolic heart failure.  Present on admission.  Patient will be admitted to the medical unit, she will be placed on a remote telemetry monitor, continue diuresis with furosemide 40 mg intravenously twice daily, strict In and outs and daily weights, to target a negative  fluid balance.  Old records personally reviewed, echocardiography from 2015 with left ventricular ejection fraction 60 to 65%, will obtain a new transthoracic echocardiogram.  Troponin elevation likely due to myocardial ischemia related to volume overload, repeat troponin to follow trend.  Continue to blockade with carvedilol.  2.  Acute cardiogenic pulmonary edema (present on admission).  Patient will be diuresed with furosemide, target negative fluid balance, oximetry monitoring and supplemental oxygen per nasal cannula, target oxygen saturation greater than 92%.  3.  Acute kidney injury.  Patient has resolving acute kidney injury, her peak creatinine was 2.54 on May 19, will continue diuresis with furosemide intravenously, follow-up kidney function in the morning, avoid hypotension and nephrotoxic agents.  4.  Right foot first toe osteomyelitis with cellulitis at the distal medial leg, in the setting of peripheral vascular disease.  Patient will be placed on IV ceftriaxone and continue follow-up antibiotic response.  Patient is afebrile, she does not have significant leukocytosis.  Continue dual antiplatelet therapy with aspirin, clopidogrel and statin therapy with atorvastatin 80 mg daily.  Continue oxycodone as needed for pain. Physical therapy consultation.   5.  Hypertension.  Will continue blood pressure control with carvedilol.   6.  Coronary artery disease.  Patient is chest pain-free, continue atorvastatin,  beta-blockade and aspirin.   7.  Type 2 diabetes mellitus.  Continue glucose coverage and monitoring with insulin sliding scale, admission glucose 166, will order 50% of her long-acting insulin down to 10 units of Levemir, to avoid hypoglycemia.    DVT prophylaxis: heparin  Code Status:  full  Family Communication: I spoke with patient's family at the bedside and all questions were addressed.   Disposition Plan:  telemetry  Consults called:  none  Admission status: Inpatient.     Charmelle Soh Annett Gula MD [facility] Pager [phone]  If 7PM-7AM, please contact night-coverage [email or web address] Password [facility]  09/04/2017, 10:32 AM

## 2017-09-04 NOTE — ED Provider Notes (Signed)
[facility] EMERGENCY DEPARTMENT Provider Note   CSN: [id number] Arrival date & time: 09/04/17  0818     History   Chief Complaint Chief Complaint  Patient presents with  . Shortness of Breath    HPI Isabel [name] is a 52 y.o. female.  The history is provided by the patient.  Shortness of Breath  This is a recurrent problem. The average episode lasts 2 days. The problem occurs continuously.The current episode started 2 days ago. The problem has been gradually worsening. Associated symptoms include cough and leg swelling. Pertinent negatives include no fever, no sore throat, no ear pain, no sputum production, no chest pain, no vomiting, no abdominal pain and no rash. Risk factors: Recent hospitalization with stenting to right leg. She has tried nothing for the symptoms. The treatment provided no relief. Associated medical issues include CAD and heart failure.    Past Medical History:  Diagnosis Date  . Anxiety   . CHF (congestive heart failure) (HCC)   . Coronary artery disease    a. s/p CABG in 2015 with LIMA-LAD, seq reverse SVG-OM-dLCx, and reverse SVG-dRCA  . Diabetes mellitus without complication (HCC)    Type 2  . GERD (gastroesophageal reflux disease)   . History of kidney stones   . Peripheral vascular disease [facility])     Patient Active Problem List   Diagnosis Date Noted  . Diabetic foot ulcer (HCC) 08/24/2017  . Atherosclerosis of native arteries of left leg with ulceration of other part of foot (HCC) 09/22/2015  . Post-operative pain 09/22/2015  . Pain of left lower extremity 09/22/2015  . Atherosclerosis of native arteries of the extremities with gangrene (HCC) 08/25/2015  . PVD (peripheral vascular disease) (HCC) 08/03/2015  . Gangrene of toe (HCC) 07/26/2015  . Gangrene (HCC) 07/26/2015  . 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 Mild aortic stenosis 06/05/2013  . DM (diabetes mellitus), type 2, uncontrolled (HCC) 06/02/2013  . CAD (coronary artery  disease) 06/02/2013  . Anemia 06/02/2013  . Constipation 06/02/2013    Past Surgical History:  Procedure Laterality Date  . ABDOMINAL AORTOGRAM W/LOWER EXTREMITY N/A 08/28/2017   Procedure: ABDOMINAL AORTOGRAM W/LOWER EXTREMITY;  Surgeon: Maeola Harman, MD;  Location: [facility] INVASIVE CV LAB;  Service: Cardiovascular;  Laterality: N/A;  . AMPUTATION Left 08/18/2015   Procedure: Left Foot 4th and 5th Ray Amputation With [facility] Placement;  Surgeon: Nadara Mustard, MD;  Location: [facility] OR;  Service: Orthopedics;  Laterality: Left;  . AMPUTATION TOE Left 08/03/2015   Procedure: AMPUTATION LEFT FIFTH TOE;  Surgeon: Fransisco Hertz, MD;  Location: [facility] OR;  Service: Vascular;  Laterality: Left;  . BACK SURGERY    . CORONARY ANGIOPLASTY WITH STENT PLACEMENT    . CORONARY ARTERY BYPASS GRAFT N/A 06/08/2013   Procedure: CORONARY ARTERY BYPASS GRAFTING (CABG) times four using left internal mammary and right saphenous vein.;  Surgeon: Delight Ovens, MD;  Location: [facility] OR;  Service: Open Heart Surgery;  Laterality: N/A;  . ENDARTERECTOMY FEMORAL Left 08/03/2015   Procedure: ENDARTERECTOMY LEFT ILIO-FEMORAL PROFUNDA;  Surgeon: Fransisco Hertz, MD;  Location: [facility] OR;  Service: Vascular;  Laterality: Left;  . FEMORAL-POPLITEAL BYPASS GRAFT Left 08/03/2015   Procedure: BYPASS GRAFT  LEFT FEMORAL TO  BELOW KNEE POPLITEAL ARTERY USING X 80CM GORE PROPATEN GRAFT;  Surgeon: Fransisco Hertz, MD;  Location: [facility] OR;  Service: Vascular;  Laterality: Left;  . INTRAOPERATIVE TRANSESOPHAGEAL ECHOCARDIOGRAM N/A 06/08/2013   Procedure: INTRAOPERATIVE TRANSESOPHAGEAL ECHOCARDIOGRAM;  Surgeon: Delight Ovens, MD;  Location: [facility] OR;  Service: Open Heart Surgery;  Laterality: N/A;  . KIDNEY STONE SURGERY    . LEFT AND RIGHT HEART CATHETERIZATION WITH CORONARY ANGIOGRAM N/A 06/07/2013   Procedure: LEFT AND RIGHT HEART CATHETERIZATION WITH CORONARY ANGIOGRAM;  Surgeon: Marykay Lex, MD;  Location: [facility] CATH LAB;  Service: Cardiovascular;   Laterality: N/A;  . LOWER EXTREMITY ANGIOGRAM Bilateral 07/27/2015   Procedure: Lower Extremity Angiogram;  Surgeon: Chuck Hint, MD;  Location: [facility] INVASIVE CV LAB;  Service: Cardiovascular;  Laterality: Bilateral;  . PATCH ANGIOPLASTY Left 08/03/2015   Procedure: PATCH ANGIOPLASTY RIGHT ILIO-FEMORAL PROFUNDA USING XENOSURE BIOLOGIC PATCH;  Surgeon: Fransisco Hertz, MD;  Location: [facility] OR;  Service: Vascular;  Laterality: Left;  . PERIPHERAL VASCULAR CATHETERIZATION N/A 07/27/2015   Procedure: Abdominal Aortogram;  Surgeon: Chuck Hint, MD;  Location: [facility] INVASIVE CV LAB;  Service: Cardiovascular;  Laterality: N/A;  . PERIPHERAL VASCULAR INTERVENTION Right 08/28/2017   Procedure: PERIPHERAL VASCULAR INTERVENTION;  Surgeon: Maeola Harman, MD;  Location: [facility] INVASIVE CV LAB;  Service: Cardiovascular;  Laterality: Right;  . SALPINGOOPHORECTOMY     "? side"  . TONSILLECTOMY    . TUBAL LIGATION       OB History   None      Home Medications    Prior to Admission medications   Medication Sig Start Date End Date Taking? Authorizing Provider  acetaminophen (TYLENOL) 325 MG tablet Take 650 mg by mouth every 6 (six) hours as needed for mild pain or headache.    [provider]  ALPRAZolam Prudy Feeler) 1 MG tablet Take 1 mg by mouth 4 (four) times daily as needed for anxiety.  05/06/13   [provider]  amoxicillin-clavulanate (AUGMENTIN) 500-125 MG tablet Take 1 tablet (500 mg total) by mouth 2 (two) times daily for 28 days. 09/01/17 09/29/17  [name], MD  aspirin EC 81 MG tablet Take 81 mg by mouth daily.    [provider]  atorvastatin (LIPITOR) 80 MG tablet Take 1 tablet (80 mg total) by mouth daily at 6 PM. 06/18/13   Collins, Almira Coaster L, PA-C  carvedilol (COREG) 3.125 MG tablet Take 1 tablet (3.125 mg total) by mouth 2 (two) times daily. 11/30/13   Laqueta Linden, MD  clopidogrel (PLAVIX) 75 MG tablet Take 1 tablet (75 mg total) by mouth  daily with breakfast. 09/02/17   [name], MD  diphenhydrAMINE (BENADRYL) 25 mg capsule Take 25 mg by mouth daily as needed for allergies.    [provider]  HUMALOG KWIKPEN 100 UNIT/ML KiwkPen Inject 20 Units into the skin daily as needed. Per sliding scale. For high sugar 10/22/13   [provider]  Insulin Detemir (LEVEMIR) 100 UNIT/ML Pen Inject 20 Units into the skin daily at 10 pm. Take 20 units at once daily nighttime for now. Check your glucose levels and increase by 4 units if morning levels stay above 180. And then talk to your PCP. 09/01/17   [name], MD  omeprazole (PRILOSEC) 20 MG capsule Take 1 capsule by mouth 2 (two) times daily. 11/17/13   [provider]  oxyCODONE (OXY IR/ROXICODONE) 5 MG immediate release tablet Take 1-2 tablets (5-10 mg total) by mouth every 4 (four) hours as needed for moderate pain. 09/01/17   [name], MD  polyethylene glycol [facility] Ethelene Hal) packet Take 17 g by mouth daily. 09/01/17   [name], MD  PROAIR HFA 108 [phone] BASE) MCG/ACT inhaler  Inhale 2 puffs into the lungs 4 (four) times daily as needed for shortness of breath.  01/09/15   [provider]    Family History Family History  Problem Relation Age of Onset  . Heart disease Mother   . Diabetes Mother   . Stroke Mother   . Cancer Father   . Heart disease Father   . Diabetes Father   . Heart disease Brother   . Diabetes Paternal Aunt   . Stroke Maternal Grandmother   . Diabetes Paternal Grandfather     Social History Social History   Tobacco Use  . Smoking status: Former Smoker    Packs/day: 0.50    Years: 10.00    Pack years: 5.00    Types: Cigarettes    Start date: 12/26/1992    Last attempt to quit: 04/15/2000    Years since quitting: 17.4  . Smokeless tobacco: Never Used  . Tobacco comment: QUIT SMOKING YEARS AGO "  Substance Use Topics  . Alcohol use: Yes    Alcohol/week: 0.0 oz    Comment: 07/27/2015 "might have a  couple drinks twice/month"  . Drug use: No     Allergies   No known allergies   Review of Systems Review of Systems  Constitutional: Negative for chills and fever.  HENT: Negative for ear pain and sore throat.   Eyes: Negative for pain and visual disturbance.  Respiratory: Positive for cough and shortness of breath. Negative for sputum production.   Cardiovascular: Positive for leg swelling. Negative for chest pain and palpitations.  Gastrointestinal: Positive for abdominal distention. Negative for abdominal pain and vomiting.  Genitourinary: Negative for dysuria and hematuria.  Musculoskeletal: Negative for arthralgias and back pain.  Skin: Negative for color change and rash.  Neurological: Negative for seizures and syncope.  All other systems reviewed and are negative.    Physical Exam Updated Vital Signs BP (!) 144/77 (BP Location: Left Arm)   Pulse 96   Temp 98.5 F (36.9 C) (Oral)   Resp 18   Ht 5\' 1"  (1.549 m)   Wt 79.9 kg (176 lb 2 oz)   SpO2 99%   BMI 33.28 kg/m   Physical Exam  Constitutional: She appears well-developed and well-nourished. No distress.  HENT:  Head: Normocephalic and atraumatic.  Eyes: Conjunctivae are normal.  Neck: Neck supple.  Cardiovascular: Normal rate and regular rhythm.  No murmur heard. Pulmonary/Chest: Effort normal and breath sounds normal. No respiratory distress. She has no decreased breath sounds.  Abdominal: Soft. There is no tenderness.  Musculoskeletal:       Right lower leg: She exhibits edema. She exhibits no tenderness.       Left lower leg: She exhibits edema. She exhibits no tenderness.  Neurological: She is alert.  Skin: Skin is warm and dry.  Mottled appearance to LLE which is chronic per patient.  Psychiatric: She has a normal mood and affect.  Nursing note and vitals reviewed.    ED Treatments / Results  Labs (all labs ordered are listed, but only abnormal results are displayed) Labs Reviewed  BASIC  METABOLIC PANEL - Abnormal; Notable for the following components:      Result Value   CO2 21 (*)    Glucose, Bld 266 (*)    BUN 22 (*)    Creatinine, Ser 1.31 (*)    Calcium 8.8 (*)    GFR calc non Af Amer 46 (*)    GFR calc Af Amer 53 (*)  All other components within normal limits  CBC - Abnormal; Notable for the following components:   RBC 2.92 (*)    Hemoglobin 8.6 (*)    HCT 26.3 (*)    All other components within normal limits  BRAIN NATRIURETIC PEPTIDE - Abnormal; Notable for the following components:   B Natriuretic Peptide 1,943.8 (*)    All other components within normal limits  I-STAT TROPONIN, ED - Abnormal; Notable for the following components:   Troponin i, poc 0.18 (*)    All other components within normal limits    EKG EKG Interpretation  Date/Time:  Thursday Sep 04 2017 08:25:59 EDT Ventricular Rate:  98 PR Interval:  144 QRS Duration: 84 QT Interval:  358 QTC Calculation: 457 R Axis:   93 Text Interpretation:  Poor data quality, interpretation may be adversely affected Normal sinus rhythm Rightward axis Septal infarct , age undetermined Abnormal ECG Confirmed by Loren Racer ([id number]) on 09/04/2017 9:43:55 AM  Radiology Dg Chest 2 View  Result Date: 09/04/2017 CLINICAL DATA:  Shortness of breath, history of CHF and diabetes EXAM: CHEST - 2 VIEW COMPARISON:  Chest x-ray of 07/01/2013 FINDINGS: Aeration of the left lung base has improved with only mild basilar atelectasis remaining. There is cardiomegaly present and there may be mild pulmonary vascular congestion. Median sternotomy sutures remain from prior CABG. IMPRESSION: 1. Decrease in opacity at the left lung base. Mild atelectasis remains. 2. Cardiomegaly and probable mild pulmonary vascular congestion. Electronically Signed   By: Dwyane Dee M.D.   On: 09/04/2017 09:33    Procedures Procedures (including critical care time)  Medications Ordered in ED Medications  furosemide (LASIX) injection 40  mg (has no administration in time range)     Initial Impression / Assessment and Plan / ED Course  I have reviewed the triage vital signs and the nursing notes.  Pertinent labs & imaging results that were available during my care of the patient were reviewed by me and considered in my medical decision making (see chart for details).    Patient is a 51 year old female with history as above who presents with 2 days of shortness of breath and increased bilateral leg swelling.  She is also having abdominal distention.  She was just discharged this week after being admitted for peripheral vascular disease and stenting in her right leg.  Right leg DVT study prior to discharge.  She denies any chest pain.  Since being home, she is having orthopnea and exertional dyspnea.  She does have bilateral lower extremity edema here.  Her lungs are clear.  Chest x-ray does not show any significant pulmonary edema.  EKG does not show any acute ischemic changes.  Her laboratory work-up here is notable for elevated troponin at 0.18 and elevated BNP at 1900.  I suspect her presentation today is due to congestive heart failure exacerbation.  40 mg of IV Lasix given in the ED.  She will be admitted to the hospitalist for further management.  Final Clinical Impressions(s) / ED Diagnoses   Final diagnoses:  Acute on chronic congestive heart failure, unspecified heart failure type [facility])  Elevated troponin    ED Discharge Orders    None       Lennette Bihari, MD 09/04/17 1011    Loren Racer, MD 09/05/17 1640

## 2017-09-04 NOTE — ED Notes (Signed)
Pt transported to xray 

## 2017-09-04 NOTE — ED Notes (Signed)
purxwick in place suction ready

## 2017-09-04 NOTE — ED Triage Notes (Signed)
Patient to ED c/o SOB, abdominal distention, and increased bilateral leg swelling past couple days - was discharged Monday after 8-day stay for similar, but reports the SOB is new. Patient has slightly labored breathing after talking and with minimal exertion. Denies CP or lightheadedness, no fevers. Pt states swelling started in R leg and she had DVT ruled out while here and now has swelling in both legs and stomach. Resp equal and unlabored at rest. Skin warm/dry.

## 2017-09-05 LAB — COMPREHENSIVE METABOLIC PANEL
ALT: 18 U/L (ref 14–54)
AST: 12 U/L — AB (ref 15–41)
Albumin: 2 g/dL — ABNORMAL LOW (ref 3.5–5.0)
Alkaline Phosphatase: 216 U/L — ABNORMAL HIGH (ref 38–126)
Anion gap: 9 (ref 5–15)
BILIRUBIN TOTAL: 0.4 mg/dL (ref 0.3–1.2)
BUN: 23 mg/dL — AB (ref 6–20)
CHLORIDE: 106 mmol/L (ref 101–111)
CO2: 25 mmol/L (ref 22–32)
CREATININE: 1.28 mg/dL — AB (ref 0.44–1.00)
Calcium: 8.6 mg/dL — ABNORMAL LOW (ref 8.9–10.3)
GFR calc Af Amer: 55 mL/min — ABNORMAL LOW (ref >60)
GFR, EST NON AFRICAN AMERICAN: 47 mL/min — AB (ref >60)
Glucose, Bld: 152 mg/dL — ABNORMAL HIGH (ref 65–99)
Potassium: 3.6 mmol/L (ref 3.5–5.1)
Sodium: 140 mmol/L (ref 135–145)
Total Protein: 5.5 g/dL — ABNORMAL LOW (ref 6.5–8.1)

## 2017-09-05 LAB — GLUCOSE, CAPILLARY
GLUCOSE-CAPILLARY: 136 mg/dL — AB (ref 65–99)
GLUCOSE-CAPILLARY: 198 mg/dL — AB (ref 65–99)
GLUCOSE-CAPILLARY: 161 mg/dL — AB (ref 65–99)
GLUCOSE-CAPILLARY: 231 mg/dL — AB (ref 65–99)

## 2017-09-05 LAB — CBC
HCT: 24.2 % — ABNORMAL LOW (ref 36.0–46.0)
Hemoglobin: 7.7 g/dL — ABNORMAL LOW (ref 12.0–15.0)
MCH: 28.8 pg (ref 26.0–34.0)
MCHC: 31.8 g/dL (ref 30.0–36.0)
MCV: 90.6 fL (ref 78.0–100.0)
Platelets: 262 10*3/uL (ref 150–400)
RBC: 2.67 MIL/uL — ABNORMAL LOW (ref 3.87–5.11)
RDW: 13.1 % (ref 11.5–15.5)
WBC: 7 10*3/uL (ref 4.0–10.5)

## 2017-09-05 NOTE — Care Management Note (Addendum)
Case Management Note  Patient Details  Name: Isabel [name] MRN: [id number] Date of Birth: 1965/11/28  Subjective/Objective:   History of type 2 diabetes mellitus, HTN, peripheral vascular disease, status post left femoral-popliteal bypass and iliofemoral endarterectomy. Admitted for Acute diastolic heart failure.  Present on admission.           Action/Plan: Prior to admission patient lived at home alone.  At discharge patient plans to return to same living situation. Review of chart shows patient was provided [place] Letter on last Admission.  NCM will continue to follow for discharge transition needs.   Expected Discharge Date:  09/08/17               Expected Discharge Plan:  Home/Self Care  In-House Referral:   N/A  Discharge planning Services  CM Consult  Status of Service:  In process, will continue to follow  Yancey Flemings, RN 09/05/2017, 12:54 PM

## 2017-09-05 NOTE — Progress Notes (Signed)
Pt is stable, denies chest pain, SOB Refused [facility], said that she wanted to have second opinion from vascular doctor and RN paged MD regarding her concerns, will continue to monitor the patient  Lonia Farber, RN

## 2017-09-05 NOTE — Progress Notes (Addendum)
Patient Demographics:    Isabel [name], is a 52 y.o. female, DOB - 07/19/1965, PRF:[id number]  Admit date - 09/04/2017   Admitting Physician Mauricio Annett Gula, MD  Outpatient Primary MD for the patient is [name], MD  LOS - 1   Chief Complaint  Patient presents with  . Shortness of Breath        Subjective:    [name] today has no fevers, no emesis,  No chest pain, no fevers, lSOB is better  Assessment  & Plan :    Active Problems:   Heart failure [facility])  52 y.o. female with medical history significant of type 2 diabetes mellitus, hypertension and peripheral vascular disease, status post left femoral-popliteal bypass and iliofemoral endarterectomy admitted on 09/04/2017 with diastolic CHF exacerbation    1)HFpEF--- shortness of breath is better, lower extremity edema persists, last known EF is 60 to 65% based on echo from 2015, will repeat echo, continue Lasix 40 mg IV every 12 hours, daily weights, fluid input and output monitoring, monitor electrolytes, patient is on Coreg 3.125 mg twice daily, will not titrate beta-blockers upward at this time given CHF exacerbation  2)PAD-status post left femoral-popliteal bypass and iliofemoral endarterectomy , continue aspirin, Lipitor 80 mg, as well as Plavix 75 mg daily  3)DM-uncontrolled, A1c was 10.9 in  February 2019, give Levemir insulin 10 units nightly,, Use Novolog/Humalog Sliding scale insulin with Accu-Cheks/Fingersticks as ordered  4)Rt Big Toe Dry Gangrene/Foot infection-patient was on Augmentin prior to admission, currently on Rocephin  5) chronic anemia-hemoglobin currently 7.7 we will watch closely, transfuse as clinically indicated  Code Status : full code   Disposition Plan  : home   Consults  :  n/a  DVT Prophylaxis  :  - Heparin -  Lab Results  Component Value Date   PLT 262 09/05/2017    Inpatient  Medications  Scheduled Meds: . aspirin EC  81 mg Oral Daily  . atorvastatin  80 mg Oral q1800  . carvedilol  3.125 mg Oral BID  . clopidogrel  75 mg Oral Q breakfast  . furosemide  40 mg Intravenous Q12H  . heparin  5,000 Units Subcutaneous Q8H  . insulin aspart  0-9 Units Subcutaneous TID WC  . insulin detemir  10 Units Subcutaneous QHS  . pantoprazole  40 mg Oral Daily  . polyethylene glycol  17 g Oral Daily  . sodium chloride flush  3 mL Intravenous Q12H   Continuous Infusions: . sodium chloride    . cefTRIAXone (ROCEPHIN)  IV Stopped (09/05/17 1150)   PRN Meds:.sodium chloride, acetaminophen **OR** acetaminophen, albuterol, ALPRAZolam, diphenhydrAMINE, ondansetron **OR** ondansetron (ZOFRAN) IV, oxyCODONE, sodium chloride flush    Anti-infectives (From admission, onward)   Start     Dose/Rate Route Frequency Ordered Stop   09/04/17 1445  amoxicillin-clavulanate (AUGMENTIN) 500-125 MG per tablet 500 mg  Status:  Discontinued     1 tablet Oral 2 times daily 09/04/17 1431 09/04/17 1455   09/04/17 1130  cefTRIAXone (ROCEPHIN) 1 g in sodium chloride 0.9 % 100 mL IVPB     1 g 200 mL/hr over 30 Minutes Intravenous Every 24 hours 09/04/17 1123          Objective:   Vitals:   09/05/17  0503 09/05/17 0800 09/05/17 1153 09/05/17 1550  BP:  110/60 134/70 133/61  Pulse:   85 83  Resp:   18 18  Temp:   98.3 F (36.8 C) 98.4 F (36.9 C)  TempSrc:   Oral Oral  SpO2:   100% 98%  Weight: 78.1 kg (172 lb 3.2 oz)     Height:        Wt Readings from Last 3 Encounters:  09/05/17 78.1 kg (172 lb 3.2 oz)  08/30/17 75.1 kg (165 lb 8 oz)  05/22/17 70.8 kg (156 lb)     Intake/Output Summary (Last 24 hours) at 09/05/2017 1747 Last data filed at 09/05/2017 1550 Gross per 24 hour  Intake 720 ml  Output 4450 ml  Net -3730 ml   Physical Exam  Gen:- Awake Alert,  In no apparent distress  HEENT:- [place].AT, No sclera icterus Neck-Supple Neck,No JVD,.  Lungs-faint bibasilar rales, good  air movement CV- S1, S2 normal, CABG scar Abd-  +ve B.Sounds, Abd Soft, No tenderness,    Extremity/Skin:- + 1 pitting edema,   , right big toe with dry gangrene Psych-affect is appropriate, oriented x3 Neuro-no new focal deficits, no tremors   Data Review:   Micro Results No results found for this or any previous visit (from the past 240 hour(s)).  Radiology Reports Dg Chest 2 View  Result Date: 09/04/2017 CLINICAL DATA:  Shortness of breath, history of CHF and diabetes EXAM: CHEST - 2 VIEW COMPARISON:  Chest x-ray of 07/01/2013 FINDINGS: Aeration of the left lung base has improved with only mild basilar atelectasis remaining. There is cardiomegaly present and there may be mild pulmonary vascular congestion. Median sternotomy sutures remain from prior CABG. IMPRESSION: 1. Decrease in opacity at the left lung base. Mild atelectasis remains. 2. Cardiomegaly and probable mild pulmonary vascular congestion. Electronically Signed   By: Dwyane Dee M.D.   On: 09/04/2017 09:33   US Renal  Result Date: 08/30/2017 CLINICAL DATA:  Acute onset of renal failure. EXAM: RENAL / URINARY TRACT ULTRASOUND COMPLETE COMPARISON:  Abdominal ultrasound performed 12/09/2005 FINDINGS: Right Kidney: Length: 14.8 cm. Slightly increased parenchymal echogenicity is noted. No mass or hydronephrosis visualized. Left Kidney: Length: 13.7 cm. Slightly increased parenchymal echogenicity is noted. No mass or hydronephrosis visualized. Bladder: Appears normal for degree of bladder distention. IMPRESSION: No evidence of hydronephrosis. Slightly increased renal parenchymal echogenicity raises concern for medical renal disease. Electronically Signed   By: Roanna Raider M.D.   On: 08/30/2017 21:53   US Arterial Abi (screening Lower Extremity)  Result Date: 08/25/2017 CLINICAL DATA:  Right great toe ulceration. Right foot pain. Previous left toe amputations. Previous left iliofemoral endarterectomy and fem-pop bypass graft.  Diabetes. EXAM: NONINVASIVE PHYSIOLOGIC VASCULAR STUDY OF BILATERAL LOWER EXTREMITIES TECHNIQUE: Evaluation of both lower extremities were performed at rest, including calculation of ankle-brachial indices with single level Doppler, pressure and pulse volume recording. COMPARISON:  07/20/2015 FINDINGS: Right ABI:  0.42 (previously 0.87) Left ABI:  0.95 (previously 0.42) Right Lower Extremity:  Monophasic distal arterial waveforms. Left Lower Extremity:  Biphasic distal arterial waveforms. IMPRESSION: 1. Severe progressive arterial occlusive disease in the right lower extremity. 2. Interval improvement in left lower extremity perfusion to near normal at rest. Electronically Signed   By: Corlis Leak M.D.   On: 08/25/2017 13:56   Dg Toe Great Right  Result Date: 08/24/2017 CLINICAL DATA:  Cellulitis of the right great toe. EXAM: RIGHT GREAT TOE COMPARISON:  None. FINDINGS: There is no evidence of fracture  or dislocation. There is mild osteopenia with slight cortical irregularity of the distal tuft of the right first distal phalanx. There is soft tissue swelling of the distal right great toe. IMPRESSION: Mild osteopenia with slight cortical irregularity of the distal tuft of the right first distal phalanx, suspicious for underlying osteomyelitis. Electronically Signed   By: Sherian Rein M.D.   On: 08/24/2017 13:14     CBC Recent Labs  Lab 08/30/17 0238 08/31/17 0342 09/01/17 0440 09/04/17 0846 09/05/17 0632  WBC 7.4 6.9 6.4 10.0 7.0  HGB 8.1* 7.7* 7.8* 8.6* 7.7*  HCT 24.4* 23.8* 24.3* 26.3* 24.2*  PLT 165 164 175 276 262  MCV 89.4 91.2 91.4 90.1 90.6  MCH 29.7 29.5 29.3 29.5 28.8  MCHC 33.2 32.4 32.1 32.7 31.8  RDW 13.0 12.9 13.0 13.0 13.1    Chemistries  Recent Labs  Lab 08/30/17 0238 08/31/17 0342 09/01/17 0440 09/04/17 0846 09/05/17 0632  NA 136 135 135 137 140  K 3.8 3.8 4.3 3.9 3.6  CL 105 106 107 108 106  CO2 23 20* 20* 21* 25  GLUCOSE 176* 78 165* 266* 152*  BUN 30* 31* 28*  22* 23*  CREATININE 2.41* 2.54* 2.06* 1.31* 1.28*  CALCIUM 7.6* 7.6* 7.6* 8.8* 8.6*  AST  --   --   --   --  12*  ALT  --   --   --   --  18  ALKPHOS  --   --   --   --  216*  BILITOT  --   --   --   --  0.4   ------------------------------------------------------------------------------------------------------------------ No results for input(s): CHOL, HDL, LDLCALC, TRIG, CHOLHDL, LDLDIRECT in the last 72 hours.  Lab Results  Component Value Date   HGBA1C 10.9 (H) 05/22/2017   ------------------------------------------------------------------------------------------------------------------ No results for input(s): TSH, T4TOTAL, T3FREE, THYROIDAB in the last 72 hours.  Invalid input(s): FREET3 ------------------------------------------------------------------------------------------------------------------ No results for input(s): VITAMINB12, FOLATE, FERRITIN, TIBC, IRON, RETICCTPCT in the last 72 hours.  Coagulation profile No results for input(s): INR, PROTIME in the last 168 hours.  No results for input(s): DDIMER in the last 72 hours.  Cardiac Enzymes No results for input(s): CKMB, TROPONINI, MYOGLOBIN in the last 168 hours.  Invalid input(s): CK ------------------------------------------------------------------------------------------------------------------    Component Value Date/Time   BNP 1,943.8 (H) 09/04/2017 7829     Shon Hale M.D on 09/05/2017 at 5:47 PM  Between 7am to 7pm - Pager - [phone]  After 7pm go to [email or web address] - password [email or web address]  [facility] -  Office  [phone]   Voice Recognition Reubin Milan dictation system was used to create this note, attempts have been made to correct errors. Please contact the author with questions and/or clarifications.

## 2017-09-06 ENCOUNTER — Inpatient Hospital Stay (HOSPITAL_COMMUNITY): Payer: Self-pay

## 2017-09-06 DIAGNOSIS — I5023 Acute on chronic systolic (congestive) heart failure: Secondary | ICD-10-CM

## 2017-09-06 LAB — BASIC METABOLIC PANEL
ANION GAP: 9 (ref 5–15)
BUN: 25 mg/dL — ABNORMAL HIGH (ref 6–20)
CO2: 25 mmol/L (ref 22–32)
Calcium: 8.3 mg/dL — ABNORMAL LOW (ref 8.9–10.3)
Chloride: 104 mmol/L (ref 101–111)
Creatinine, Ser: 1.32 mg/dL — ABNORMAL HIGH (ref 0.44–1.00)
GFR calc Af Amer: 53 mL/min — ABNORMAL LOW (ref >60)
GFR, EST NON AFRICAN AMERICAN: 45 mL/min — AB (ref >60)
GLUCOSE: 265 mg/dL — AB (ref 65–99)
POTASSIUM: 3.9 mmol/L (ref 3.5–5.1)
Sodium: 138 mmol/L (ref 135–145)

## 2017-09-06 LAB — CBC
HCT: 24.8 % — ABNORMAL LOW (ref 36.0–46.0)
Hemoglobin: 7.9 g/dL — ABNORMAL LOW (ref 12.0–15.0)
MCH: 29.7 pg (ref 26.0–34.0)
MCHC: 31.9 g/dL (ref 30.0–36.0)
MCV: 93.2 fL (ref 78.0–100.0)
Platelets: 284 10*3/uL (ref 150–400)
RBC: 2.66 MIL/uL — AB (ref 3.87–5.11)
RDW: 13.2 % (ref 11.5–15.5)
WBC: 7.8 10*3/uL (ref 4.0–10.5)

## 2017-09-06 LAB — GLUCOSE, CAPILLARY
GLUCOSE-CAPILLARY: 218 mg/dL — AB (ref 65–99)
GLUCOSE-CAPILLARY: 232 mg/dL — AB (ref 65–99)
Glucose-Capillary: 218 mg/dL — ABNORMAL HIGH (ref 65–99)
Glucose-Capillary: 245 mg/dL — ABNORMAL HIGH (ref 65–99)

## 2017-09-06 LAB — ECHOCARDIOGRAM COMPLETE
Height: 61 in
Weight: 2793.67 oz

## 2017-09-06 LAB — PREPARE RBC (CROSSMATCH)

## 2017-09-06 MED ORDER — FUROSEMIDE 10 MG/ML IJ SOLN
40.0000 mg | Freq: Once | INTRAMUSCULAR | Status: AC
  Administered 2017-09-06: 40 mg via INTRAVENOUS
  Filled 2017-09-06: qty 4

## 2017-09-06 MED ORDER — SODIUM CHLORIDE 0.9 % IV SOLN
Freq: Once | INTRAVENOUS | Status: AC
  Administered 2017-09-06: 18:00:00 via INTRAVENOUS

## 2017-09-06 MED ORDER — FUROSEMIDE 10 MG/ML IJ SOLN
40.0000 mg | Freq: Two times a day (BID) | INTRAMUSCULAR | Status: DC
  Administered 2017-09-06 – 2017-09-07 (×3): 40 mg via INTRAVENOUS
  Filled 2017-09-06 (×3): qty 4

## 2017-09-06 NOTE — Progress Notes (Signed)
Patient Demographics:    Isabel Isabel [name], is a 52 y.o. Isabel [name], DOB - March 11, 1966, ZOX:[id number]  Admit date - 09/04/2017   Admitting Physician Mauricio Annett Gula, MD  Outpatient Primary MD for the patient is [name], MD  LOS - 2   Chief Complaint  Patient presents with  . Shortness of Breath        Subjective:    Isabel Isabel [name] today has no fevers, no emesis,  No chest pain, no fevers, and is less short of breath, however dyspnea on exertion persist  Assessment  & Plan :    Active Problems:   Heart failure [facility])  Brief summary 52 y.o. Isabel [name] with medical history significant of type 2 diabetes mellitus, hypertension and peripheral vascular disease, status post left femoral-popliteal bypass and iliofemoral endarterectomy admitted on 09/04/2017 with diastolic CHF exacerbation.  Last known EF prior to this admission was November 2015 and it was about 60 to 65 % at that time, repeat echo from 09/06/2017 suggest EF is down to 25 to 30%.    Plan:- 1)HFrEF/H/o CAD--- patient appears to have possibly acute on chronic systolic dysfunction CHF, Last known EF prior to this admission was November 2015 and it was about 60 to 65 % at that time, repeat echo from 09/06/2017 suggest EF is down to 25 to 30%.   shortness of breath is better, lower extremity edema improved,  continue Lasix 40 mg IV every 12 hours, daily weights, fluid input and output monitoring, monitor electrolytes, patient is on Coreg 3.125 mg twice daily, will not titrate beta-blockers upward at this time given CHF exacerbation, reluctant to use TED stockings due to peripheral artery disease   2)PAD-status post left femoral-popliteal bypass and iliofemoral endarterectomy , continue aspirin, Lipitor Isabel mg, as well as Plavix 75 mg daily  3)DM-uncontrolled, A1c was 10.9 in  February 2019, give Levemir insulin 10 units nightly,, Use Novolog/Humalog  Sliding scale insulin with Accu-Cheks/Fingersticks as ordered  4)Rt Big Toe Dry Gangrene/Foot infection-patient was on Augmentin prior to admission, currently on Rocephin  5) chronic anemia-hemoglobin currently < 8, discussed with patient, given CAD/PAD and symptomatic anemia will transfuse 1 unit of packed cells with Lasix . Risk, benefits and alternatives to transfusion of blood products discussed. Indication for transfusion discussed. Consent obtained   Code Status : full code  Disposition Plan  : home   Consults  :  n/a  DVT Prophylaxis  :  - Heparin -  Lab Results  Component Value Date   PLT 284 09/06/2017    Inpatient Medications  Scheduled Meds: . aspirin EC  81 mg Oral Daily  . atorvastatin  Isabel mg Oral q1800  . carvedilol  3.125 mg Oral BID  . clopidogrel  75 mg Oral Q breakfast  . furosemide  40 mg Intravenous BID  . furosemide  40 mg Intravenous Once  . heparin  5,000 Units Subcutaneous Q8H  . insulin aspart  0-9 Units Subcutaneous TID WC  . insulin detemir  10 Units Subcutaneous QHS  . pantoprazole  40 mg Oral Daily  . polyethylene glycol  17 g Oral Daily  . sodium chloride flush  3 mL Intravenous Q12H   Continuous Infusions: . sodium chloride    . sodium chloride    .  cefTRIAXone (ROCEPHIN)  IV Stopped (09/06/17 1143)   PRN Meds:.sodium chloride, acetaminophen **OR** acetaminophen, albuterol, ALPRAZolam, diphenhydrAMINE, ondansetron **OR** ondansetron (ZOFRAN) IV, oxyCODONE, sodium chloride flush    Anti-infectives (From admission, onward)   Start     Dose/Rate Route Frequency Ordered Stop   09/04/17 1445  amoxicillin-clavulanate (AUGMENTIN) 500-125 MG per tablet 500 mg  Status:  Discontinued     1 tablet Oral 2 times daily 09/04/17 1431 09/04/17 1455   09/04/17 1130  cefTRIAXone (ROCEPHIN) 1 g in sodium chloride 0.9 % 100 mL IVPB     1 g 200 mL/hr over 30 Minutes Intravenous Every 24 hours 09/04/17 1123          Objective:   Vitals:   09/05/17  2354 09/06/17 0420 09/06/17 1241 09/06/17 1542  BP: 120/61 115/60 132/78 124/68  Pulse: 87 82 84 84  Resp: 16 16 16 16   Temp: 97.8 F (36.6 C) 98.5 F (36.9 C) 98.6 F (37 C) 98.2 F (36.8 C)  TempSrc: Oral Oral Oral Oral  SpO2: 97% 93% 97% 98%  Weight:  79.2 kg (174 lb 9.7 oz)    Height:        Wt Readings from Last 3 Encounters:  09/06/17 79.2 kg (174 lb 9.7 oz)  08/30/17 75.1 kg (165 lb 8 oz)  05/22/17 70.8 kg (156 lb)     Intake/Output Summary (Last 24 hours) at 09/06/2017 1715 Last data filed at 09/06/2017 1019 Gross per 24 hour  Intake 720 ml  Output 1100 ml  Net -380 ml   Physical Exam  Gen:- Awake Alert,  In no apparent distress  HEENT:- .AT, No sclera icterus Neck-Supple Neck,No JVD,.  Lungs-faint bibasilar rales, good air movement CV- S1, S2 normal, CABG scar Abd-  +ve B.Sounds, Abd Soft, No tenderness,    Extremity/Skin:- + 1 pitting edema,   , right big toe with dry gangrene Psych-affect is appropriate, oriented x3 Neuro-no new focal deficits, no tremors   Data Review:   Micro Results No results found for this or any previous visit (from the past 240 hour(s)).  Radiology Reports Dg Chest 2 View  Result Date: 09/04/2017 CLINICAL DATA:  Shortness of breath, history of CHF and diabetes EXAM: CHEST - 2 VIEW COMPARISON:  Chest x-ray of 07/01/2013 FINDINGS: Aeration of the left lung base has improved with only mild basilar atelectasis remaining. There is cardiomegaly present and there may be mild pulmonary vascular congestion. Median sternotomy sutures remain from prior CABG. IMPRESSION: 1. Decrease in opacity at the left lung base. Mild atelectasis remains. 2. Cardiomegaly and probable mild pulmonary vascular congestion. Electronically Signed   By: Dwyane Dee M.D.   On: 09/04/2017 09:33   US Renal  Result Date: 08/30/2017 CLINICAL DATA:  Acute onset of renal failure. EXAM: RENAL / URINARY TRACT ULTRASOUND COMPLETE COMPARISON:  Abdominal ultrasound  performed 12/09/2005 FINDINGS: Right Kidney: Length: 14.8 cm. Slightly increased parenchymal echogenicity is noted. No mass or hydronephrosis visualized. Left Kidney: Length: 13.7 cm. Slightly increased parenchymal echogenicity is noted. No mass or hydronephrosis visualized. Bladder: Appears normal for degree of bladder distention. IMPRESSION: No evidence of hydronephrosis. Slightly increased renal parenchymal echogenicity raises concern for medical renal disease. Electronically Signed   By: Roanna Raider M.D.   On: 08/30/2017 21:53   US Arterial Abi (screening Lower Extremity)  Result Date: 08/25/2017 CLINICAL DATA:  Right great toe ulceration. Right foot pain. Previous left toe amputations. Previous left iliofemoral endarterectomy and fem-pop bypass graft. Diabetes. EXAM: NONINVASIVE PHYSIOLOGIC VASCULAR  STUDY OF BILATERAL LOWER EXTREMITIES TECHNIQUE: Evaluation of both lower extremities were performed at rest, including calculation of ankle-brachial indices with single level Doppler, pressure and pulse volume recording. COMPARISON:  07/20/2015 FINDINGS: Right ABI:  0.42 (previously 0.87) Left ABI:  0.95 (previously 0.42) Right Lower Extremity:  Monophasic distal arterial waveforms. Left Lower Extremity:  Biphasic distal arterial waveforms. IMPRESSION: 1. Severe progressive arterial occlusive disease in the right lower extremity. 2. Interval improvement in left lower extremity perfusion to near normal at rest. Electronically Signed   By: Corlis Leak M.D.   On: 08/25/2017 13:56   Dg Toe Great Right  Result Date: 08/24/2017 CLINICAL DATA:  Cellulitis of the right great toe. EXAM: RIGHT GREAT TOE COMPARISON:  None. FINDINGS: There is no evidence of fracture or dislocation. There is mild osteopenia with slight cortical irregularity of the distal tuft of the right first distal phalanx. There is soft tissue swelling of the distal right great toe. IMPRESSION: Mild osteopenia with slight cortical irregularity of  the distal tuft of the right first distal phalanx, suspicious for underlying osteomyelitis. Electronically Signed   By: Sherian Rein M.D.   On: 08/24/2017 13:14     CBC Recent Labs  Lab 08/31/17 0342 09/01/17 0440 09/04/17 0846 09/05/17 0632 09/06/17 1228  WBC 6.9 6.4 10.0 7.0 7.8  HGB 7.7* 7.8* 8.6* 7.7* 7.9*  HCT 23.8* 24.3* 26.3* 24.2* 24.8*  PLT 164 175 276 262 284  MCV 91.2 91.4 90.1 90.6 93.2  MCH 29.5 29.3 29.5 28.8 29.7  MCHC 32.4 32.1 32.7 31.8 31.9  RDW 12.9 13.0 13.0 13.1 13.2    Chemistries  Recent Labs  Lab 08/31/17 0342 09/01/17 0440 09/04/17 0846 09/05/17 0632 09/06/17 1228  NA 135 135 137 140 138  K 3.8 4.3 3.9 3.6 3.9  CL 106 107 108 106 104  CO2 20* 20* 21* 25 25  GLUCOSE 78 165* 266* 152* 265*  BUN 31* 28* 22* 23* 25*  CREATININE 2.54* 2.06* 1.31* 1.28* 1.32*  CALCIUM 7.6* 7.6* 8.8* 8.6* 8.3*  AST  --   --   --  12*  --   ALT  --   --   --  18  --   ALKPHOS  --   --   --  216*  --   BILITOT  --   --   --  0.4  --    ------------------------------------------------------------------------------------------------------------------ No results for input(s): CHOL, HDL, LDLCALC, TRIG, CHOLHDL, LDLDIRECT in the last 72 hours.  Lab Results  Component Value Date   HGBA1C 10.9 (H) 05/22/2017   ------------------------------------------------------------------------------------------------------------------ No results for input(s): TSH, T4TOTAL, T3FREE, THYROIDAB in the last 72 hours.  Invalid input(s): FREET3 ------------------------------------------------------------------------------------------------------------------ No results for input(s): VITAMINB12, FOLATE, FERRITIN, TIBC, IRON, RETICCTPCT in the last 72 hours.  Coagulation profile No results for input(s): INR, PROTIME in the last 168 hours.  No results for input(s): DDIMER in the last 72 hours.  Cardiac Enzymes No results for input(s): CKMB, TROPONINI, MYOGLOBIN in the last 168  hours.  Invalid input(s): CK ------------------------------------------------------------------------------------------------------------------    Component Value Date/Time   BNP 1,943.8 (H) 09/04/2017 1594     Shon Hale M.D on 09/06/2017 at 5:15 PM  Between 7am to 7pm - Pager - [phone]  After 7pm go to [email or web address] - password [email or web address]  [facility] -  Office  [phone]   Voice Recognition Reubin Milan dictation system was used to create this note, attempts have been made to correct errors. Please contact the author with questions  and/or clarifications.

## 2017-09-06 NOTE — Progress Notes (Signed)
Blood bank called at 6.15pm for blood being ready and blood administration started at 6.44pm, blood consent is in file, pre vitals recorded, pt is aware of possible side effects, will continue to monitor  Lonia Farber, RN

## 2017-09-06 NOTE — Progress Notes (Signed)
  Echocardiogram 2D Echocardiogram has been performed.  Isabel [name] 09/06/2017, 9:34 AM

## 2017-09-06 NOTE — Progress Notes (Signed)
MD called RN at 5.15pm that pt might need a unit of blood transfusion. Waiting for a Type and Screen from lab and blood bank to prepare blood, pt is informed  Lonia Farber, RN

## 2017-09-07 LAB — TYPE AND SCREEN
ABO/RH(D): A NEG
ANTIBODY SCREEN: NEGATIVE
Unit division: 0

## 2017-09-07 LAB — BASIC METABOLIC PANEL
Anion gap: 8 (ref 5–15)
BUN: 26 mg/dL — ABNORMAL HIGH (ref 6–20)
CHLORIDE: 101 mmol/L (ref 101–111)
CO2: 27 mmol/L (ref 22–32)
CREATININE: 1.53 mg/dL — AB (ref 0.44–1.00)
Calcium: 8.3 mg/dL — ABNORMAL LOW (ref 8.9–10.3)
GFR calc non Af Amer: 38 mL/min — ABNORMAL LOW (ref >60)
GFR, EST AFRICAN AMERICAN: 44 mL/min — AB (ref >60)
Glucose, Bld: 225 mg/dL — ABNORMAL HIGH (ref 65–99)
Potassium: 3.6 mmol/L (ref 3.5–5.1)
Sodium: 136 mmol/L (ref 135–145)

## 2017-09-07 LAB — BPAM RBC
Blood Product Expiration Date: 201906032359
ISSUE DATE / TIME: 201905251840
Unit Type and Rh: 600

## 2017-09-07 LAB — CBC
HEMATOCRIT: 27.2 % — AB (ref 36.0–46.0)
Hemoglobin: 8.8 g/dL — ABNORMAL LOW (ref 12.0–15.0)
MCH: 28.9 pg (ref 26.0–34.0)
MCHC: 32.4 g/dL (ref 30.0–36.0)
MCV: 89.5 fL (ref 78.0–100.0)
PLATELETS: 280 10*3/uL (ref 150–400)
RBC: 3.04 MIL/uL — AB (ref 3.87–5.11)
RDW: 14.1 % (ref 11.5–15.5)
WBC: 8.6 10*3/uL (ref 4.0–10.5)

## 2017-09-07 LAB — GLUCOSE, CAPILLARY: GLUCOSE-CAPILLARY: 230 mg/dL — AB (ref 65–99)

## 2017-09-07 MED ORDER — CARVEDILOL 6.25 MG PO TABS: 6.2500 mg | ORAL_TABLET | Freq: Two times a day (BID) | ORAL | 2 refills | Status: AC

## 2017-09-07 MED ORDER — FUROSEMIDE 40 MG PO TABS: 40.0000 mg | ORAL_TABLET | Freq: Every day | ORAL | 3 refills | Status: AC

## 2017-09-07 MED ORDER — INSULIN DETEMIR 100 UNIT/ML FLEXPEN: PEN_INJECTOR | SUBCUTANEOUS | 2 refills | Status: AC

## 2017-09-07 NOTE — Progress Notes (Signed)
Pt got discharged to home, discharge instructions provided and patient showed understanding to it, IV taken out,Telemonitor DC,pt left unit in wheelchair with all of the belongings accompanied with a family member (Husband) Jaquasha Carnevale,RN  

## 2017-09-07 NOTE — Discharge Instructions (Signed)
1)Please make Appointment with cardiologist in 1 week for follow-up on heart failure and primary care doctor for recheck of BMP/kidney test in 3 to 5 days 2) very low-salt diet advised 3) keep your feet elevated as much as possible 4) follow-up with your vascular surgeon as previously advised 5) check your weight daily, call if you gain more than 3 pounds in 1 day or more than 5 pounds in 1 week 6)Avoid ibuprofen/Advil/Aleve/Motrin/Goody Powders/Naproxen/BC powders/Meloxicam/Diclofenac/Indomethacin and other Nonsteroidal anti-inflammatory medications as these will make you more likely to bleed and can cause stomach ulcers, can also cause Kidney problems.

## 2017-09-07 NOTE — Discharge Summary (Signed)
Isabel [name], is a 52 y.o. female  DOB 1965-09-06  MRN [id number].  Admission date:  09/04/2017  Admitting Physician  Mauricio Annett Gula, MD  Discharge Date:  09/07/2017   Primary MD  [name], MD  Recommendations for primary care physician for things to follow:   1)Please make Appointment with cardiologist in 1 week for follow-up on heart failure and primary care doctor for recheck of BMP/kidney test in 3 to 5 days 2) very low-salt diet advised 3) keep your feet elevated as much as possible 4) follow-up with your vascular surgeon as previously advised 5) check your weight daily, call if you gain more than 3 pounds in 1 day or more than 5 pounds in 1 week 6)Avoid ibuprofen/Advil/Aleve/Motrin/Goody Powders/Naproxen/BC powders/Meloxicam/Diclofenac/Indomethacin and other Nonsteroidal anti-inflammatory medications as these will make you more likely to bleed and can cause stomach ulcers, can also cause Kidney problems.   Admission Diagnosis  Elevated troponin [R74.8] Acute on chronic congestive heart failure, unspecified heart failure type (HCC) [I50.9]   Discharge Diagnosis  Elevated troponin [R74.8] Acute on chronic congestive heart failure, unspecified heart failure type (HCC) [I50.9]    Active Problems:   Heart failure [facility])      Past Medical History:  Diagnosis Date  . Anxiety   . CHF (congestive heart failure) (HCC)   . Coronary artery disease    a. s/p CABG in 2015 with LIMA-LAD, seq reverse SVG-OM-dLCx, and reverse SVG-dRCA  . Diabetes mellitus without complication (HCC)    Type 2  . GERD (gastroesophageal reflux disease)   . History of kidney stones   . Peripheral vascular disease [facility])     Past Surgical History:  Procedure Laterality Date  . ABDOMINAL AORTOGRAM W/LOWER EXTREMITY N/A 08/28/2017   Procedure: ABDOMINAL AORTOGRAM W/LOWER EXTREMITY;  Surgeon: Maeola Harman, MD;  Location: [facility] INVASIVE CV LAB;  Service: Cardiovascular;  Laterality: N/A;  . AMPUTATION Left 08/18/2015   Procedure: Left Foot 4th and 5th Ray Amputation With [facility] Placement;  Surgeon: Nadara Mustard, MD;  Location: [facility] OR;  Service: Orthopedics;  Laterality: Left;  . AMPUTATION TOE Left 08/03/2015   Procedure: AMPUTATION LEFT FIFTH TOE;  Surgeon: Fransisco Hertz, MD;  Location: [facility] OR;  Service: Vascular;  Laterality: Left;  . BACK SURGERY    . CORONARY ANGIOPLASTY WITH STENT PLACEMENT    . CORONARY ARTERY BYPASS GRAFT N/A 06/08/2013   Procedure: CORONARY ARTERY BYPASS GRAFTING (CABG) times four using left internal mammary and right saphenous vein.;  Surgeon: Delight Ovens, MD;  Location: [facility] OR;  Service: Open Heart Surgery;  Laterality: N/A;  . ENDARTERECTOMY FEMORAL Left 08/03/2015   Procedure: ENDARTERECTOMY LEFT ILIO-FEMORAL PROFUNDA;  Surgeon: Fransisco Hertz, MD;  Location: [facility] OR;  Service: Vascular;  Laterality: Left;  . FEMORAL-POPLITEAL BYPASS GRAFT Left 08/03/2015   Procedure: BYPASS GRAFT  LEFT FEMORAL TO  BELOW KNEE POPLITEAL ARTERY USING X 80CM GORE PROPATEN GRAFT;  Surgeon: Fransisco Hertz, MD;  Location: [facility] OR;  Service: Vascular;  Laterality: Left;  . INTRAOPERATIVE TRANSESOPHAGEAL ECHOCARDIOGRAM N/A 06/08/2013   Procedure: INTRAOPERATIVE TRANSESOPHAGEAL ECHOCARDIOGRAM;  Surgeon: Delight Ovens, MD;  Location: [facility] OR;  Service: Open Heart Surgery;  Laterality: N/A;  . KIDNEY STONE SURGERY    . LEFT AND RIGHT HEART CATHETERIZATION WITH CORONARY ANGIOGRAM N/A 06/07/2013   Procedure: LEFT AND RIGHT HEART CATHETERIZATION WITH CORONARY ANGIOGRAM;  Surgeon: Marykay Lex, MD;  Location: [facility] CATH LAB;  Service: Cardiovascular;  Laterality: N/A;  . LOWER EXTREMITY ANGIOGRAM Bilateral 07/27/2015   Procedure: Lower Extremity Angiogram;  Surgeon: Chuck Hint, MD;  Location: [facility] INVASIVE CV LAB;  Service: Cardiovascular;  Laterality: Bilateral;  . PATCH ANGIOPLASTY Left  08/03/2015   Procedure: PATCH ANGIOPLASTY RIGHT ILIO-FEMORAL PROFUNDA USING XENOSURE BIOLOGIC PATCH;  Surgeon: Fransisco Hertz, MD;  Location: [facility] OR;  Service: Vascular;  Laterality: Left;  . PERIPHERAL VASCULAR CATHETERIZATION N/A 07/27/2015   Procedure: Abdominal Aortogram;  Surgeon: Chuck Hint, MD;  Location: [facility] INVASIVE CV LAB;  Service: Cardiovascular;  Laterality: N/A;  . PERIPHERAL VASCULAR INTERVENTION Right 08/28/2017   Procedure: PERIPHERAL VASCULAR INTERVENTION;  Surgeon: Maeola Harman, MD;  Location: [facility] INVASIVE CV LAB;  Service: Cardiovascular;  Laterality: Right;  . SALPINGOOPHORECTOMY     "? side"  . TONSILLECTOMY    . TUBAL LIGATION         HPI  from the history and physical done on the day of admission:   Patient coming from: Home   Chief Complaint: Lower extremity edema and dyspnea.   HPI: Isabel [name] is a 52 y.o. female with medical history significant of type 2 diabetes mellitus, hypertension and peripheral vascular disease, status post left femoral-popliteal bypass and iliofemoral endarterectomy.  Recent hospitalization May 12 to May 20 4 right great toe osteomyelitis, she underwent stenting to right SFA, popliteal artery and balloon angioplasty of the posterior tibial artery.  She was discharged on Augmentin to complete 4 weeks of therapy.  Since her discharge, patient has been experiencing worsening bilateral lower extremity edema, persistent and severe, with no improving or worsening factors, associated with PND, orthopnea, exertional dyspnea, wheezing, dry cough and increased abdominal girth.  Her dyspnea has been progressive, currently symptomatic with minimal efforts.  Denies any angina.   ED Course: Patient was found volume overloaded, received 40 mg of intravenous furosemide and she was referred for admission for evaluation    Hospital Course:    Brief summary 52 y.o.femalewith medical history significantoftype 2 diabetes  mellitus, hypertension and peripheral vascular disease, status post left femoral-popliteal bypass and iliofemoral endarterectomy admitted on 09/04/2017 with diastolic CHF exacerbation.  Last known EF prior to this admission was November 2015 and it was about 60 to 65 % at that time, repeat echo from 09/06/2017 suggest EF is down to 25 to 30%. Curbside consult cardiologist Dr. Sharrell Ku prior to d/c home today, advised outpatient follow-up with cardiology for further evaluation.  Renal function is borderline, patient will be reevaluated as outpatient, may need left heart catheterization as outpatient.  no ACE/ARB due to kidney concerns   Plan:- 1)HFrEF/H/o CAD--- patient appears to have possibly acute on chronic systolic dysfunction CHF, Last known EF prior to this admission was November 2015 and it was about 60 to 65 % at that time, repeat echo from 09/06/2017 suggest EF is down to 25 to 30%.  Curbside consult cardiologist Dr. Sharrell Ku prior to d/c home today, advised outpatient follow-up with cardiology for further evaluation.  Renal function is borderline, patient  will be reevaluated as outpatient, may need left heart catheterization as outpatient.  no ACE/ARB due to kidney concerns, improved with IV diuresis, discharged home on Coreg and oral diuretics, reluctant to use TED stockings due to peripheral artery disease.   patient will follow-up with cardiologist as outpatient for further evaluation of her CHF and worsening systolic function   2)PAD-status post left femoral-popliteal bypass and iliofemoral endarterectomy , continue aspirin, Lipitor 80 mg, as well as Plavix 75 mg daily  3)DM-uncontrolled, A1c was 10.9 in  February 2019, discharged on Levemir insulin 15 units nightly,,   4)Rt Big Toe Dry Gangrene/Foot infection-patient was on Augmentin prior to admission, may restart Augmentin as previously prescribed upon discharge  5) chronic anemia-hemoglobin is up to 8.8 after transfusion of 1  unit of packed cells on 09/06/2017 ,  given CAD/PAD and symptomatic anemia goal would be to keep hemoglobin above 8  6)CKD III--previously patient's creatinine was around 1.3, during recent admission she developed AKI on CKD with creatinine peaking at 2.54, during this admission creatinine has been between 1.3 and 1.5, continue to avoid nephrotoxic agents, no ACE/ARB/NSAIDs at this time  Code Status : full code  Disposition Plan  : home   Discharge Condition: stable  Follow UP-cardiology/heart failure clinic, PCP for recheck of BMP and vascular surgeon follow-up  Diet and Activity recommendation:  As advised  Discharge Instructions    Discharge Instructions    (HEART FAILURE PATIENTS) Call MD:  Anytime you have any of the following symptoms: 1) 3 pound weight gain in 24 hours or 5 pounds in 1 week 2) shortness of breath, with or without a dry hacking cough 3) swelling in the hands, feet or stomach 4) if you have to sleep on extra pillows at night in order to breathe.   Complete by:  As directed    AMB referral to CHF clinic   Complete by:  As directed    Please make Appointment with cardiologist in 1 week for follow-up on heart failure and primary care doctor for recheck of BMP/kidney test in 3 to 5 days   Call MD for:  difficulty breathing, headache or visual disturbances   Complete by:  As directed    Call MD for:  persistant dizziness or light-headedness   Complete by:  As directed    Call MD for:  persistant nausea and vomiting   Complete by:  As directed    Call MD for:  severe uncontrolled pain   Complete by:  As directed    Call MD for:  temperature >100.4   Complete by:  As directed    Diet - low sodium heart healthy   Complete by:  As directed    Diet Carb Modified   Complete by:  As directed    Discharge instructions   Complete by:  As directed    1)Please make Appointment with cardiologist in 1 week for follow-up on heart failure and primary care doctor for recheck of  BMP/kidney test in 3 to 5 days 2) very low-salt diet advised 3) keep your feet elevated as much as possible 4) follow-up with your vascular surgeon as previously advised 5) check your weight daily, call if you gain more than 3 pounds in 1 day or more than 5 pounds in 1 week 6)Avoid ibuprofen/Advil/Aleve/Motrin/Goody Powders/Naproxen/BC powders/Meloxicam/Diclofenac/Indomethacin and other Nonsteroidal anti-inflammatory medications as these will make you more likely to bleed and can cause stomach ulcers, can also cause Kidney problems.   Increase activity slowly  Complete by:  As directed         Discharge Medications     Allergies as of 09/07/2017      Reactions   No Known Allergies       Medication List    TAKE these medications   acetaminophen 325 MG tablet Commonly known as:  TYLENOL Take 650 mg by mouth every 6 (six) hours as needed for mild pain or headache.   ALPRAZolam 1 MG tablet Commonly known as:  XANAX Take 1 mg by mouth 4 (four) times daily as needed for anxiety.   amoxicillin-clavulanate 500-125 MG tablet Commonly known as:  AUGMENTIN Take 1 tablet (500 mg total) by mouth 2 (two) times daily for 28 days.   aspirin EC 81 MG tablet Take 81 mg by mouth daily.   atorvastatin 80 MG tablet Commonly known as:  LIPITOR Take 1 tablet (80 mg total) by mouth daily at 6 PM.   carvedilol 6.25 MG tablet Commonly known as:  COREG Take 1 tablet (6.25 mg total) by mouth 2 (two) times daily. What changed:    medication strength  how much to take   clopidogrel 75 MG tablet Commonly known as:  PLAVIX Take 1 tablet (75 mg total) by mouth daily with breakfast.   diphenhydrAMINE 25 mg capsule Commonly known as:  BENADRYL Take 25 mg by mouth daily as needed for allergies.   furosemide 40 MG tablet Commonly known as:  LASIX Take 1 tablet (40 mg total) by mouth daily.   HUMALOG KWIKPEN 100 UNIT/ML KiwkPen Generic drug:  insulin lispro Inject 2-20 Units into the skin  3 (three) times daily before meals. Per sliding scale. For high sugar   Insulin Detemir 100 UNIT/ML Pen Commonly known as:  LEVEMIR Take 15 units at once daily nighttime for now. Check your glucose levels and increase by 2 units if morning levels stay above 200. And then talk to your PCP. What changed:    how much to take  how to take this  when to take this  additional instructions   omeprazole 20 MG capsule Commonly known as:  PRILOSEC Take 1 capsule by mouth 2 (two) times daily.   oxyCODONE 5 MG immediate release tablet Commonly known as:  Oxy IR/ROXICODONE Take 1-2 tablets (5-10 mg total) by mouth every 4 (four) hours as needed for moderate pain.   polyethylene glycol packet Commonly known as:  MIRALAX / GLYCOLAX Take 17 g by mouth daily.   PROAIR HFA 108 (90 Base) MCG/ACT inhaler Generic drug:  albuterol Inhale 2 puffs into the lungs 4 (four) times daily as needed for shortness of breath.       Major procedures and Radiology Reports - PLEASE review detailed and final reports for all details, in brief -    Dg Chest 2 View  Result Date: 09/04/2017 CLINICAL DATA:  Shortness of breath, history of CHF and diabetes EXAM: CHEST - 2 VIEW COMPARISON:  Chest x-ray of 07/01/2013 FINDINGS: Aeration of the left lung base has improved with only mild basilar atelectasis remaining. There is cardiomegaly present and there may be mild pulmonary vascular congestion. Median sternotomy sutures remain from prior CABG. IMPRESSION: 1. Decrease in opacity at the left lung base. Mild atelectasis remains. 2. Cardiomegaly and probable mild pulmonary vascular congestion. Electronically Signed   By: Dwyane Dee M.D.   On: 09/04/2017 09:33   US Renal  Result Date: 08/30/2017 CLINICAL DATA:  Acute onset of renal failure. EXAM: RENAL / URINARY TRACT ULTRASOUND  COMPLETE COMPARISON:  Abdominal ultrasound performed 12/09/2005 FINDINGS: Right Kidney: Length: 14.8 cm. Slightly increased parenchymal  echogenicity is noted. No mass or hydronephrosis visualized. Left Kidney: Length: 13.7 cm. Slightly increased parenchymal echogenicity is noted. No mass or hydronephrosis visualized. Bladder: Appears normal for degree of bladder distention. IMPRESSION: No evidence of hydronephrosis. Slightly increased renal parenchymal echogenicity raises concern for medical renal disease. Electronically Signed   By: Roanna Raider M.D.   On: 08/30/2017 21:53   US Arterial Abi (screening Lower Extremity)  Result Date: 08/25/2017 CLINICAL DATA:  Right great toe ulceration. Right foot pain. Previous left toe amputations. Previous left iliofemoral endarterectomy and fem-pop bypass graft. Diabetes. EXAM: NONINVASIVE PHYSIOLOGIC VASCULAR STUDY OF BILATERAL LOWER EXTREMITIES TECHNIQUE: Evaluation of both lower extremities were performed at rest, including calculation of ankle-brachial indices with single level Doppler, pressure and pulse volume recording. COMPARISON:  07/20/2015 FINDINGS: Right ABI:  0.42 (previously 0.87) Left ABI:  0.95 (previously 0.42) Right Lower Extremity:  Monophasic distal arterial waveforms. Left Lower Extremity:  Biphasic distal arterial waveforms. IMPRESSION: 1. Severe progressive arterial occlusive disease in the right lower extremity. 2. Interval improvement in left lower extremity perfusion to near normal at rest. Electronically Signed   By: Corlis Leak M.D.   On: 08/25/2017 13:56   Dg Toe Great Right  Result Date: 08/24/2017 CLINICAL DATA:  Cellulitis of the right great toe. EXAM: RIGHT GREAT TOE COMPARISON:  None. FINDINGS: There is no evidence of fracture or dislocation. There is mild osteopenia with slight cortical irregularity of the distal tuft of the right first distal phalanx. There is soft tissue swelling of the distal right great toe. IMPRESSION: Mild osteopenia with slight cortical irregularity of the distal tuft of the right first distal phalanx, suspicious for underlying osteomyelitis.  Electronically Signed   By: Sherian Rein M.D.   On: 08/24/2017 13:14    Micro Results    No results found for this or any previous visit (from the past 240 hour(s)).     Today   Subjective    Isabel [name] today has no complaints, husband at bedside, questions answered, eager to go home          Patient has been seen and examined prior to discharge   Objective   Blood pressure 131/61, pulse 82, temperature 98.5 F (36.9 C), temperature source Oral, resp. rate 19, height 5\' 1"  (1.549 m), weight 76.5 kg (168 lb 10.4 oz), SpO2 93 %.   Intake/Output Summary (Last 24 hours) at 09/07/2017 0923 Last data filed at 09/07/2017 0533 Gross per 24 hour  Intake 1055 ml  Output 2500 ml  Net -1445 ml    Exam  Gen:- Awake Alert,  In no apparent distress  HEENT:- [place].AT, No sclera icterus Neck-Supple Neck,No JVD,.  Lungs-mostly clear bilaterally, good air movement CV- S1, S2 normal, CABG scar Abd-  +ve B.Sounds, Abd Soft, No tenderness,    Extremity/Skin:-Much improved pitting edema,   , right big toe with dry gangrene Psych-affect is appropriate, oriented x3 Neuro-no new focal deficits, no tremors     Data Review   CBC w Diff:  Lab Results  Component Value Date   WBC 8.6 09/07/2017   HGB 8.8 (L) 09/07/2017   HCT 27.2 (L) 09/07/2017   HCT 30.7 (L) 07/27/2015   PLT 280 09/07/2017   LYMPHOPCT 14 08/11/2015   MONOPCT 5 08/11/2015   EOSPCT 5 08/11/2015   BASOPCT 1 08/11/2015    CMP:  Lab Results  Component Value Date  NA 136 09/07/2017   K 3.6 09/07/2017   CL 101 09/07/2017   CO2 27 09/07/2017   BUN 26 (H) 09/07/2017   CREATININE 1.53 (H) 09/07/2017   PROT 5.5 (L) 09/05/2017   ALBUMIN 2.0 (L) 09/05/2017   BILITOT 0.4 09/05/2017   ALKPHOS 216 (H) 09/05/2017   AST 12 (L) 09/05/2017   ALT 18 09/05/2017  .   Total Discharge time is about 33 minutes  Shon Hale M.D on 09/07/2017 at 9:23 AM  [facility]   Office  [phone]  Voice Recognition  Reubin Milan dictation system was used to create this note, attempts have been made to correct errors. Please contact the author with questions and/or clarifications.

## 2017-09-10 ENCOUNTER — Other Ambulatory Visit: Payer: Self-pay

## 2017-10-02 ENCOUNTER — Telehealth: Payer: Self-pay

## 2017-10-02 DIAGNOSIS — Z79899 Other long term (current) drug therapy: Secondary | ICD-10-CM

## 2017-10-02 MED ORDER — EZETIMIBE 10 MG PO TABS: 10.0000 mg | ORAL_TABLET | Freq: Every day | ORAL | 3 refills | Status: AC

## 2017-10-02 NOTE — Telephone Encounter (Signed)
-----   Message from Laqueta Linden, MD sent at 10/01/2017 11:30 AM EDT ----- LDL not at goal. Add Zetia 10 mg. May eventually need PCSK-9 inhibitors. Repeat lipids 3 months after starting Zetia.

## 2017-10-02 NOTE — Telephone Encounter (Signed)
Pt made aware of lab results. Voiced understanding. Sent medication to pharmacy

## 2017-10-06 ENCOUNTER — Encounter: Payer: Self-pay | Admitting: Physician Assistant

## 2017-10-06 ENCOUNTER — Ambulatory Visit (INDEPENDENT_AMBULATORY_CARE_PROVIDER_SITE_OTHER): Payer: Self-pay | Admitting: Physician Assistant

## 2017-10-06 ENCOUNTER — Encounter

## 2017-10-06 VITALS — BP 142/78 | HR 85 | Ht 61.0 in | Wt 154.0 lb

## 2017-10-06 DIAGNOSIS — I739 Peripheral vascular disease, unspecified: Secondary | ICD-10-CM

## 2017-10-06 DIAGNOSIS — E782 Mixed hyperlipidemia: Secondary | ICD-10-CM

## 2017-10-06 DIAGNOSIS — I420 Dilated cardiomyopathy: Secondary | ICD-10-CM | POA: Insufficient documentation

## 2017-10-06 DIAGNOSIS — E1165 Type 2 diabetes mellitus with hyperglycemia: Secondary | ICD-10-CM

## 2017-10-06 DIAGNOSIS — D649 Anemia, unspecified: Secondary | ICD-10-CM

## 2017-10-06 DIAGNOSIS — I2510 Atherosclerotic heart disease of native coronary artery without angina pectoris: Secondary | ICD-10-CM

## 2017-10-06 DIAGNOSIS — E785 Hyperlipidemia, unspecified: Secondary | ICD-10-CM | POA: Insufficient documentation

## 2017-10-06 DIAGNOSIS — I5042 Chronic combined systolic (congestive) and diastolic (congestive) heart failure: Secondary | ICD-10-CM

## 2017-10-06 DIAGNOSIS — N183 Chronic kidney disease, stage 3 unspecified: Secondary | ICD-10-CM | POA: Insufficient documentation

## 2017-10-06 NOTE — H&P (View-Only) (Signed)
Cardiology Office Note    Date:  10/06/2017   ID:  Isabel, [name] 12/02/1965, MRN [id number]  PCP:  [name], MD  Cardiologist: Prentice Docker, MD  Chief Complaint  Patient presents with  . Hospitalization Follow-up    History of Present Illness:  Isabel [name] is a 52 y.o. 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  Patient last saw Randall An, PA-C in our office 05/22/2017 after work insisted that she come in because of an abnormal EKG although it was unchanged from prior tracings.  She was asymptomatic and no changes were made. Patient was then hospitalized 08/2017 with right great toe osteomyelitis and underwent stenting to the right SFA, popliteal artery and balloon angioplasty to the posterior tibial artery.  She was readmitted 3 days later with worsening lower extremity edema and CHF.  2D echo showed new LV dysfunction EF 25 to 30%.  Curbside consult with Dr. Ladona Ridgel on day of discharge recommended outpatient evaluation and possible left heart catheterization.  Patient also had chronic anemia and hemoglobin came up to 8.8 after transfusion of 1 unit 09/06/2017.  Creatinine went from 1.3-2.54 but was down to 1.53baseline at discharge.  Patient comes in today for follow-up.  She denies any chest pain, palpitations, dyspnea, dyspnea on exertion, dizziness or presyncope.  She is not very active because of her vascular disease.  Her gangrene has cleared and she finished her antibiotic yesterday.  Discussed with Dr. Purvis Sheffield who recommends cardiac catheterization.  She says when she had her heart cath in 2015 they had trouble accessing through her radial artery and they had to go through the groin.  Now she is concerned about going through the groin because of her stents.  Recent labs done at the health  department LDL was elevated at 109 triglycerides 377 and hemoglobin A1c 9.5.  Dr. Purvis Sheffield added Zetia to her atorvastatin.   Past Medical History:  Diagnosis Date  . Anxiety   . CHF (congestive heart failure) (HCC)   . Coronary artery disease    a. s/p CABG in 2015 with LIMA-LAD, seq reverse SVG-OM-dLCx, and reverse SVG-dRCA  . Diabetes mellitus without complication (HCC)    Type 2  . GERD (gastroesophageal reflux disease)   . History of kidney stones   . Peripheral vascular disease [facility])     Past Surgical History:  Procedure Laterality Date  . ABDOMINAL AORTOGRAM W/LOWER EXTREMITY N/A 08/28/2017   Procedure: ABDOMINAL AORTOGRAM W/LOWER EXTREMITY;  Surgeon: Maeola Harman, MD;  Location: [facility] INVASIVE CV LAB;  Service: Cardiovascular;  Laterality: N/A;  . AMPUTATION Left 08/18/2015   Procedure: Left Foot 4th and 5th Ray Amputation With [facility] Placement;  Surgeon: Nadara Mustard, MD;  Location: [facility] OR;  Service: Orthopedics;  Laterality: Left;  . AMPUTATION TOE Left 08/03/2015   Procedure: AMPUTATION LEFT FIFTH TOE;  Surgeon: Fransisco Hertz, MD;  Location: [facility] OR;  Service: Vascular;  Laterality: Left;  . BACK SURGERY    . CORONARY ANGIOPLASTY WITH STENT PLACEMENT    . CORONARY ARTERY BYPASS GRAFT N/A 06/08/2013   Procedure: CORONARY ARTERY BYPASS GRAFTING (CABG) times four using left internal mammary and right saphenous vein.;  Surgeon: Delight Ovens, MD;  Location: [facility] OR;  Service: Open Heart Surgery;  Laterality: N/A;  . ENDARTERECTOMY FEMORAL  Left 08/03/2015   Procedure: ENDARTERECTOMY LEFT ILIO-FEMORAL PROFUNDA;  Surgeon: Fransisco Hertz, MD;  Location: [facility] OR;  Service: Vascular;  Laterality: Left;  . FEMORAL-POPLITEAL BYPASS GRAFT Left 08/03/2015   Procedure: BYPASS GRAFT  LEFT FEMORAL TO  BELOW KNEE POPLITEAL ARTERY USING X 80CM GORE PROPATEN GRAFT;  Surgeon: Fransisco Hertz, MD;  Location: [facility] OR;  Service: Vascular;  Laterality: Left;  . INTRAOPERATIVE TRANSESOPHAGEAL  ECHOCARDIOGRAM N/A 06/08/2013   Procedure: INTRAOPERATIVE TRANSESOPHAGEAL ECHOCARDIOGRAM;  Surgeon: Delight Ovens, MD;  Location: [facility] OR;  Service: Open Heart Surgery;  Laterality: N/A;  . KIDNEY STONE SURGERY    . LEFT AND RIGHT HEART CATHETERIZATION WITH CORONARY ANGIOGRAM N/A 06/07/2013   Procedure: LEFT AND RIGHT HEART CATHETERIZATION WITH CORONARY ANGIOGRAM;  Surgeon: Marykay Lex, MD;  Location: [facility] CATH LAB;  Service: Cardiovascular;  Laterality: N/A;  . LOWER EXTREMITY ANGIOGRAM Bilateral 07/27/2015   Procedure: Lower Extremity Angiogram;  Surgeon: Chuck Hint, MD;  Location: [facility] INVASIVE CV LAB;  Service: Cardiovascular;  Laterality: Bilateral;  . PATCH ANGIOPLASTY Left 08/03/2015   Procedure: PATCH ANGIOPLASTY RIGHT ILIO-FEMORAL PROFUNDA USING XENOSURE BIOLOGIC PATCH;  Surgeon: Fransisco Hertz, MD;  Location: [facility] OR;  Service: Vascular;  Laterality: Left;  . PERIPHERAL VASCULAR CATHETERIZATION N/A 07/27/2015   Procedure: Abdominal Aortogram;  Surgeon: Chuck Hint, MD;  Location: [facility] INVASIVE CV LAB;  Service: Cardiovascular;  Laterality: N/A;  . PERIPHERAL VASCULAR INTERVENTION Right 08/28/2017   Procedure: PERIPHERAL VASCULAR INTERVENTION;  Surgeon: Maeola Harman, MD;  Location: [facility] INVASIVE CV LAB;  Service: Cardiovascular;  Laterality: Right;  . SALPINGOOPHORECTOMY     "? side"  . TONSILLECTOMY    . TUBAL LIGATION      Current Medications: Current Meds  Medication Sig  . acetaminophen (TYLENOL) 325 MG tablet Take 650 mg by mouth every 6 (six) hours as needed for mild pain or headache.  . ALPRAZolam (XANAX) 1 MG tablet Take 1 mg by mouth 4 (four) times daily as needed for anxiety.   Marland Kitchen aspirin EC 81 MG tablet Take 81 mg by mouth daily.  Marland Kitchen atorvastatin (LIPITOR) 80 MG tablet Take 1 tablet (80 mg total) by mouth daily at 6 PM.  . carvedilol (COREG) 6.25 MG tablet Take 1 tablet (6.25 mg total) by mouth 2 (two) times daily.  . clopidogrel (PLAVIX) 75 MG  tablet Take 1 tablet (75 mg total) by mouth daily with breakfast.  . diphenhydrAMINE (BENADRYL) 25 mg capsule Take 25 mg by mouth daily as needed for allergies.  . furosemide (LASIX) 40 MG tablet Take 1 tablet (40 mg total) by mouth daily.  Marland Kitchen HUMALOG KWIKPEN 100 UNIT/ML KiwkPen Inject 2-20 Units into the skin 3 (three) times daily before meals. Per sliding scale. For high sugar  . Insulin Detemir (LEVEMIR) 100 UNIT/ML Pen Take 15 units at once daily nighttime for now. Check your glucose levels and increase by 2 units if morning levels stay above 200. And then talk to your PCP.  Marland Kitchen omeprazole (PRILOSEC) 20 MG capsule Take 1 capsule by mouth 2 (two) times daily.  . polyethylene glycol (MIRALAX / GLYCOLAX) packet Take 17 g by mouth daily.  Marland Kitchen PROAIR HFA 108 (90 BASE) MCG/ACT inhaler Inhale 2 puffs into the lungs 4 (four) times daily as needed for shortness of breath.   . [DISCONTINUED] oxyCODONE (OXY IR/ROXICODONE) 5 MG immediate release tablet Take 1-2 tablets (5-10 mg total) by mouth every 4 (four) hours as needed for moderate pain.  Allergies:   No known allergies   Social History   Socioeconomic History  . Marital status: Married    Spouse name: Not on file  . Number of children: Not on file  . Years of education: Not on file  . Highest education level: Not on file  Occupational History  . Not on file  Social Needs  . Financial resource strain: Very hard  . Food insecurity:    Worry: Patient refused    Inability: Patient refused  . Transportation needs:    Medical: Yes    Non-medical: Yes  Tobacco Use  . Smoking status: Former Smoker    Packs/day: 0.50    Years: 10.00    Pack years: 5.00    Types: Cigarettes    Start date: 12/26/1992    Last attempt to quit: 04/15/2000    Years since quitting: 17.4  . Smokeless tobacco: Never Used  . Tobacco comment: QUIT SMOKING YEARS AGO "  Substance and Sexual Activity  . Alcohol use: Yes    Alcohol/week: 0.0 oz    Comment: 07/27/2015  "might have a couple drinks twice/month"  . Drug use: No  . Sexual activity: Not on file  Lifestyle  . Physical activity:    Days per week: Not on file    Minutes per session: Not on file  . Stress: Not on file  Relationships  . Social connections:    Talks on phone: Not on file    Gets together: Not on file    Attends religious service: Not on file    Active member of club or organization: Not on file    Attends meetings of clubs or organizations: Not on file    Relationship status: Not on file  Other Topics Concern  . Not on file  Social History Narrative  . Not on file     Family History:  The patient's   family history includes Cancer in her father; Diabetes in her father, mother, paternal aunt, and paternal grandfather; Heart disease in her brother, father, and mother; Stroke in her maternal grandmother and mother.   ROS:   Please see the history of present illness.    Review of Systems  Constitution: Negative.  HENT: Negative.   Eyes: Negative.   Cardiovascular: Negative.   Respiratory: Negative.   Hematologic/Lymphatic: Negative.   Musculoskeletal: Positive for muscle weakness and stiffness. Negative for joint pain.  Gastrointestinal: Negative.   Genitourinary: Negative.   Neurological: Negative.    All other systems reviewed and are negative.   PHYSICAL EXAM:   VS:  BP (!) 142/78 (BP Location: Right Arm)   Pulse 85   Ht 5\' 1"  (1.549 m)   Wt 154 lb (69.9 kg)   SpO2 96%   BMI 29.10 kg/m   Physical Exam  GEN: Well nourished, well developed, in no acute distress  HEENT: normal  Neck: no JVD, carotid bruits, or masses Cardiac:RRR; 1/6 systolic murmur at the left sternal border Respiratory:  clear to auscultation bilaterally, normal work of breathing GI: soft, nontender, nondistended, + BS Ext: Trace of right ankle edema otherwise without cyanosis, clubbing, or edema, Good distal pulses bilaterally MS: no deformity or atrophy  Skin: warm and dry, no  rash Neuro:  Alert and Oriented x 3 Psych: euthymic mood, full affect  Wt Readings from Last 3 Encounters:  10/06/17 154 lb (69.9 kg)  09/07/17 168 lb 10.4 oz (76.5 kg)  08/30/17 165 lb 8 oz (75.1 kg)  Studies/Labs Reviewed:   EKG:  EKG is ordered today.  The EKG today normal sinus rhythm with poor R wave progression, no acute changed Ekg 09/05/2017 normal sinus rhythm with poor R wave progression unchanged from prior tracings Recent Labs: 09/04/2017: B Natriuretic Peptide 1,943.8 09/05/2017: ALT 18 09/07/2017: BUN 26; Creatinine, Ser 1.53; Hemoglobin 8.8; Platelets 280; Potassium 3.6; Sodium 136   Lipid Panel    Component Value Date/Time   CHOL 278 (H) 06/05/2013 0515   TRIG 295 (H) 06/05/2013 0515   HDL 27 (L) 06/05/2013 0515   CHOLHDL 10.3 06/05/2013 0515   VLDL 59 (H) 06/05/2013 0515   LDLCALC 192 (H) 06/05/2013 0515    Additional studies/ records that were reviewed today include:    2D echo 2/25/19Study Conclusions   - Left ventricle: The cavity size was normal. Systolic function was   severely reduced. The estimated ejection fraction was in the   range of 25% to 30%. Diffuse hypokinesis. Doppler parameters are   consistent with a reversible restrictive pattern, indicative of   decreased left ventricular diastolic compliance and/or increased   left atrial pressure (grade 3 diastolic dysfunction). - Mitral valve: Mildly calcified annulus. There was mild to   moderate regurgitation. - Left atrium: The atrium was moderately dilated. - Pulmonary arteries: Systolic pressure was mildly increased. PA   peak pressure: 39 mm Hg (S).   CAD status post CABGEchocardiogram: 02/2014 Study Conclusions  - Left ventricle: The cavity size was normal. Wall thickness was   normal. Systolic function was normal. The estimated ejection   fraction was in the range of 60% to 65%. Doppler parameters are   consistent with abnormal left ventricular relaxation (grade 1   diastolic  dysfunction). - Aortic valve: Mildly calcified annulus. Trileaflet; mildly   thickened leaflets. Valve area (VTI): 2.26 cm^2. Valve area   (Vmax): 2.14 cm^2. - Mitral valve: Mildly calcified annulus. Mildly thickened leaflets   . - Left atrium: The atrium was mildly dilated. - Right ventricle: The RV is poorly visualized, it appears grossly   normal in size and function. - Technically difficult study     ASSESSMENT:    1. Coronary artery disease involving native heart without angina pectoris, unspecified vessel or lesion type   2. Chronic combined systolic and diastolic heart failure (HCC)   3. PVD (peripheral vascular disease) (HCC)   4. Uncontrolled type 2 diabetes mellitus with hyperglycemia (HCC)   5. Dilated cardiomyopathy (HCC)   6. CKD (chronic kidney disease), stage III (HCC)   7. Anemia, unspecified type   8. Mixed hyperlipidemia      PLAN:  In order of problems listed above:  CAD status post CABG x 4 2015 now with new cardiomyopathy EF was 25 to 30% 05/2013 improved to 60 to 65% 02/2014 and now down again to 25 to 30%.  Dr. Purvis Sheffield recommends patient undergo cardiac catheterization to evaluate for ischemia.  Access may be difficult.  They had trouble going through her radial artery in 2015 and as she has stents in her femoral arteries. I have reviewed the risks, indications, and alternatives to angioplasty and stenting with the patient. Risks include but are not limited to bleeding, infection, vascular injury, stroke, myocardial infection, arrhythmia, kidney injury, radiation-related injury in the case of prolonged fluoroscopy use, emergency cardiac surgery, and death. The patient understands the risks of serious complication is low (<1%) and patient agrees to proceed.   Chronic combined systolic and diastolic CHF currently compensated  Cardiomyopathy EF now 25  to 30% down from 2015 at which time was normal with recent admission for CHF-patient's heart failure  compensated today.  PVD with recent right great toe osteomyelitis status post left femoropopliteal bypass and iliofemoral endarterectomy with right great toe osteomyelitis and stenting to the right SFA, popliteal artery and balloon angioplasty of the posterior tibial artery 08/2017   Uncontrolled diabetes mellitus hemoglobin A1c 9.5 earlier this month.  She is trying to get better control.  CKD stage III most recent creatinine was 1.53.  We will repeat today since she is on daily Lasix.  Chronic anemia requiring transfusion last hospitalization most recent hemoglobin 8.8.  Will repeat today.  Hyperlipidemia LDL not at goal at 109.  Dr. Purvis Sheffield just added Zetia 10 mg daily to her atorvastatin 80 mg daily.  Will need repeat labs in 6 weeks.  Medication Adjustments/Labs and Tests Ordered: Current medicines are reviewed at length with the patient today.  Concerns regarding medicines are outlined above.  Medication changes, Labs and Tests ordered today are listed in the Patient Instructions below. Patient Instructions  Medication Instructions:  Your physician recommends that you continue on your current medications as directed. Please refer to the Current Medication list given to you today.  Labwork: ASAP  Testing/Procedures: Your physician has requested that you have a cardiac catheterization. Cardiac catheterization is used to diagnose and/or treat various heart conditions. Doctors may recommend this procedure for a number of different reasons. The most common reason is to evaluate chest pain. Chest pain can be a symptom of coronary artery disease (CAD), and cardiac catheterization can show whether plaque is narrowing or blocking your heart's arteries. This procedure is also used to evaluate the valves, as well as measure the blood flow and oxygen levels in different parts of your heart. For further information please visit [email or web address] Please follow instruction sheet, as  given.    Follow-Up: Your physician recommends that you schedule a follow-up appointment in: 1 MONTH POST CATH    Any Other Special Instructions Will Be Listed Below (If Applicable).     If you need a refill on your cardiac medications before your next appointment, please call your pharmacy.   [facility] CARDIOVASCULAR DIVISION [facility] [place] [address] [place] [address] Dept: [phone] Loc: [phone]  DHAMAR HYNDS  10/06/2017  You are scheduled for a Cardiac Catheterization on Thursday, June 27 with Dr. Verdis Prime.  1. Please arrive at the [facility] (Main Entrance A) at [facility]: [address] [place], [place] [address] at 5:30 AM (two hours before your procedure to ensure your preparation). Free valet parking service is available.   Special note: Every effort is made to have your procedure done on time. Please understand that emergencies sometimes delay scheduled procedures.  2. Diet: Do not eat or drink anything after midnight prior to your procedure except sips of water to take medications.  3. Labs: ASAP  4. Medication instructions in preparation for your procedure:   Current Outpatient Medications (Endocrine & Metabolic):  Marland Kitchen  HUMALOG KWIKPEN 100 UNIT/ML KiwkPen, Inject 2-20 Units into the skin 3 (three) times daily before meals. Per sliding scale. For high sugar .  Insulin Detemir (LEVEMIR) 100 UNIT/ML Pen, Take 15 units at once daily nighttime for now. Check your glucose levels and increase by 2 units if morning levels stay above 200. And then talk to your PCP.  Current Outpatient Medications (Cardiovascular):  .  atorvastatin (LIPITOR) 80 MG tablet, Take  1 tablet (80 mg total) by mouth daily at 6 PM. .  carvedilol (COREG) 6.25 MG tablet, Take 1 tablet (6.25 mg total) by mouth 2 (two) times daily. .  furosemide (LASIX) 40 MG tablet, Take 1 tablet (40 mg total) by mouth daily. Marland Kitchen  ezetimibe (ZETIA) 10 MG  tablet, Take 1 tablet (10 mg total) by mouth daily. (Patient not taking: Reported on 10/06/2017)  Current Outpatient Medications (Respiratory):  .  diphenhydrAMINE (BENADRYL) 25 mg capsule, Take 25 mg by mouth daily as needed for allergies. Marland Kitchen  PROAIR HFA 108 (90 BASE) MCG/ACT inhaler, Inhale 2 puffs into the lungs 4 (four) times daily as needed for shortness of breath.   Current Outpatient Medications (Analgesics):  .  acetaminophen (TYLENOL) 325 MG tablet, Take 650 mg by mouth every 6 (six) hours as needed for mild pain or headache. Marland Kitchen  aspirin EC 81 MG tablet, Take 81 mg by mouth daily.  Current Outpatient Medications (Hematological):  .  clopidogrel (PLAVIX) 75 MG tablet, Take 1 tablet (75 mg total) by mouth daily with breakfast.  Current Outpatient Medications (Other):  Marland Kitchen  ALPRAZolam (XANAX) 1 MG tablet, Take 1 mg by mouth 4 (four) times daily as needed for anxiety.  Marland Kitchen  omeprazole (PRILOSEC) 20 MG capsule, Take 1 capsule by mouth 2 (two) times daily. .  polyethylene glycol (MIRALAX / GLYCOLAX) packet, Take 17 g by mouth daily. *For reference purposes while preparing patient instructions.   Delete this med list prior to printing instructions for patient.*      Take only 26 units of insulin the night before your procedure. Do not take any insulin on the day of the procedure.  On the morning of your procedure, take your Aspirin and any morning medicines NOT listed above.  You may use sips of water.  5. Plan for one night stay--bring personal belongings. 6. Bring a current list of your medications and current insurance cards. 7. You MUST have a responsible person to drive you home. 8. Someone MUST be with you the first 24 hours after you arrive home or your discharge will be delayed. 9. Please wear clothes that are easy to get on and off and wear slip-on shoes.  Thank you for allowing [place] to care for you!   -- [facility] Invasive Cardiovascular services       Signed, Jacolyn Reedy, PA-C  10/06/2017 1:58 PM    [facility] [address] [place], [place], [place]  [address] Phone: [phone]; Fax: [phone]

## 2017-10-06 NOTE — Patient Instructions (Signed)
Medication Instructions:  Your physician recommends that you continue on your current medications as directed. Please refer to the Current Medication list given to you today.  Labwork: ASAP  Testing/Procedures: Your physician has requested that you have a cardiac catheterization. Cardiac catheterization is used to diagnose and/or treat various heart conditions. Doctors may recommend this procedure for a number of different reasons. The most common reason is to evaluate chest pain. Chest pain can be a symptom of coronary artery disease (CAD), and cardiac catheterization can show whether plaque is narrowing or blocking your heart's arteries. This procedure is also used to evaluate the valves, as well as measure the blood flow and oxygen levels in different parts of your heart. For further information please visit [email or web address] Please follow instruction sheet, as given.    Follow-Up: Your physician recommends that you schedule a follow-up appointment in: 1 MONTH POST CATH    Any Other Special Instructions Will Be Listed Below (If Applicable).     If you need a refill on your cardiac medications before your next appointment, please call your pharmacy.   [facility] CARDIOVASCULAR DIVISION [facility] [place] [address] [place] [address] Dept: [phone] Loc: [phone]  Isabel [name]  10/06/2017  You are scheduled for a Cardiac Catheterization on Thursday, June 27 with Dr. Verdis Prime.  1. Please arrive at the [facility] (Main Entrance A) at [facility]: [address] [place], [place] [address] at 5:30 AM (two hours before your procedure to ensure your preparation). Free valet parking service is available.   Special note: Every effort is made to have your procedure done on time. Please understand that emergencies sometimes delay scheduled procedures.  2. Diet: Do not eat or drink anything after midnight prior to your procedure  except sips of water to take medications.  3. Labs: ASAP  4. Medication instructions in preparation for your procedure:   Current Outpatient Medications (Endocrine & Metabolic):  Marland Kitchen  HUMALOG KWIKPEN 100 UNIT/ML KiwkPen, Inject 2-20 Units into the skin 3 (three) times daily before meals. Per sliding scale. For high sugar .  Insulin Detemir (LEVEMIR) 100 UNIT/ML Pen, Take 15 units at once daily nighttime for now. Check your glucose levels and increase by 2 units if morning levels stay above 200. And then talk to your PCP.  Current Outpatient Medications (Cardiovascular):  .  atorvastatin (LIPITOR) 80 MG tablet, Take 1 tablet (80 mg total) by mouth daily at 6 PM. .  carvedilol (COREG) 6.25 MG tablet, Take 1 tablet (6.25 mg total) by mouth 2 (two) times daily. .  furosemide (LASIX) 40 MG tablet, Take 1 tablet (40 mg total) by mouth daily. Marland Kitchen  ezetimibe (ZETIA) 10 MG tablet, Take 1 tablet (10 mg total) by mouth daily. (Patient not taking: Reported on 10/06/2017)  Current Outpatient Medications (Respiratory):  .  diphenhydrAMINE (BENADRYL) 25 mg capsule, Take 25 mg by mouth daily as needed for allergies. Marland Kitchen  PROAIR HFA 108 (90 BASE) MCG/ACT inhaler, Inhale 2 puffs into the lungs 4 (four) times daily as needed for shortness of breath.   Current Outpatient Medications (Analgesics):  .  acetaminophen (TYLENOL) 325 MG tablet, Take 650 mg by mouth every 6 (six) hours as needed for mild pain or headache. Marland Kitchen  aspirin EC 81 MG tablet, Take 81 mg by mouth daily.  Current Outpatient Medications (Hematological):  .  clopidogrel (PLAVIX) 75 MG tablet, Take 1 tablet (75 mg total) by mouth daily with breakfast.  Current Outpatient Medications (Other):  Marland Kitchen  ALPRAZolam (XANAX) 1 MG tablet, Take 1 mg by mouth 4 (four) times daily as needed for anxiety.  Marland Kitchen  omeprazole (PRILOSEC) 20 MG capsule, Take 1 capsule by mouth 2 (two) times daily. .  polyethylene glycol (MIRALAX / GLYCOLAX) packet, Take 17 g by mouth  daily. *For reference purposes while preparing patient instructions.   Delete this med list prior to printing instructions for patient.*      Take only 26 units of insulin the night before your procedure. Do not take any insulin on the day of the procedure.  On the morning of your procedure, take your Aspirin and any morning medicines NOT listed above.  You may use sips of water.  5. Plan for one night stay--bring personal belongings. 6. Bring a current list of your medications and current insurance cards. 7. You MUST have a responsible person to drive you home. 8. Someone MUST be with you the first 24 hours after you arrive home or your discharge will be delayed. 9. Please wear clothes that are easy to get on and off and wear slip-on shoes.  Thank you for allowing [place] to care for you!   -- [facility]

## 2017-10-06 NOTE — Progress Notes (Addendum)
 Cardiology Office Note    Date:  10/06/2017   ID:  Isabel [name], DOB 03/09/1966, MRN [id number]  PCP:  Howard, Kevin, MD  Cardiologist: Suresh Koneswaran, MD  Chief Complaint  Patient presents with  . Hospitalization Follow-up    History of Present Illness:  Isabel [name] is a 51 y.o. 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  Patient last saw Brittany Strader, PA-C in our office 05/22/2017 after work insisted that she come in because of an abnormal EKG although it was unchanged from prior tracings.  She was asymptomatic and no changes were made. Patient was then hospitalized 08/2017 with right great toe osteomyelitis and underwent stenting to the right SFA, popliteal artery and balloon angioplasty to the posterior tibial artery.  She was readmitted 3 days later with worsening lower extremity edema and CHF.  2D echo showed new LV dysfunction EF 25 to 30%.  Curbside consult with Dr. Taylor on day of discharge recommended outpatient evaluation and possible left heart catheterization.  Patient also had chronic anemia and hemoglobin came up to 8.8 after transfusion of 1 unit 09/06/2017.  Creatinine went from 1.3-2.54 but was down to 1.53baseline at discharge.  Patient comes in today for follow-up.  She denies any chest pain, palpitations, dyspnea, dyspnea on exertion, dizziness or presyncope.  She is not very active because of her vascular disease.  Her gangrene has cleared and she finished her antibiotic yesterday.  Discussed with Dr. Koneswaran who recommends cardiac catheterization.  She says when she had her heart cath in 2015 they had trouble accessing through her radial artery and they had to go through the groin.  Now she is concerned about going through the groin because of her stents.  Recent labs done at the health  department LDL was elevated at 109 triglycerides 377 and hemoglobin A1c 9.5.  Dr. Koneswaran added Zetia to her atorvastatin.   Past Medical History:  Diagnosis Date  . Anxiety   . CHF (congestive heart failure) (HCC)   . Coronary artery disease    a. s/p CABG in 2015 with LIMA-LAD, seq reverse SVG-OM-dLCx, and reverse SVG-dRCA  . Diabetes mellitus without complication (HCC)    Type 2  . GERD (gastroesophageal reflux disease)   . History of kidney stones   . Peripheral vascular disease (HCC)     Past Surgical History:  Procedure Laterality Date  . ABDOMINAL AORTOGRAM W/LOWER EXTREMITY N/A 08/28/2017   Procedure: ABDOMINAL AORTOGRAM W/LOWER EXTREMITY;  Surgeon: Cain, Brandon Christopher, MD;  [facility];  Service: Cardiovascular;  Laterality: N/A;  . AMPUTATION Left 08/18/2015   Procedure: Left Foot 4th and 5th Ray Amputation With VAC Placement;  Surgeon: Marcus Duda V, MD;  Location: [facility];  Service: Orthopedics;  Laterality: Left;  . AMPUTATION TOE Left 08/03/2015   Procedure: AMPUTATION LEFT FIFTH TOE;  Surgeon: Brian L Chen, MD;  Location: [facility];  Service: Vascular;  Laterality: Left;  . BACK SURGERY    . CORONARY ANGIOPLASTY WITH STENT PLACEMENT    . CORONARY ARTERY BYPASS GRAFT N/A 06/08/2013   Procedure: CORONARY ARTERY BYPASS GRAFTING (CABG) times four using left internal mammary and right saphenous vein.;  Surgeon: Edward B Gerhardt, MD;  Location: [facility];  Service: Open Heart Surgery;  Laterality: N/A;  . ENDARTERECTOMY FEMORAL   Left 08/03/2015   Procedure: ENDARTERECTOMY LEFT ILIO-FEMORAL PROFUNDA;  Surgeon: Brian L Chen, MD;  Location: [facility];  Service: Vascular;  Laterality: Left;  . FEMORAL-POPLITEAL BYPASS GRAFT Left 08/03/2015   Procedure: BYPASS GRAFT  LEFT FEMORAL TO  BELOW KNEE POPLITEAL ARTERY USING 6MM X 80CM GORE PROPATEN GRAFT;  Surgeon: Brian L Chen, MD;  [facility];  Service: Vascular;  Laterality: Left;  . INTRAOPERATIVE TRANSESOPHAGEAL  ECHOCARDIOGRAM N/A 06/08/2013   Procedure: INTRAOPERATIVE TRANSESOPHAGEAL ECHOCARDIOGRAM;  Surgeon: Edward B Gerhardt, MD;  [facility]: [facility];  Service: Open Heart Surgery;  Laterality: N/A;  . KIDNEY STONE SURGERY    . LEFT AND RIGHT HEART CATHETERIZATION WITH CORONARY ANGIOGRAM N/A 06/07/2013   Procedure: LEFT AND RIGHT HEART CATHETERIZATION WITH CORONARY ANGIOGRAM;  Surgeon: David W Harding, MD;  [facility];  Service: Cardiovascular;  Laterality: N/A;  . LOWER EXTREMITY ANGIOGRAM Bilateral 07/27/2015   Procedure: Lower Extremity Angiogram;  Surgeon: Christopher S Dickson, MD;  [facility];  Service: Cardiovascular;  Laterality: Bilateral;  . PATCH ANGIOPLASTY Left 08/03/2015   Procedure: PATCH ANGIOPLASTY RIGHT ILIO-FEMORAL PROFUNDA USING XENOSURE BIOLOGIC PATCH;  Surgeon: Brian L Chen, MD;  [facility];  Service: Vascular;  Laterality: Left;  . PERIPHERAL VASCULAR CATHETERIZATION N/A 07/27/2015   Procedure: Abdominal Aortogram;  Surgeon: Christopher S Dickson, MD;  [facility];  Service: Cardiovascular;  Laterality: N/A;  . PERIPHERAL VASCULAR INTERVENTION Right 08/28/2017   Procedure: PERIPHERAL VASCULAR INTERVENTION;  Surgeon: Cain, Brandon Christopher, MD;  [facility];  Service: Cardiovascular;  Laterality: Right;  . SALPINGOOPHORECTOMY     "? side"  . TONSILLECTOMY    . TUBAL LIGATION      Current Medications: Current Meds  Medication Sig  . acetaminophen (TYLENOL) 325 MG tablet Take 650 mg by mouth every 6 (six) hours as needed for mild pain or headache.  . ALPRAZolam (XANAX) 1 MG tablet Take 1 mg by mouth 4 (four) times daily as needed for anxiety.   . aspirin EC 81 MG tablet Take 81 mg by mouth daily.  . atorvastatin (LIPITOR) 80 MG tablet Take 1 tablet (80 mg total) by mouth daily at 6 PM.  . carvedilol (COREG) 6.25 MG tablet Take 1 tablet (6.25 mg total) by mouth 2 (two) times daily.  . clopidogrel (PLAVIX) 75 MG  tablet Take 1 tablet (75 mg total) by mouth daily with breakfast.  . diphenhydrAMINE (BENADRYL) 25 mg capsule Take 25 mg by mouth daily as needed for allergies.  . furosemide (LASIX) 40 MG tablet Take 1 tablet (40 mg total) by mouth daily.  . HUMALOG KWIKPEN 100 UNIT/ML KiwkPen Inject 2-20 Units into the skin 3 (three) times daily before meals. Per sliding scale. For high sugar  . Insulin Detemir (LEVEMIR) 100 UNIT/ML Pen Take 15 units at once daily nighttime for now. Check your glucose levels and increase by 2 units if morning levels stay above 200. And then talk to your PCP.  . omeprazole (PRILOSEC) 20 MG capsule Take 1 capsule by mouth 2 (two) times daily.  . polyethylene glycol (MIRALAX / GLYCOLAX) packet Take 17 g by mouth daily.  . PROAIR HFA 108 (90 BASE) MCG/ACT inhaler Inhale 2 puffs into the lungs 4 (four) times daily as needed for shortness of breath.   . [DISCONTINUED] oxyCODONE (OXY IR/ROXICODONE) 5 MG immediate release tablet Take 1-2 tablets (5-10 mg total) by mouth every 4 (four) hours as needed for moderate pain.       Allergies:   No known allergies   Social History   Socioeconomic History  . Marital status: Married    Spouse name: Not on file  . Number of children: Not on file  . Years of education: Not on file  . Highest education level: Not on file  Occupational History  . Not on file  Social Needs  . Financial resource strain: Very hard  . Food insecurity:    Worry: Patient refused    Inability: Patient refused  . Transportation needs:    Medical: Yes    Non-medical: Yes  Tobacco Use  . Smoking status: Former Smoker    Packs/day: 0.50    Years: 10.00    Pack years: 5.00    Types: Cigarettes    Start date: 12/26/1992    Last attempt to quit: 04/15/2000    Years since quitting: 17.4  . Smokeless tobacco: Never Used  . Tobacco comment: QUIT SMOKING YEARS AGO "  Substance and Sexual Activity  . Alcohol use: Yes    Alcohol/week: 0.0 oz    Comment: 07/27/2015  "might have a couple drinks twice/month"  . Drug use: No  . Sexual activity: Not on file  Lifestyle  . Physical activity:    Days per week: Not on file    Minutes per session: Not on file  . Stress: Not on file  Relationships  . Social connections:    Talks on phone: Not on file    Gets together: Not on file    Attends religious service: Not on file    Active member of club or organization: Not on file    Attends meetings of clubs or organizations: Not on file    Relationship status: Not on file  Other Topics Concern  . Not on file  Social History Narrative  . Not on file     Family History:  The patient's   family history includes Cancer in her father; Diabetes in her father, mother, paternal aunt, and paternal grandfather; Heart disease in her brother, father, and mother; Stroke in her maternal grandmother and mother.   ROS:   Please see the history of present illness.    Review of Systems  Constitution: Negative.  HENT: Negative.   Eyes: Negative.   Cardiovascular: Negative.   Respiratory: Negative.   Hematologic/Lymphatic: Negative.   Musculoskeletal: Positive for muscle weakness and stiffness. Negative for joint pain.  Gastrointestinal: Negative.   Genitourinary: Negative.   Neurological: Negative.    All other systems reviewed and are negative.   PHYSICAL EXAM:   VS:  BP (!) 142/78 (BP Location: Right Arm)   Pulse 85   Ht 5' 1" (1.549 m)   Wt 154 lb (69.9 kg)   SpO2 96%   BMI 29.10 kg/m   Physical Exam  GEN: Well nourished, well developed, in no acute distress  HEENT: normal  Neck: no JVD, carotid bruits, or masses Cardiac:RRR; 1/6 systolic murmur at the left sternal border Respiratory:  clear to auscultation bilaterally, normal work of breathing GI: soft, nontender, nondistended, + BS Ext: Trace of right ankle edema otherwise without cyanosis, clubbing, or edema, Good distal pulses bilaterally MS: no deformity or atrophy  Skin: warm and dry, no  rash Neuro:  Alert and Oriented x 3 Psych: euthymic mood, full affect  Wt Readings from Last 3 Encounters:  10/06/17 154 lb (69.9 kg)  09/07/17 168 lb 10.4 oz (76.5 kg)  08/30/17 165 lb 8 oz (75.1 kg)        Studies/Labs Reviewed:   EKG:  EKG is ordered today.  The EKG today normal sinus rhythm with poor R wave progression, no acute changed Ekg 09/05/2017 normal sinus rhythm with poor R wave progression unchanged from prior tracings Recent Labs: 09/04/2017: B Natriuretic Peptide 1,943.8 09/05/2017: ALT 18 09/07/2017: BUN 26; Creatinine, Ser 1.53; Hemoglobin 8.8; Platelets 280; Potassium 3.6; Sodium 136   Lipid Panel    Component Value Date/Time   CHOL 278 (H) 06/05/2013 0515   TRIG 295 (H) 06/05/2013 0515   HDL 27 (L) 06/05/2013 0515   CHOLHDL 10.3 06/05/2013 0515   VLDL 59 (H) 06/05/2013 0515   LDLCALC 192 (H) 06/05/2013 0515    Additional studies/ records that were reviewed today include:    2D echo 2/25/19Study Conclusions   - Left ventricle: The cavity size was normal. Systolic function was   severely reduced. The estimated ejection fraction was in the   range of 25% to 30%. Diffuse hypokinesis. Doppler parameters are   consistent with a reversible restrictive pattern, indicative of   decreased left ventricular diastolic compliance and/or increased   left atrial pressure (grade 3 diastolic dysfunction). - Mitral valve: Mildly calcified annulus. There was mild to   moderate regurgitation. - Left atrium: The atrium was moderately dilated. - Pulmonary arteries: Systolic pressure was mildly increased. PA   peak pressure: 39 mm Hg (S).   CAD status post CABGEchocardiogram: 02/2014 Study Conclusions  - Left ventricle: The cavity size was normal. Wall thickness was   normal. Systolic function was normal. The estimated ejection   fraction was in the range of 60% to 65%. Doppler parameters are   consistent with abnormal left ventricular relaxation (grade 1   diastolic  dysfunction). - Aortic valve: Mildly calcified annulus. Trileaflet; mildly   thickened leaflets. Valve area (VTI): 2.26 cm^2. Valve area   (Vmax): 2.14 cm^2. - Mitral valve: Mildly calcified annulus. Mildly thickened leaflets   . - Left atrium: The atrium was mildly dilated. - Right ventricle: The RV is poorly visualized, it appears grossly   normal in size and function. - Technically difficult study     ASSESSMENT:    1. Coronary artery disease involving native heart without angina pectoris, unspecified vessel or lesion type   2. Chronic combined systolic and diastolic heart failure (HCC)   3. PVD (peripheral vascular disease) (HCC)   4. Uncontrolled type 2 diabetes mellitus with hyperglycemia (HCC)   5. Dilated cardiomyopathy (HCC)   6. CKD (chronic kidney disease), stage III (HCC)   7. Anemia, unspecified type   8. Mixed hyperlipidemia      PLAN:  In order of problems listed above:  CAD status post CABG x 4 2015 now with new cardiomyopathy EF was 25 to 30% 05/2013 improved to 60 to 65% 02/2014 and now down again to 25 to 30%.  Dr. Koneswaran recommends patient undergo cardiac catheterization to evaluate for ischemia.  Access may be difficult.  They had trouble going through her radial artery in 2015 and as she has stents in her femoral arteries. I have reviewed the risks, indications, and alternatives to angioplasty and stenting with the patient. Risks include but are not limited to bleeding, infection, vascular injury, stroke, myocardial infection, arrhythmia, kidney injury, radiation-related injury in the case of prolonged fluoroscopy use, emergency cardiac surgery, and death. The patient understands the risks of serious complication is low (<1%) and patient agrees to proceed.   Chronic combined systolic and diastolic CHF currently compensated  Cardiomyopathy EF now 25   to 30% down from 2015 at which time was normal with recent admission for CHF-patient's heart failure  compensated today.  PVD with recent right great toe osteomyelitis status post left femoropopliteal bypass and iliofemoral endarterectomy with right great toe osteomyelitis and stenting to the right SFA, popliteal artery and balloon angioplasty of the posterior tibial artery 08/2017   Uncontrolled diabetes mellitus hemoglobin A1c 9.5 earlier this month.  She is trying to get better control.  CKD stage III most recent creatinine was 1.53.  We will repeat today since she is on daily Lasix.  Chronic anemia requiring transfusion last hospitalization most recent hemoglobin 8.8.  Will repeat today.  Hyperlipidemia LDL not at goal at 109.  Dr. Koneswaran just added Zetia 10 mg daily to her atorvastatin 80 mg daily.  Will need repeat labs in 6 weeks.  Medication Adjustments/Labs and Tests Ordered: Current medicines are reviewed at length with the patient today.  Concerns regarding medicines are outlined above.  Medication changes, Labs and Tests ordered today are listed in the Patient Instructions below. Patient Instructions  Medication Instructions:  Your physician recommends that you continue on your current medications as directed. Please refer to the Current Medication list given to you today.  Labwork: ASAP  Testing/Procedures: Your physician has requested that you have a cardiac catheterization. Cardiac catheterization is used to diagnose and/or treat various heart conditions. Doctors may recommend this procedure for a number of different reasons. The most common reason is to evaluate chest pain. Chest pain can be a symptom of coronary artery disease (CAD), and cardiac catheterization can show whether plaque is narrowing or blocking your heart's arteries. This procedure is also used to evaluate the valves, as well as measure the blood flow and oxygen levels in different parts of your heart. For further information please visit www.[email or web address].org. Please follow instruction sheet, as  given.    Follow-Up: Your physician recommends that you schedule a follow-up appointment in: 1 MONTH POST CATH    Any Other Special Instructions Will Be Listed Below (If Applicable).     If you need a refill on your cardiac medications before your next appointment, please call your pharmacy.   [facility] [address] [place] [address] Dept: [phone] Loc: [phone]  Isabel [name]  10/06/2017  You are scheduled for a Cardiac Catheterization on Thursday, June 27 with Dr. Henry Smith.  1. Please arrive at the [facility] Tower (Main Entrance A) at [facility]: [address] [place], [place] [address] at 5:30 AM (two hours before your procedure to ensure your preparation). Free valet parking service is available.   Special note: Every effort is made to have your procedure done on time. Please understand that emergencies sometimes delay scheduled procedures.  2. Diet: Do not eat or drink anything after midnight prior to your procedure except sips of water to take medications.  3. Labs: ASAP  4. Medication instructions in preparation for your procedure:   Current Outpatient Medications (Endocrine & Metabolic):  .  HUMALOG KWIKPEN 100 UNIT/ML KiwkPen, Inject 2-20 Units into the skin 3 (three) times daily before meals. Per sliding scale. For high sugar .  Insulin Detemir (LEVEMIR) 100 UNIT/ML Pen, Take 15 units at once daily nighttime for now. Check your glucose levels and increase by 2 units if morning levels stay above 200. And then talk to your PCP.  Current Outpatient Medications (Cardiovascular):  .  atorvastatin (LIPITOR) 80 MG tablet, Take   1 tablet (80 mg total) by mouth daily at 6 PM. .  carvedilol (COREG) 6.25 MG tablet, Take 1 tablet (6.25 mg total) by mouth 2 (two) times daily. .  furosemide (LASIX) 40 MG tablet, Take 1 tablet (40 mg total) by mouth daily. .  ezetimibe (ZETIA) 10 MG  tablet, Take 1 tablet (10 mg total) by mouth daily. (Patient not taking: Reported on 10/06/2017)  Current Outpatient Medications (Respiratory):  .  diphenhydrAMINE (BENADRYL) 25 mg capsule, Take 25 mg by mouth daily as needed for allergies. .  PROAIR HFA 108 (90 BASE) MCG/ACT inhaler, Inhale 2 puffs into the lungs 4 (four) times daily as needed for shortness of breath.   Current Outpatient Medications (Analgesics):  .  acetaminophen (TYLENOL) 325 MG tablet, Take 650 mg by mouth every 6 (six) hours as needed for mild pain or headache. .  aspirin EC 81 MG tablet, Take 81 mg by mouth daily.  Current Outpatient Medications (Hematological):  .  clopidogrel (PLAVIX) 75 MG tablet, Take 1 tablet (75 mg total) by mouth daily with breakfast.  Current Outpatient Medications (Other):  .  ALPRAZolam (XANAX) 1 MG tablet, Take 1 mg by mouth 4 (four) times daily as needed for anxiety.  .  omeprazole (PRILOSEC) 20 MG capsule, Take 1 capsule by mouth 2 (two) times daily. .  polyethylene glycol (MIRALAX / GLYCOLAX) packet, Take 17 g by mouth daily. *For reference purposes while preparing patient instructions.   Delete this med list prior to printing instructions for patient.*      Take only 26 units of insulin the night before your procedure. Do not take any insulin on the day of the procedure.  On the morning of your procedure, take your Aspirin and any morning medicines NOT listed above.  You may use sips of water.  5. Plan for one night stay--bring personal belongings. 6. Bring a current list of your medications and current insurance cards. 7. You MUST have a responsible person to drive you home. 8. Someone MUST be with you the first 24 hours after you arrive home or your discharge will be delayed. 9. Please wear clothes that are easy to get on and off and wear slip-on shoes.  Thank you for allowing us to care for you!   -- [facility]       Signed, Marquett Bertoli, PA-C  10/06/2017 1:58 PM    [facility] [address], [place], [place]  [address] Phone: [phone]; Fax: [phone]    

## 2017-10-07 ENCOUNTER — Telehealth: Payer: Self-pay | Admitting: *Deleted

## 2017-10-07 ENCOUNTER — Telehealth: Payer: Self-pay

## 2017-10-07 NOTE — Telephone Encounter (Addendum)
Pt contacted pre-catheterization scheduled at [facility] for: Thursday October 09, 2017 7:30 AM Verify arrival time and place: [facility] Main Entrance A at: 5:30 AM  No solid food after midnight prior to cath, clear liquids until 5 AM day of procedure. Verify allergies in Epic  Hold: Furosemide AM of procedure Insulin AM of procedure 1/2 Insulin PM prior to procedure.   Except hold medications AM meds can be  taken pre-cath with sip of water including: ASA 81 mg Clopidogrel 75 mg  Confirmed patient has responsible person to drive home post procedure and observe patient for 24 hours:yes  Lab done at [facility] 10/06/17.

## 2017-10-08 NOTE — Telephone Encounter (Signed)
BMP results done 10/06/17 at [facility] [employer] have been faxed to Short Stay and Cath Lab,  no CBC done per [place], [place] [phone].

## 2017-10-09 ENCOUNTER — Encounter (HOSPITAL_COMMUNITY): Payer: Self-pay | Admitting: Interventional Cardiology

## 2017-10-09 ENCOUNTER — Ambulatory Visit (HOSPITAL_COMMUNITY)
Admission: RE | Disposition: A | Discharge status: Home / Self Care | Payer: Self-pay | Source: Ambulatory Visit | Attending: Interventional Cardiology

## 2017-10-09 ENCOUNTER — Ambulatory Visit (HOSPITAL_COMMUNITY)
Admission: RE | Admit: 2017-10-09 | Discharge: 2017-10-09 | Disposition: A | Discharge status: Home / Self Care | Payer: Medicaid Other | Source: Ambulatory Visit | Attending: Interventional Cardiology | Admitting: Interventional Cardiology

## 2017-10-09 DIAGNOSIS — E1165 Type 2 diabetes mellitus with hyperglycemia: Secondary | ICD-10-CM | POA: Insufficient documentation

## 2017-10-09 DIAGNOSIS — Z87891 Personal history of nicotine dependence: Secondary | ICD-10-CM | POA: Insufficient documentation

## 2017-10-09 DIAGNOSIS — I420 Dilated cardiomyopathy: Secondary | ICD-10-CM | POA: Insufficient documentation

## 2017-10-09 DIAGNOSIS — Z794 Long term (current) use of insulin: Secondary | ICD-10-CM | POA: Insufficient documentation

## 2017-10-09 DIAGNOSIS — N183 Chronic kidney disease, stage 3 unspecified: Secondary | ICD-10-CM | POA: Diagnosis present

## 2017-10-09 DIAGNOSIS — Z7901 Long term (current) use of anticoagulants: Secondary | ICD-10-CM | POA: Insufficient documentation

## 2017-10-09 DIAGNOSIS — D649 Anemia, unspecified: Secondary | ICD-10-CM | POA: Insufficient documentation

## 2017-10-09 DIAGNOSIS — I130 Hypertensive heart and chronic kidney disease with heart failure and stage 1 through stage 4 chronic kidney disease, or unspecified chronic kidney disease: Secondary | ICD-10-CM | POA: Insufficient documentation

## 2017-10-09 DIAGNOSIS — Z9889 Other specified postprocedural states: Secondary | ICD-10-CM | POA: Insufficient documentation

## 2017-10-09 DIAGNOSIS — I739 Peripheral vascular disease, unspecified: Secondary | ICD-10-CM | POA: Diagnosis present

## 2017-10-09 DIAGNOSIS — Z955 Presence of coronary angioplasty implant and graft: Secondary | ICD-10-CM | POA: Insufficient documentation

## 2017-10-09 DIAGNOSIS — E1169 Type 2 diabetes mellitus with other specified complication: Secondary | ICD-10-CM | POA: Insufficient documentation

## 2017-10-09 DIAGNOSIS — Z833 Family history of diabetes mellitus: Secondary | ICD-10-CM | POA: Insufficient documentation

## 2017-10-09 DIAGNOSIS — Z89422 Acquired absence of other left toe(s): Secondary | ICD-10-CM | POA: Insufficient documentation

## 2017-10-09 DIAGNOSIS — I2510 Atherosclerotic heart disease of native coronary artery without angina pectoris: Secondary | ICD-10-CM | POA: Diagnosis present

## 2017-10-09 DIAGNOSIS — I5022 Chronic systolic (congestive) heart failure: Secondary | ICD-10-CM

## 2017-10-09 DIAGNOSIS — I70269 Atherosclerosis of native arteries of extremities with gangrene, unspecified extremity: Secondary | ICD-10-CM | POA: Diagnosis present

## 2017-10-09 DIAGNOSIS — IMO0002 Reserved for concepts with insufficient information to code with codable children: Secondary | ICD-10-CM | POA: Diagnosis present

## 2017-10-09 DIAGNOSIS — Z79899 Other long term (current) drug therapy: Secondary | ICD-10-CM | POA: Insufficient documentation

## 2017-10-09 DIAGNOSIS — Z951 Presence of aortocoronary bypass graft: Secondary | ICD-10-CM

## 2017-10-09 DIAGNOSIS — I2582 Chronic total occlusion of coronary artery: Secondary | ICD-10-CM | POA: Insufficient documentation

## 2017-10-09 DIAGNOSIS — Z7982 Long term (current) use of aspirin: Secondary | ICD-10-CM | POA: Insufficient documentation

## 2017-10-09 DIAGNOSIS — I5042 Chronic combined systolic (congestive) and diastolic (congestive) heart failure: Secondary | ICD-10-CM | POA: Insufficient documentation

## 2017-10-09 DIAGNOSIS — I350 Nonrheumatic aortic (valve) stenosis: Secondary | ICD-10-CM

## 2017-10-09 DIAGNOSIS — Z9851 Tubal ligation status: Secondary | ICD-10-CM | POA: Insufficient documentation

## 2017-10-09 DIAGNOSIS — Z87442 Personal history of urinary calculi: Secondary | ICD-10-CM | POA: Insufficient documentation

## 2017-10-09 DIAGNOSIS — E1151 Type 2 diabetes mellitus with diabetic peripheral angiopathy without gangrene: Secondary | ICD-10-CM | POA: Insufficient documentation

## 2017-10-09 DIAGNOSIS — K219 Gastro-esophageal reflux disease without esophagitis: Secondary | ICD-10-CM | POA: Insufficient documentation

## 2017-10-09 DIAGNOSIS — I25810 Atherosclerosis of coronary artery bypass graft(s) without angina pectoris: Secondary | ICD-10-CM | POA: Insufficient documentation

## 2017-10-09 DIAGNOSIS — Z8249 Family history of ischemic heart disease and other diseases of the circulatory system: Secondary | ICD-10-CM | POA: Insufficient documentation

## 2017-10-09 DIAGNOSIS — E782 Mixed hyperlipidemia: Secondary | ICD-10-CM | POA: Insufficient documentation

## 2017-10-09 DIAGNOSIS — E1122 Type 2 diabetes mellitus with diabetic chronic kidney disease: Secondary | ICD-10-CM | POA: Insufficient documentation

## 2017-10-09 HISTORY — PX: LEFT HEART CATH AND CORS/GRAFTS ANGIOGRAPHY: CATH118250

## 2017-10-09 HISTORY — PX: ULTRASOUND GUIDANCE FOR VASCULAR ACCESS: SHX6516

## 2017-10-09 LAB — BASIC METABOLIC PANEL
ANION GAP: 9 (ref 5–15)
BUN: 18 mg/dL (ref 6–20)
CALCIUM: 8.9 mg/dL (ref 8.9–10.3)
CO2: 26 mmol/L (ref 22–32)
Chloride: 100 mmol/L (ref 98–111)
Creatinine, Ser: 1.04 mg/dL — ABNORMAL HIGH (ref 0.44–1.00)
GFR calc non Af Amer: >60 mL/min (ref >60)
GLUCOSE: 447 mg/dL — AB (ref 70–99)
POTASSIUM: 3.2 mmol/L — AB (ref 3.5–5.1)
Sodium: 135 mmol/L (ref 135–145)

## 2017-10-09 LAB — CBC
HEMATOCRIT: 33.7 % — AB (ref 36.0–46.0)
HEMOGLOBIN: 11.4 g/dL — AB (ref 12.0–15.0)
MCH: 29.5 pg (ref 26.0–34.0)
MCHC: 33.8 g/dL (ref 30.0–36.0)
MCV: 87.1 fL (ref 78.0–100.0)
Platelets: 224 10*3/uL (ref 150–400)
RBC: 3.87 MIL/uL (ref 3.87–5.11)
RDW: 13.3 % (ref 11.5–15.5)
WBC: 5.8 10*3/uL (ref 4.0–10.5)

## 2017-10-09 LAB — GLUCOSE, CAPILLARY
GLUCOSE-CAPILLARY: 238 mg/dL — AB (ref 70–99)
Glucose-Capillary: 473 mg/dL — ABNORMAL HIGH (ref 70–99)
Glucose-Capillary: 248 mg/dL — ABNORMAL HIGH (ref 70–99)

## 2017-10-09 SURGERY — LEFT HEART CATH AND CORS/GRAFTS ANGIOGRAPHY
Anesthesia: LOCAL

## 2017-10-09 MED ORDER — POTASSIUM CHLORIDE CRYS ER 20 MEQ PO TBCR
EXTENDED_RELEASE_TABLET | ORAL | Status: AC
  Filled 2017-10-09: qty 2

## 2017-10-09 MED ORDER — FENTANYL CITRATE (PF) 100 MCG/2ML IJ SOLN
INTRAMUSCULAR | Status: AC
  Filled 2017-10-09: qty 2

## 2017-10-09 MED ORDER — HEPARIN SODIUM (PORCINE) 1000 UNIT/ML IJ SOLN
INTRAMUSCULAR | Status: DC | PRN
  Administered 2017-10-09: 4000 [IU] via INTRAVENOUS

## 2017-10-09 MED ORDER — SODIUM CHLORIDE 0.9 % IV SOLN
INTRAVENOUS | Status: DC
  Administered 2017-10-09: 06:00:00 via INTRAVENOUS

## 2017-10-09 MED ORDER — SODIUM CHLORIDE 0.9% FLUSH: 3.0000 mL | Freq: Two times a day (BID) | INTRAVENOUS | Status: DC

## 2017-10-09 MED ORDER — LIDOCAINE HCL (PF) 1 % IJ SOLN
INTRAMUSCULAR | Status: DC | PRN
  Administered 2017-10-09: 2 mL

## 2017-10-09 MED ORDER — VERAPAMIL HCL 2.5 MG/ML IV SOLN
INTRAVENOUS | Status: AC
  Filled 2017-10-09: qty 2

## 2017-10-09 MED ORDER — ATORVASTATIN CALCIUM 80 MG PO TABS: 80.0000 mg | ORAL_TABLET | Freq: Every day | ORAL | Status: DC

## 2017-10-09 MED ORDER — OXYCODONE HCL 5 MG PO TABS: 5.0000 mg | ORAL_TABLET | ORAL | Status: DC | PRN

## 2017-10-09 MED ORDER — POTASSIUM CHLORIDE CRYS ER 20 MEQ PO TBCR
40.0000 meq | EXTENDED_RELEASE_TABLET | Freq: Once | ORAL | Status: AC
  Administered 2017-10-09: 40 meq via ORAL
  Filled 2017-10-09: qty 2

## 2017-10-09 MED ORDER — IOPAMIDOL (ISOVUE-370) INJECTION 76%
INTRAVENOUS | Status: AC
  Filled 2017-10-09: qty 125

## 2017-10-09 MED ORDER — INSULIN ASPART 100 UNIT/ML ~~LOC~~ SOLN
10.0000 [IU] | Freq: Once | SUBCUTANEOUS | Status: AC
  Administered 2017-10-09: 10 [IU] via SUBCUTANEOUS
  Filled 2017-10-09: qty 0.1

## 2017-10-09 MED ORDER — MIDAZOLAM HCL 2 MG/2ML IJ SOLN
INTRAMUSCULAR | Status: DC | PRN
  Administered 2017-10-09 (×2): 1 mg via INTRAVENOUS

## 2017-10-09 MED ORDER — SODIUM CHLORIDE 0.9 % IV SOLN: 250.0000 mL | INTRAVENOUS | Status: DC | PRN

## 2017-10-09 MED ORDER — DIAZEPAM 5 MG PO TABS: 5.0000 mg | ORAL_TABLET | Freq: Three times a day (TID) | ORAL | Status: DC | PRN

## 2017-10-09 MED ORDER — SODIUM CHLORIDE 0.9% FLUSH: 3.0000 mL | INTRAVENOUS | Status: DC | PRN

## 2017-10-09 MED ORDER — SODIUM CHLORIDE 0.9 % IV SOLN: INTRAVENOUS | Status: DC

## 2017-10-09 MED ORDER — ONDANSETRON HCL 4 MG/2ML IJ SOLN: 4.0000 mg | Freq: Four times a day (QID) | INTRAMUSCULAR | Status: DC | PRN

## 2017-10-09 MED ORDER — IOPAMIDOL (ISOVUE-370) INJECTION 76%
INTRAVENOUS | Status: DC | PRN
  Administered 2017-10-09: 140 mL via INTRA_ARTERIAL

## 2017-10-09 MED ORDER — MIDAZOLAM HCL 2 MG/2ML IJ SOLN
INTRAMUSCULAR | Status: AC
  Filled 2017-10-09: qty 2

## 2017-10-09 MED ORDER — LIDOCAINE HCL (PF) 1 % IJ SOLN
INTRAMUSCULAR | Status: AC
  Filled 2017-10-09: qty 30

## 2017-10-09 MED ORDER — FENTANYL CITRATE (PF) 100 MCG/2ML IJ SOLN
INTRAMUSCULAR | Status: DC | PRN
  Administered 2017-10-09: 50 ug via INTRAVENOUS

## 2017-10-09 MED ORDER — HEPARIN (PORCINE) IN NACL 1000-0.9 UT/500ML-% IV SOLN
INTRAVENOUS | Status: AC
  Filled 2017-10-09: qty 1000

## 2017-10-09 MED ORDER — HEPARIN SODIUM (PORCINE) 1000 UNIT/ML IJ SOLN
INTRAMUSCULAR | Status: AC
  Filled 2017-10-09: qty 1

## 2017-10-09 MED ORDER — VERAPAMIL HCL 2.5 MG/ML IV SOLN
INTRAVENOUS | Status: DC | PRN
  Administered 2017-10-09: 10 mL via INTRA_ARTERIAL

## 2017-10-09 MED ORDER — HEPARIN (PORCINE) IN NACL 2-0.9 UNITS/ML
INTRAMUSCULAR | Status: AC | PRN
  Administered 2017-10-09 (×2): 500 mL

## 2017-10-09 MED ORDER — ACETAMINOPHEN 325 MG PO TABS: 650.0000 mg | ORAL_TABLET | ORAL | Status: DC | PRN

## 2017-10-09 MED ORDER — ASPIRIN 81 MG PO CHEW: 81.0000 mg | CHEWABLE_TABLET | ORAL | Status: DC

## 2017-10-09 MED ORDER — INSULIN ASPART 100 UNIT/ML ~~LOC~~ SOLN
SUBCUTANEOUS | Status: AC
  Filled 2017-10-09: qty 1

## 2017-10-09 MED ORDER — IOPAMIDOL (ISOVUE-370) INJECTION 76%
INTRAVENOUS | Status: AC
  Filled 2017-10-09: qty 50

## 2017-10-09 SURGICAL SUPPLY — 14 items
CATH INFINITI 5FR MULTPACK ANG (CATHETERS) ×1 IMPLANT
CATH LAUNCHER 5F JL3 (CATHETERS) IMPLANT
CATH LAUNCHER 6FR EBU 3 (CATHETERS) ×1 IMPLANT
CATHETER LAUNCHER 5F JL3 (CATHETERS) ×3
COVER PRB 48X5XTLSCP FOLD TPE (BAG) IMPLANT
COVER PROBE 5X48 (BAG) ×3
DEVICE RAD TR BAND REGULAR (VASCULAR PRODUCTS) ×1 IMPLANT
GLIDESHEATH SLEND A-KIT 6F 22G (SHEATH) ×1 IMPLANT
GUIDEWIRE INQWIRE 1.5J.035X260 (WIRE) IMPLANT
INQWIRE 1.5J .035X260CM (WIRE) ×3
KIT HEART LEFT (KITS) ×3 IMPLANT
PACK CARDIAC CATHETERIZATION (CUSTOM PROCEDURE TRAY) ×3 IMPLANT
TRANSDUCER W/STOPCOCK (MISCELLANEOUS) ×3 IMPLANT
TUBING CIL FLEX 10 FLL-RA (TUBING) ×3 IMPLANT

## 2017-10-09 NOTE — Interval H&P Note (Signed)
Cath Lab Visit (complete for each Cath Lab visit)  Clinical Evaluation Leading to the Procedure:   ACS: Yes.    Non-ACS:    Anginal Classification: CCS III  Anti-ischemic medical therapy: Maximal Therapy (2 or more classes of medications)  Non-Invasive Test Results: No non-invasive testing performed  Prior CABG: Previous CABG      History and Physical Interval Note:  10/09/2017 10:26 AM  Isabel [name]  has presented today for surgery, with the diagnosis of cm  The various methods of treatment have been discussed with the patient and family. After consideration of risks, benefits and other options for treatment, the patient has consented to  Procedure(s): LEFT HEART CATH AND CORS/GRAFTS ANGIOGRAPHY (N/A) as a surgical intervention .  The patient's history has been reviewed, patient examined, no change in status, stable for surgery.  I have reviewed the patient's chart and labs.  Questions were answered to the patient's satisfaction.     Lyn Records III

## 2017-10-09 NOTE — Progress Notes (Signed)
Inpatient Diabetes Program Recommendations  AACE/ADA: New Consensus Statement on Inpatient Glycemic Control (2015)  Target Ranges:  Prepandial:   less than 140 mg/dL      Peak postprandial:   less than 180 mg/dL (1-2 hours)      Critically ill patients:  140 - 180 mg/dL   Lab Results  Component Value Date   GLUCAP 238 (H) 10/09/2017   HGBA1C 10.9 (H) 05/22/2017   Review of Glycemic Control  Diabetes history: DM 2 Outpatient Diabetes medications: Levemir 50 units, Humalog 0-20 units tid Current orders for Inpatient glycemic control: None   Inpatient Diabetes Program Recommendations:    Novolog 10 units given for glucose of 473  Mg/dl Consider Novolog Moderate Correction 0-15 units Q4 hours.  Thanks,  Christena Deem RN, MSN, BC-ADM, [facility] Inpatient Diabetes Coordinator Team Pager [phone] (8a-5p)

## 2017-10-09 NOTE — Discharge Instructions (Signed)

## 2017-10-09 NOTE — Progress Notes (Signed)
Pt was given potassium.  She was advised that her procedure will be delayed due to an emergent patient.  Her family is being updated of the delay.

## 2017-10-10 MED FILL — Heparin Sod (Porcine)-NaCl IV Soln 1000 Unit/500ML-0.9%: INTRAVENOUS | Qty: 500 | Status: AC

## 2017-10-31 ENCOUNTER — Inpatient Hospital Stay (HOSPITAL_COMMUNITY): Admit: 2017-10-31 | Payer: Self-pay

## 2017-10-31 ENCOUNTER — Encounter (HOSPITAL_COMMUNITY): Payer: Self-pay

## 2017-10-31 ENCOUNTER — Encounter: Payer: Medicaid Other | Admitting: Vascular Surgery

## 2017-11-04 NOTE — Progress Notes (Deleted)
Cardiology Office Note    Date:  11/04/2017   ID:  Akeiba, Lubrano 1965-10-08, MRN [id number]  PCP:  [name], MD  Cardiologist: Prentice Docker, MD    No chief complaint on file.   History of Present Illness:    Isabel [name] is a 52 y.o. female with past medical history of CAD (s/p CABG in 2015 with LIMA-LAD, SVG-OM-LCx, and reverse SVG-dRCA), PAD (s/p left fem-pop bypass with left 5th ray amputation in 08/2015, stenting of right SFA in 08/2017), HTN, and HLD who presents to the office today for one-month follow-up.  She was last examined by Jacolyn Reedy, PA-C on 10/06/2017 for hospital follow-up in regards to admission in 08/2017 for right great toe osteomyelitis for which she required stenting and angioplasty to the right SFA.  She had been admitted 3 days later for worsening lower extremity edema and an echocardiogram at that time showed her EF was reduced to 25 to 30%.  At the time of her visit, she denied any recent chest pain or dyspnea on exertion.  Reported having recently finished her antibiotic therapy for osteomyelitis.  With her now reduced EF, findings were reviewed with Dr. Purvis Sheffield and it was recommended to proceed with a cardiac catheterization for definitive evaluation.  This was performed by Dr. Katrinka Blazing on 10/09/2017 and showed patent but diffusely diseased SVG to RCA and SVG to LCx with 60 to 70% stenosis along the vein grafts with no focal high grade obstruction noted. LIMA to LAD was patent. Continued aggressive risk factor modification was recommended with consideration of chronic dual antiplatelet therapy.     Past Medical History:  Diagnosis Date  . Anxiety   . CHF (congestive heart failure) (HCC)   . Coronary artery disease    a. s/p CABG in 2015 with LIMA-LAD, seq reverse SVG-OM-dLCx, and reverse SVG-dRCA  . Diabetes mellitus without complication (HCC)    Type 2  . GERD (gastroesophageal reflux disease)   . History of kidney stones   . Peripheral  vascular disease [facility])     Past Surgical History:  Procedure Laterality Date  . ABDOMINAL AORTOGRAM W/LOWER EXTREMITY N/A 08/28/2017   Procedure: ABDOMINAL AORTOGRAM W/LOWER EXTREMITY;  Surgeon: Maeola Harman, MD;  Location: [facility] INVASIVE CV LAB;  Service: Cardiovascular;  Laterality: N/A;  . AMPUTATION Left 08/18/2015   Procedure: Left Foot 4th and 5th Ray Amputation With [facility] Placement;  Surgeon: Nadara Mustard, MD;  Location: [facility] OR;  Service: Orthopedics;  Laterality: Left;  . AMPUTATION TOE Left 08/03/2015   Procedure: AMPUTATION LEFT FIFTH TOE;  Surgeon: Fransisco Hertz, MD;  Location: [facility] OR;  Service: Vascular;  Laterality: Left;  . BACK SURGERY    . CORONARY ANGIOPLASTY WITH STENT PLACEMENT    . CORONARY ARTERY BYPASS GRAFT N/A 06/08/2013   Procedure: CORONARY ARTERY BYPASS GRAFTING (CABG) times four using left internal mammary and right saphenous vein.;  Surgeon: Delight Ovens, MD;  Location: [facility] OR;  Service: Open Heart Surgery;  Laterality: N/A;  . ENDARTERECTOMY FEMORAL Left 08/03/2015   Procedure: ENDARTERECTOMY LEFT ILIO-FEMORAL PROFUNDA;  Surgeon: Fransisco Hertz, MD;  Location: [facility] OR;  Service: Vascular;  Laterality: Left;  . FEMORAL-POPLITEAL BYPASS GRAFT Left 08/03/2015   Procedure: BYPASS GRAFT  LEFT FEMORAL TO  BELOW KNEE POPLITEAL ARTERY USING X 80CM GORE PROPATEN GRAFT;  Surgeon: Fransisco Hertz, MD;  Location: [facility] OR;  Service: Vascular;  Laterality: Left;  . INTRAOPERATIVE TRANSESOPHAGEAL ECHOCARDIOGRAM N/A 06/08/2013  Procedure: INTRAOPERATIVE TRANSESOPHAGEAL ECHOCARDIOGRAM;  Surgeon: Delight Ovens, MD;  Location: [facility] OR;  Service: Open Heart Surgery;  Laterality: N/A;  . KIDNEY STONE SURGERY    . LEFT AND RIGHT HEART CATHETERIZATION WITH CORONARY ANGIOGRAM N/A 06/07/2013   Procedure: LEFT AND RIGHT HEART CATHETERIZATION WITH CORONARY ANGIOGRAM;  Surgeon: Marykay Lex, MD;  Location: [facility] CATH LAB;  Service: Cardiovascular;  Laterality: N/A;  . LEFT HEART CATH AND  CORS/GRAFTS ANGIOGRAPHY N/A 10/09/2017   Procedure: LEFT HEART CATH AND CORS/GRAFTS ANGIOGRAPHY;  Surgeon: Lyn Records, MD;  Location: [facility] INVASIVE CV LAB;  Service: Cardiovascular;  Laterality: N/A;  . LOWER EXTREMITY ANGIOGRAM Bilateral 07/27/2015   Procedure: Lower Extremity Angiogram;  Surgeon: Chuck Hint, MD;  Location: [facility] INVASIVE CV LAB;  Service: Cardiovascular;  Laterality: Bilateral;  . PATCH ANGIOPLASTY Left 08/03/2015   Procedure: PATCH ANGIOPLASTY RIGHT ILIO-FEMORAL PROFUNDA USING XENOSURE BIOLOGIC PATCH;  Surgeon: Fransisco Hertz, MD;  Location: [facility] OR;  Service: Vascular;  Laterality: Left;  . PERIPHERAL VASCULAR CATHETERIZATION N/A 07/27/2015   Procedure: Abdominal Aortogram;  Surgeon: Chuck Hint, MD;  Location: [facility] INVASIVE CV LAB;  Service: Cardiovascular;  Laterality: N/A;  . PERIPHERAL VASCULAR INTERVENTION Right 08/28/2017   Procedure: PERIPHERAL VASCULAR INTERVENTION;  Surgeon: Maeola Harman, MD;  Location: [facility] INVASIVE CV LAB;  Service: Cardiovascular;  Laterality: Right;  . SALPINGOOPHORECTOMY     "? side"  . TONSILLECTOMY    . TUBAL LIGATION    . ULTRASOUND GUIDANCE FOR VASCULAR ACCESS  10/09/2017   Procedure: Ultrasound Guidance For Vascular Access;  Surgeon: Lyn Records, MD;  Location: [facility] INVASIVE CV LAB;  Service: Cardiovascular;;    Current Medications: Outpatient Medications Prior to Visit  Medication Sig Dispense Refill  . acetaminophen (TYLENOL) 325 MG tablet Take 650 mg by mouth every 6 (six) hours as needed for mild pain or headache.    . ALPRAZolam (XANAX) 1 MG tablet Take 1 mg by mouth 4 (four) times daily as needed for anxiety.     Marland Kitchen aspirin EC 81 MG tablet Take 81 mg by mouth daily.    Marland Kitchen atorvastatin (LIPITOR) 80 MG tablet Take 1 tablet (80 mg total) by mouth daily at 6 PM. 30 tablet 1  . carvedilol (COREG) 3.125 MG tablet Take 3.125 mg by mouth 2 (two) times daily.    . carvedilol (COREG) 6.25 MG tablet Take 1 tablet (6.25 mg  total) by mouth 2 (two) times daily. (Patient not taking: Reported on 10/08/2017) 60 tablet 2  . clopidogrel (PLAVIX) 75 MG tablet Take 1 tablet (75 mg total) by mouth daily with breakfast. 30 tablet 2  . diphenhydrAMINE (BENADRYL) 25 mg capsule Take 25 mg by mouth daily as needed for allergies.    Marland Kitchen ezetimibe (ZETIA) 10 MG tablet Take 1 tablet (10 mg total) by mouth daily. 90 tablet 3  . furosemide (LASIX) 40 MG tablet Take 1 tablet (40 mg total) by mouth daily. 30 tablet 3  . HUMALOG KWIKPEN 100 UNIT/ML KiwkPen Inject 2-20 Units into the skin 3 (three) times daily before meals. Per sliding scale. For high sugar    . Insulin Detemir (LEVEMIR) 100 UNIT/ML Pen Take 15 units at once daily nighttime for now. Check your glucose levels and increase by 2 units if morning levels stay above 200. And then talk to your PCP. (Patient taking differently: Inject 50 Units into the skin at bedtime. Increase by 5 units every 2 days until back at 100 units daily) 3  mL 2  . omeprazole (PRILOSEC) 20 MG capsule Take 1 capsule by mouth 2 (two) times daily.    . polyethylene glycol (MIRALAX / GLYCOLAX) packet Take 17 g by mouth daily. 30 each 0  . PROAIR HFA 108 (90 BASE) MCG/ACT inhaler Inhale 2 puffs into the lungs 4 (four) times daily as needed for shortness of breath.      No facility-administered medications prior to visit.      Allergies:   Patient has no known allergies.   Social History   Socioeconomic History  . Marital status: Married    Spouse name: Not on file  . Number of children: Not on file  . Years of education: Not on file  . Highest education level: Not on file  Occupational History  . Not on file  Social Needs  . Financial resource strain: Very hard  . Food insecurity:    Worry: Patient refused    Inability: Patient refused  . Transportation needs:    Medical: Yes    Non-medical: Yes  Tobacco Use  . Smoking status: Former Smoker    Packs/day: 0.50    Years: 10.00    Pack years:  5.00    Types: Cigarettes    Start date: 12/26/1992    Last attempt to quit: 04/15/2000    Years since quitting: 17.5  . Smokeless tobacco: Never Used  . Tobacco comment: QUIT SMOKING YEARS AGO "  Substance and Sexual Activity  . Alcohol use: Yes    Alcohol/week: 0.0 oz    Comment: 07/27/2015 "might have a couple drinks twice/month"  . Drug use: No  . Sexual activity: Not on file  Lifestyle  . Physical activity:    Days per week: Not on file    Minutes per session: Not on file  . Stress: Not on file  Relationships  . Social connections:    Talks on phone: Not on file    Gets together: Not on file    Attends religious service: Not on file    Active member of club or organization: Not on file    Attends meetings of clubs or organizations: Not on file    Relationship status: Not on file  Other Topics Concern  . Not on file  Social History Narrative  . Not on file     Family History:  The patient's ***family history includes Cancer in her father; Diabetes in her father, mother, paternal aunt, and paternal grandfather; Heart disease in her brother, father, and mother; Stroke in her maternal grandmother and mother.   Review of Systems:   Please see the history of present illness.     General:  No chills, fever, night sweats or weight changes.  Cardiovascular:  No chest pain, dyspnea on exertion, edema, orthopnea, palpitations, paroxysmal nocturnal dyspnea. Dermatological: No rash, lesions/masses Respiratory: No cough, dyspnea Urologic: No hematuria, dysuria Abdominal:   No nausea, vomiting, diarrhea, bright red blood per rectum, melena, or hematemesis Neurologic:  No visual changes, wkns, changes in mental status. All other systems reviewed and are otherwise negative except as noted above.   Physical Exam:    VS:  There were no vitals taken for this visit.   General: Well developed, well nourished,female appearing in no acute distress. Head: Normocephalic, atraumatic, sclera  non-icteric, no xanthomas, nares are without discharge.  Neck: No carotid bruits. JVD not elevated.  Lungs: Respirations regular and unlabored, without wheezes or rales.  Heart: ***Regular rate and rhythm. No S3 or S4.  No murmur, no rubs, or gallops appreciated. Abdomen: Soft, non-tender, non-distended with normoactive bowel sounds. No hepatomegaly. No rebound/guarding. No obvious abdominal masses. Msk:  Strength and tone appear normal for age. No joint deformities or effusions. Extremities: No clubbing or cyanosis. No edema.  Distal pedal pulses are 2+ bilaterally. Neuro: Alert and oriented X 3. Moves all extremities spontaneously. No focal deficits noted. Psych:  Responds to questions appropriately with a normal affect. Skin: No rashes or lesions noted  Wt Readings from Last 3 Encounters:  10/09/17 151 lb (68.5 kg)  10/06/17 154 lb (69.9 kg)  09/07/17 168 lb 10.4 oz (76.5 kg)        Studies/Labs Reviewed:   EKG:  EKG is*** ordered today.  The ekg ordered today demonstrates ***  Recent Labs: 09/04/2017: B Natriuretic Peptide 1,943.8 09/05/2017: ALT 18 10/09/2017: BUN 18; Creatinine, Ser 1.04; Hemoglobin 11.4; Platelets 224; Potassium 3.2; Sodium 135   Lipid Panel    Component Value Date/Time   CHOL 278 (H) 06/05/2013 0515   TRIG 295 (H) 06/05/2013 0515   HDL 27 (L) 06/05/2013 0515   CHOLHDL 10.3 06/05/2013 0515   VLDL 59 (H) 06/05/2013 0515   LDLCALC 192 (H) 06/05/2013 0515    Additional studies/ records that were reviewed today include:   Echocardiogram: 08/2017 Study Conclusions  - Left ventricle: The cavity size was normal. Systolic function was   severely reduced. The estimated ejection fraction was in the   range of 25% to 30%. Diffuse hypokinesis. Doppler parameters are   consistent with a reversible restrictive pattern, indicative of   decreased left ventricular diastolic compliance and/or increased   left atrial pressure (grade 3 diastolic dysfunction). -  Mitral valve: Mildly calcified annulus. There was mild to   moderate regurgitation. - Left atrium: The atrium was moderately dilated. - Pulmonary arteries: Systolic pressure was mildly increased. PA   peak pressure: 39 mm Hg (S).  Cardiac Catheterization: 10/09/2017  Patent but diffusely diseased SVG to RCA and SVG to circumflex/obtuse marginal.  Each graft contains up to 60 to 70% narrowing in a diffuse fashion.  No focal high-grade obstruction is seen.  The graft to the circumflex also contains a linear dissection plane in the mid body that does not obstruct flow.  Patent LIMA to the LAD.  LAD is diffusely diseased both antegrade and retrograde to the bypass graft insertion site.  Patent diffusely diseased left main  Totally occluded ostial LAD  Occluded obtuse marginals from the circumflex.  Totally occluded proximal RCA  Global left ventricular systolic dysfunction with EF in the 35% range.  LVEDP is 15 mmHg.  Findings are compatible with chronic compensated systolic heart failure.  RECOMMENDATIONS:   Guideline determined therapy for left ventricular systolic function  Aggressive risk factor modification  Consider chronic dual antiplatelet therapy.  In absence of angina or high-grade stenoses, PCI was not indicated on saphenous vein grafts.  Unfortunately, her saphenous vein grafts are heavily diseased and are at risk of occlusion.   Assessment:    No diagnosis found.   Plan:   In order of problems listed above:  1. ***    Medication Adjustments/Labs and Tests Ordered: Current medicines are reviewed at length with the patient today.  Concerns regarding medicines are outlined above.  Medication changes, Labs and Tests ordered today are listed in the Patient Instructions below. There are no Patient Instructions on file for this visit.   Signed, Ellsworth Lennox, PA-C  11/04/2017 10:33 AM    [facility] [address] [place],  [place] [address] Phone: [phone]

## 2017-11-05 ENCOUNTER — Ambulatory Visit: Payer: Self-pay | Admitting: Student

## 2017-11-13 DEATH — deceased

## 2017-12-05 ENCOUNTER — Ambulatory Visit: Payer: Medicaid Other | Admitting: Vascular Surgery
